# Patient Record
Sex: Female | Born: 1935 | Race: White | Hispanic: No | Smoking: Former smoker
Health system: Southern US, Community
[De-identification: ages and names within clinical notes are randomized; demographics above are authoritative.]

## PROBLEM LIST (undated history)

## (undated) DIAGNOSIS — K449 Diaphragmatic hernia without obstruction or gangrene: Secondary | ICD-10-CM

## (undated) DIAGNOSIS — I10 Essential (primary) hypertension: Secondary | ICD-10-CM

## (undated) DIAGNOSIS — Z87898 Personal history of other specified conditions: Secondary | ICD-10-CM

## (undated) DIAGNOSIS — E785 Hyperlipidemia, unspecified: Secondary | ICD-10-CM

## (undated) DIAGNOSIS — Z950 Presence of cardiac pacemaker: Secondary | ICD-10-CM

## (undated) DIAGNOSIS — H9191 Unspecified hearing loss, right ear: Secondary | ICD-10-CM

## (undated) DIAGNOSIS — K5730 Diverticulosis of large intestine without perforation or abscess without bleeding: Secondary | ICD-10-CM

## (undated) DIAGNOSIS — R0989 Other specified symptoms and signs involving the circulatory and respiratory systems: Secondary | ICD-10-CM

## (undated) DIAGNOSIS — C50919 Malignant neoplasm of unspecified site of unspecified female breast: Secondary | ICD-10-CM

## (undated) DIAGNOSIS — C4359 Malignant melanoma of other part of trunk: Secondary | ICD-10-CM

## (undated) DIAGNOSIS — I441 Atrioventricular block, second degree: Secondary | ICD-10-CM

## (undated) HISTORY — DX: Personal history of other specified conditions: Z87.898

## (undated) HISTORY — DX: Atrioventricular block, second degree: I44.1

## (undated) HISTORY — DX: Diverticulosis of large intestine without perforation or abscess without bleeding: K57.30

## (undated) HISTORY — DX: Other specified symptoms and signs involving the circulatory and respiratory systems: R09.89

## (undated) HISTORY — DX: Essential (primary) hypertension: I10

## (undated) HISTORY — DX: Unspecified hearing loss, right ear: H91.91

## (undated) HISTORY — DX: Hyperlipidemia, unspecified: E78.5

## (undated) HISTORY — DX: Diaphragmatic hernia without obstruction or gangrene: K44.9

## (undated) HISTORY — PX: OTHER SURGICAL HISTORY: SHX169

## (undated) HISTORY — DX: Malignant melanoma of other part of trunk: C43.59

## (undated) HISTORY — DX: Presence of cardiac pacemaker: Z95.0

## (undated) HISTORY — PX: INSERT / REPLACE / REMOVE PACEMAKER: SUR710

---

## 1958-02-27 HISTORY — PX: BREAST BIOPSY: SHX20

## 1958-02-27 HISTORY — PX: BREAST LUMPECTOMY: SHX2

## 1975-02-28 HISTORY — PX: OTHER SURGICAL HISTORY: SHX169

## 1997-07-14 ENCOUNTER — Other Ambulatory Visit: Admission: RE | Admit: 1997-07-14 | Discharge: 1997-07-14 | Payer: Self-pay | Admitting: Internal Medicine

## 1997-07-14 LAB — HM PAP SMEAR

## 1999-02-28 HISTORY — PX: CATARACT EXTRACTION, BILATERAL: SHX1313

## 1999-06-30 ENCOUNTER — Encounter: Admission: RE | Admit: 1999-06-30 | Discharge: 1999-06-30 | Payer: Self-pay | Admitting: Internal Medicine

## 1999-06-30 ENCOUNTER — Encounter: Payer: Self-pay | Admitting: Internal Medicine

## 1999-08-09 ENCOUNTER — Ambulatory Visit (HOSPITAL_COMMUNITY): Admission: RE | Admit: 1999-08-09 | Discharge: 1999-08-09 | Payer: Self-pay | Admitting: Internal Medicine

## 1999-08-09 ENCOUNTER — Encounter: Payer: Self-pay | Admitting: Internal Medicine

## 2000-02-28 HISTORY — PX: CATARACT EXTRACTION: SUR2

## 2000-03-15 ENCOUNTER — Encounter: Admission: RE | Admit: 2000-03-15 | Discharge: 2000-03-15 | Payer: Self-pay | Admitting: Internal Medicine

## 2000-03-15 ENCOUNTER — Encounter: Payer: Self-pay | Admitting: Internal Medicine

## 2000-05-10 ENCOUNTER — Other Ambulatory Visit: Admission: RE | Admit: 2000-05-10 | Discharge: 2000-05-10 | Payer: Self-pay | Admitting: Internal Medicine

## 2000-05-10 ENCOUNTER — Encounter: Payer: Self-pay | Admitting: Internal Medicine

## 2000-05-10 ENCOUNTER — Encounter (INDEPENDENT_AMBULATORY_CARE_PROVIDER_SITE_OTHER): Payer: Self-pay | Admitting: Specialist

## 2000-05-10 DIAGNOSIS — K648 Other hemorrhoids: Secondary | ICD-10-CM | POA: Insufficient documentation

## 2000-05-10 DIAGNOSIS — D126 Benign neoplasm of colon, unspecified: Secondary | ICD-10-CM | POA: Insufficient documentation

## 2000-08-13 ENCOUNTER — Encounter: Admission: RE | Admit: 2000-08-13 | Discharge: 2000-08-13 | Payer: Self-pay | Admitting: Internal Medicine

## 2000-08-13 ENCOUNTER — Encounter: Payer: Self-pay | Admitting: Internal Medicine

## 2001-10-17 ENCOUNTER — Encounter: Admission: RE | Admit: 2001-10-17 | Discharge: 2001-10-17 | Payer: Self-pay | Admitting: Internal Medicine

## 2001-10-17 ENCOUNTER — Encounter: Payer: Self-pay | Admitting: Internal Medicine

## 2002-12-04 ENCOUNTER — Encounter: Admission: RE | Admit: 2002-12-04 | Discharge: 2002-12-04 | Payer: Self-pay | Admitting: Internal Medicine

## 2002-12-04 ENCOUNTER — Encounter: Payer: Self-pay | Admitting: Internal Medicine

## 2003-01-12 ENCOUNTER — Encounter: Admission: RE | Admit: 2003-01-12 | Discharge: 2003-01-12 | Payer: Self-pay | Admitting: Internal Medicine

## 2003-11-19 ENCOUNTER — Ambulatory Visit (HOSPITAL_COMMUNITY): Admission: RE | Admit: 2003-11-19 | Discharge: 2003-11-19 | Payer: Self-pay | Admitting: Internal Medicine

## 2003-11-20 ENCOUNTER — Encounter: Admission: RE | Admit: 2003-11-20 | Discharge: 2003-11-20 | Payer: Self-pay | Admitting: Internal Medicine

## 2004-01-28 ENCOUNTER — Encounter: Admission: RE | Admit: 2004-01-28 | Discharge: 2004-01-28 | Payer: Self-pay | Admitting: Internal Medicine

## 2004-02-28 HISTORY — PX: PACEMAKER INSERTION: SHX728

## 2004-04-01 ENCOUNTER — Ambulatory Visit (HOSPITAL_COMMUNITY): Admission: RE | Admit: 2004-04-01 | Discharge: 2004-04-02 | Payer: Self-pay | Admitting: Cardiology

## 2004-04-01 DIAGNOSIS — Z950 Presence of cardiac pacemaker: Secondary | ICD-10-CM

## 2004-04-01 HISTORY — DX: Presence of cardiac pacemaker: Z95.0

## 2005-02-16 ENCOUNTER — Encounter: Admission: RE | Admit: 2005-02-16 | Discharge: 2005-02-16 | Payer: Self-pay | Admitting: Internal Medicine

## 2005-07-12 ENCOUNTER — Encounter: Admission: RE | Admit: 2005-07-12 | Discharge: 2005-07-12 | Payer: Self-pay | Admitting: Cardiology

## 2006-01-15 ENCOUNTER — Ambulatory Visit: Payer: Self-pay | Admitting: Internal Medicine

## 2006-01-15 LAB — CONVERTED CEMR LAB
ALT: 18 units/L (ref 0–40)
AST: 19 units/L (ref 0–37)
Albumin: 4.1 g/dL (ref 3.5–5.2)
Alkaline Phosphatase: 66 units/L (ref 39–117)
BUN: 24 mg/dL — ABNORMAL HIGH (ref 6–23)
Basophils Absolute: 0.1 10*3/uL (ref 0.0–0.1)
Basophils Relative: 0.7 % (ref 0.0–1.0)
Bilirubin Urine: NEGATIVE
CO2: 31 meq/L (ref 19–32)
Calcium: 9.4 mg/dL (ref 8.4–10.5)
Chloride: 103 meq/L (ref 96–112)
Creatinine, Ser: 1 mg/dL (ref 0.4–1.2)
Crystals: NEGATIVE
Eosinophil percent: 1.1 % (ref 0.0–5.0)
GFR calc non Af Amer: 56 mL/min
Glomerular Filtration Rate, Af Am: 67 mL/min/{1.73_m2}
Glucose, Bld: 94 mg/dL (ref 70–99)
HCT: 44.4 % (ref 36.0–46.0)
Hemoglobin: 14.7 g/dL (ref 12.0–15.0)
Ketones, ur: NEGATIVE mg/dL
Lipase: 29 units/L (ref 11.0–59.0)
Lymphocytes Relative: 20.3 % (ref 12.0–46.0)
MCHC: 33.2 g/dL (ref 30.0–36.0)
MCV: 89.5 fL (ref 78.0–100.0)
Monocytes Absolute: 0.6 10*3/uL (ref 0.2–0.7)
Monocytes Relative: 8.3 % (ref 3.0–11.0)
Mucus, UA: NEGATIVE
Neutro Abs: 5.3 10*3/uL (ref 1.4–7.7)
Neutrophils Relative %: 69.6 % (ref 43.0–77.0)
Nitrite: NEGATIVE
Platelets: 207 10*3/uL (ref 150–400)
Potassium: 3.7 meq/L (ref 3.5–5.1)
RBC: 4.96 M/uL (ref 3.87–5.11)
RDW: 12.5 % (ref 11.5–14.6)
Sodium: 143 meq/L (ref 135–145)
Specific Gravity, Urine: 1.02 (ref 1.000–1.03)
Total Bilirubin: 0.9 mg/dL (ref 0.3–1.2)
Total Protein, Urine: NEGATIVE mg/dL
Total Protein: 6.7 g/dL (ref 6.0–8.3)
Urine Glucose: NEGATIVE mg/dL
Urobilinogen, UA: 0.2 (ref 0.0–1.0)
WBC: 7.6 10*3/uL (ref 4.5–10.5)
pH: 5.5 (ref 5.0–8.0)

## 2006-01-23 ENCOUNTER — Ambulatory Visit: Payer: Self-pay | Admitting: Gastroenterology

## 2006-01-31 ENCOUNTER — Ambulatory Visit: Payer: Self-pay | Admitting: Internal Medicine

## 2006-02-05 ENCOUNTER — Ambulatory Visit: Payer: Self-pay | Admitting: Cardiology

## 2006-02-22 ENCOUNTER — Encounter: Admission: RE | Admit: 2006-02-22 | Discharge: 2006-02-22 | Payer: Self-pay | Admitting: Internal Medicine

## 2006-02-22 ENCOUNTER — Ambulatory Visit: Payer: Self-pay | Admitting: Internal Medicine

## 2006-03-09 ENCOUNTER — Ambulatory Visit (HOSPITAL_COMMUNITY): Admission: RE | Admit: 2006-03-09 | Discharge: 2006-03-09 | Payer: Self-pay | Admitting: Internal Medicine

## 2006-04-30 ENCOUNTER — Ambulatory Visit: Payer: Self-pay | Admitting: Internal Medicine

## 2006-07-20 ENCOUNTER — Encounter: Admission: RE | Admit: 2006-07-20 | Discharge: 2006-07-20 | Payer: Self-pay | Admitting: Cardiology

## 2006-08-07 ENCOUNTER — Ambulatory Visit: Payer: Self-pay | Admitting: Internal Medicine

## 2006-08-07 LAB — CONVERTED CEMR LAB
BUN: 30 mg/dL — ABNORMAL HIGH (ref 6–23)
Creatinine, Ser: 1 mg/dL (ref 0.4–1.2)

## 2006-08-08 ENCOUNTER — Ambulatory Visit: Payer: Self-pay | Admitting: Cardiology

## 2006-08-21 ENCOUNTER — Ambulatory Visit: Payer: Self-pay | Admitting: Internal Medicine

## 2007-02-28 DIAGNOSIS — C4359 Malignant melanoma of other part of trunk: Secondary | ICD-10-CM

## 2007-02-28 HISTORY — DX: Malignant melanoma of other part of trunk: C43.59

## 2007-06-13 ENCOUNTER — Ambulatory Visit: Payer: Self-pay | Admitting: Surgery

## 2007-06-13 ENCOUNTER — Ambulatory Visit (HOSPITAL_COMMUNITY): Admission: RE | Admit: 2007-06-13 | Discharge: 2007-06-13 | Payer: Self-pay | Admitting: Internal Medicine

## 2007-06-13 ENCOUNTER — Encounter: Payer: Self-pay | Admitting: Internal Medicine

## 2007-06-21 DIAGNOSIS — K449 Diaphragmatic hernia without obstruction or gangrene: Secondary | ICD-10-CM | POA: Insufficient documentation

## 2007-06-21 DIAGNOSIS — E7800 Pure hypercholesterolemia, unspecified: Secondary | ICD-10-CM | POA: Insufficient documentation

## 2007-06-21 DIAGNOSIS — K5730 Diverticulosis of large intestine without perforation or abscess without bleeding: Secondary | ICD-10-CM | POA: Insufficient documentation

## 2007-10-29 ENCOUNTER — Encounter: Admission: RE | Admit: 2007-10-29 | Discharge: 2007-10-29 | Payer: Self-pay | Admitting: Internal Medicine

## 2008-02-11 ENCOUNTER — Ambulatory Visit: Payer: Self-pay | Admitting: Internal Medicine

## 2008-06-22 IMAGING — US US ABDOMEN COMPLETE
1 series · 14 of 25 positions shown · non-contrast
Comparison: none

[Series 1: us abdomen complete · 0.18mm/px · 14 of 55 slices shown]
[im 1/55]
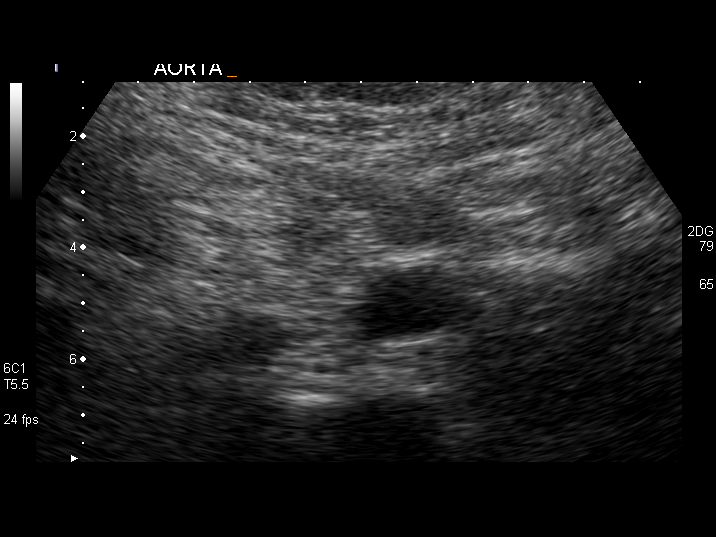
[im 5/55]
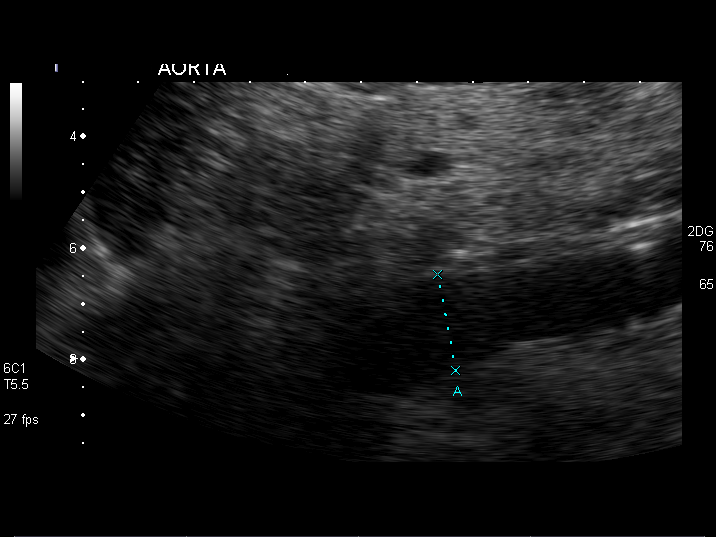
[im 10/55]
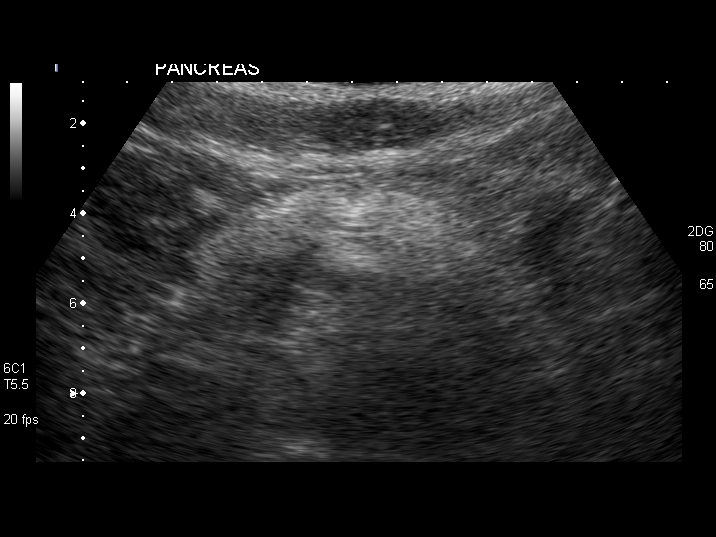
[im 14/55]
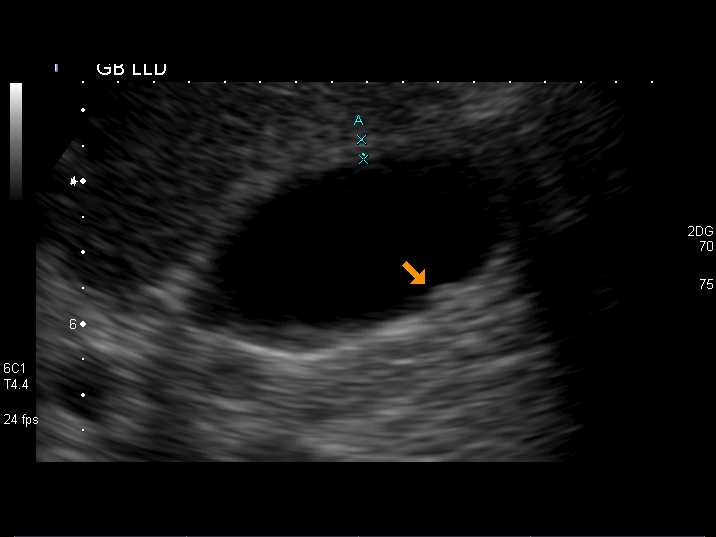
[im 19/55]
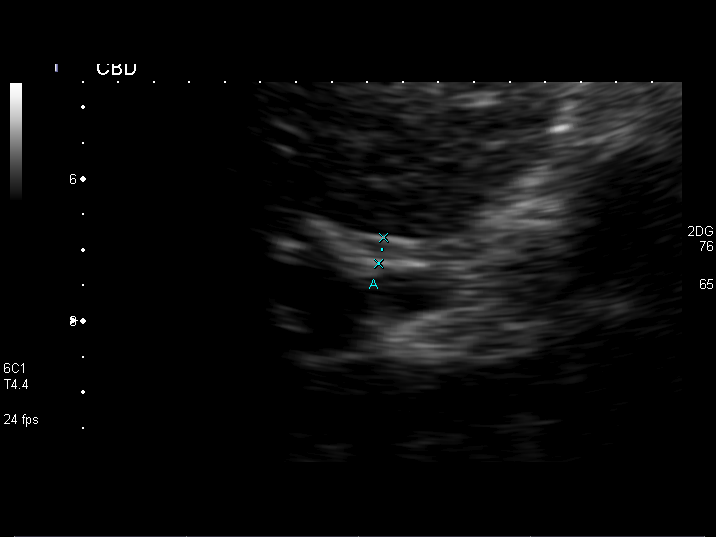
[im 21/55]
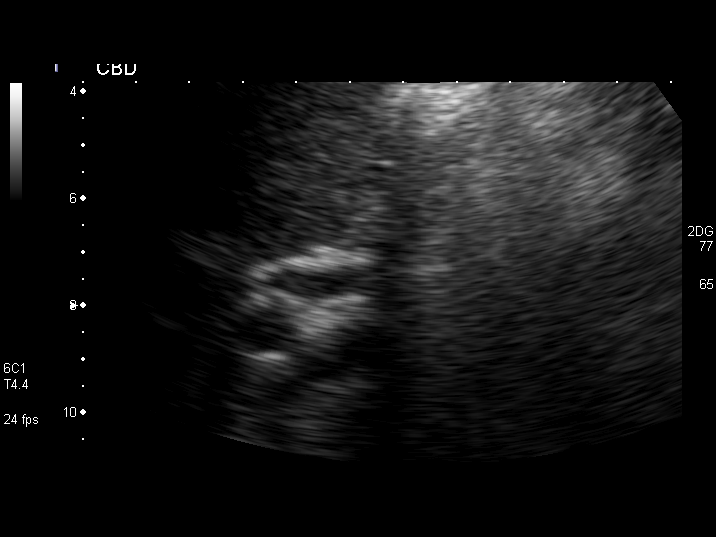
[im 25/55]
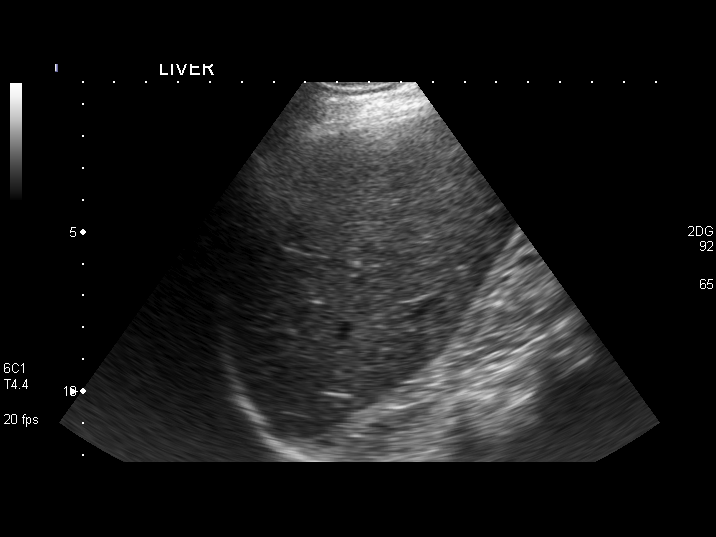
[im 30/55]
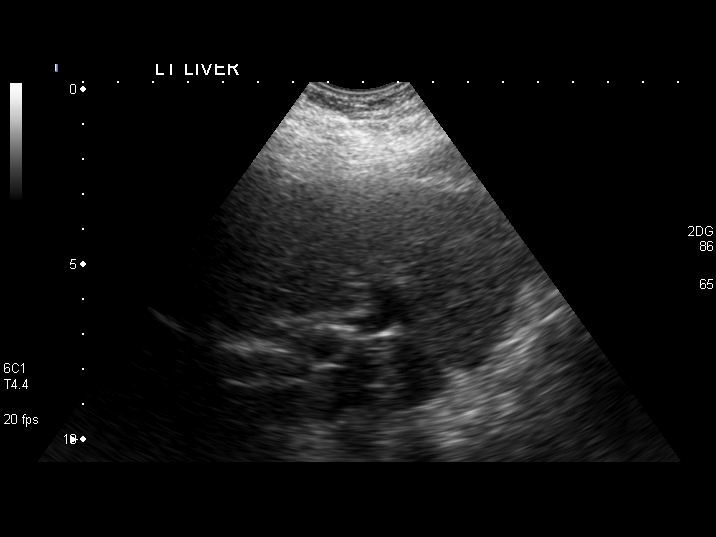
[im 34/55]
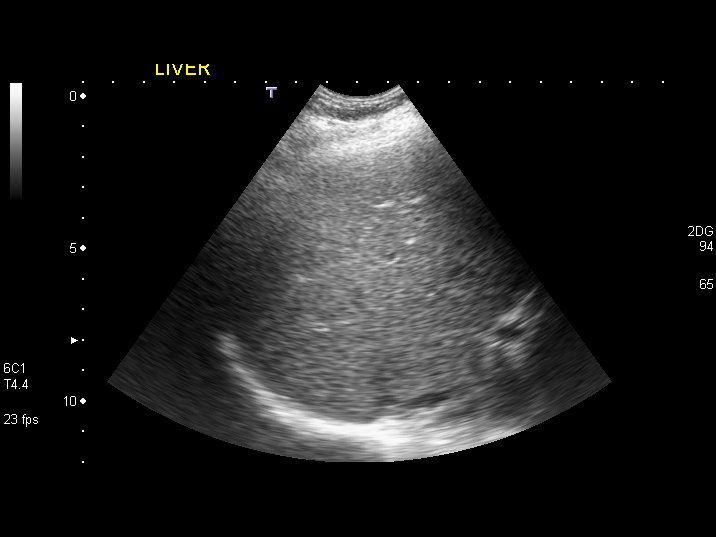
[im 37/55]
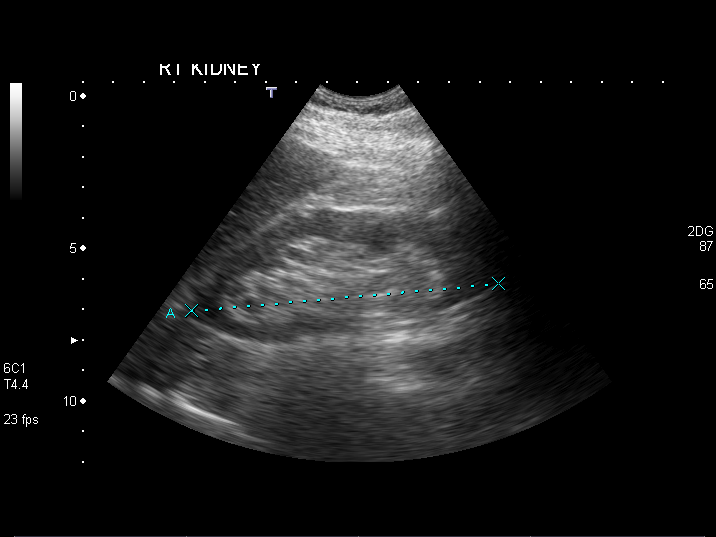
[im 41/55]
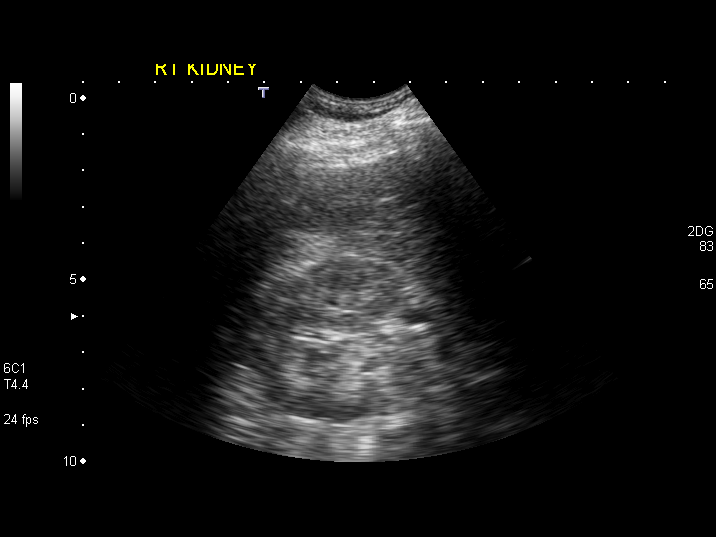
[im 46/55]
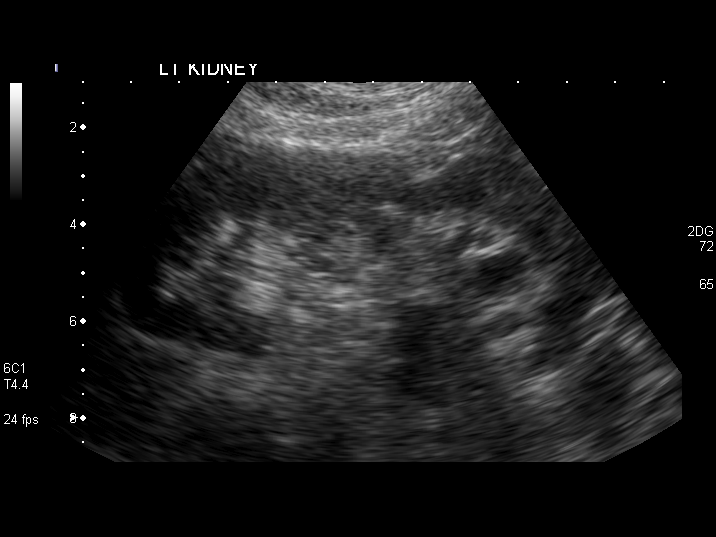
[im 50/55]
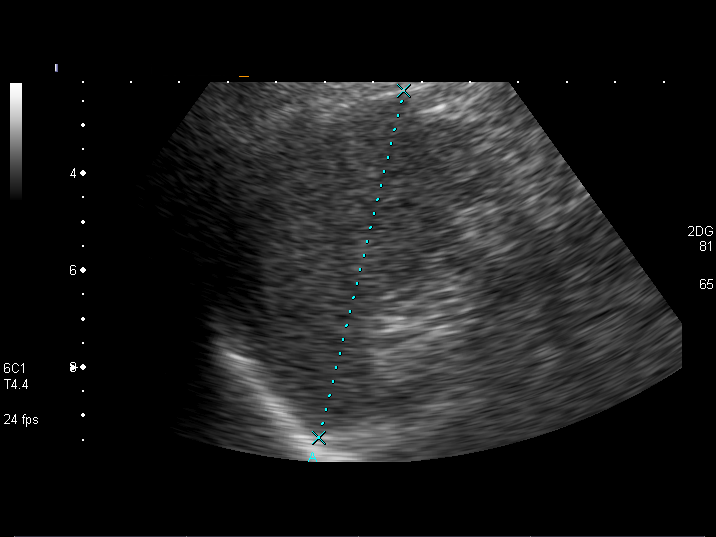
[im 55/55]
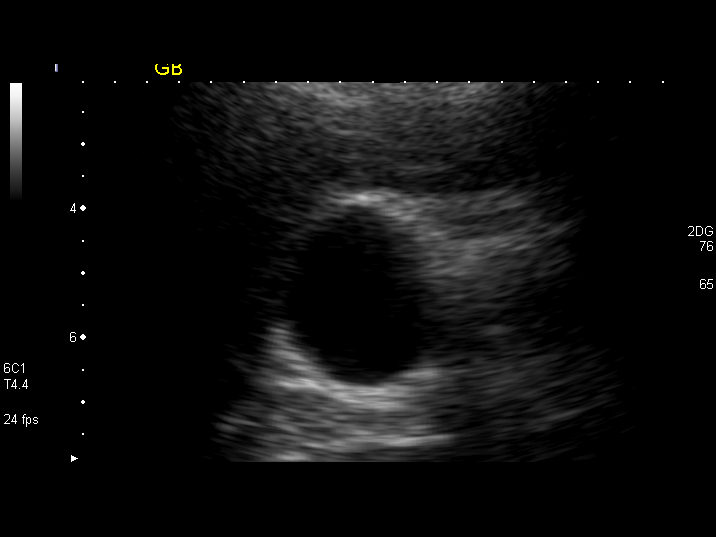

[14 of 25 positions shown; findings below may reference images not displayed]

ACCESSION NUMBER:  [id number]

 READING PHYSICIAN:  Sebas, Latonya

 PROCEDURE:  Multiplanar abdominal ultrasound imaging was performed in the upright, supine, right and left lateral decubitus positions.

 RESULTS
 Abdominal aorta measures 1.9 cm in maximal diameter.  The IVC is patent. 

 The pancreas appears normal throughout the head, body and tail without evidence of ductal dilatation, pancreatic masses, or peripancreatic inflammation.  

 In the gallbladder, a small amount of nonshadowing sludge is noted.  The wall thickness measures at 2.8 mm.  Gallbladder is well distended, thin walled, with no pericholecystic fluid or intraluminal echogenic foci to suggest gallstone disease.  

 The common bile duct measures 7.2 mm in maximal diameter without evidence of intraluminal foci.

 The liver appears normal without evidence of parenchymal lesion, ductal dilatation or vascular abnormality.

 The right kidney measures 10.1 cm, the left kidney 9.2 cm.  Kidneys are normal in appearance.

 Spleen is normal in size, measuring 7.4 cm without parenchymal lesion.

 IMPRESSION 
 Small amount of gallbladder sludge.

## 2008-07-21 ENCOUNTER — Encounter: Admission: RE | Admit: 2008-07-21 | Discharge: 2008-07-21 | Payer: Self-pay | Admitting: Cardiology

## 2008-08-11 ENCOUNTER — Ambulatory Visit: Payer: Self-pay | Admitting: Internal Medicine

## 2008-11-20 ENCOUNTER — Encounter: Admission: RE | Admit: 2008-11-20 | Discharge: 2008-11-20 | Payer: Self-pay | Admitting: Internal Medicine

## 2008-12-24 ENCOUNTER — Ambulatory Visit: Payer: Self-pay | Admitting: Internal Medicine

## 2009-02-08 ENCOUNTER — Ambulatory Visit: Payer: Self-pay | Admitting: Internal Medicine

## 2009-04-20 ENCOUNTER — Ambulatory Visit: Payer: Self-pay | Admitting: Internal Medicine

## 2009-10-19 ENCOUNTER — Ambulatory Visit: Payer: Self-pay | Admitting: Internal Medicine

## 2009-12-21 ENCOUNTER — Encounter: Admission: RE | Admit: 2009-12-21 | Discharge: 2009-12-21 | Payer: Self-pay | Admitting: Internal Medicine

## 2009-12-21 LAB — HM MAMMOGRAPHY

## 2010-03-19 ENCOUNTER — Encounter: Payer: Self-pay | Admitting: Internal Medicine

## 2010-03-20 ENCOUNTER — Encounter: Payer: Self-pay | Admitting: Internal Medicine

## 2010-04-19 ENCOUNTER — Other Ambulatory Visit: Payer: Self-pay | Admitting: Internal Medicine

## 2010-04-21 ENCOUNTER — Encounter: Payer: Self-pay | Admitting: Internal Medicine

## 2010-04-21 DIAGNOSIS — I10 Essential (primary) hypertension: Secondary | ICD-10-CM

## 2010-04-21 DIAGNOSIS — M159 Polyosteoarthritis, unspecified: Secondary | ICD-10-CM

## 2010-04-21 DIAGNOSIS — E785 Hyperlipidemia, unspecified: Secondary | ICD-10-CM

## 2010-05-19 ENCOUNTER — Encounter: Payer: Self-pay | Admitting: Internal Medicine

## 2010-05-23 ENCOUNTER — Encounter: Payer: Self-pay | Admitting: Internal Medicine

## 2010-07-12 NOTE — Assessment & Plan Note (Signed)
[facility] OFFICE NOTE   NAME:GREENIisha, [place]                        MRN:          [id number]  DATE:08/21/2006                            DOB:          1916/10/31    HISTORY:  Ms. Goudeau presents today for followup.  She is an 77 year old  who has been seen for problems with intermittent nocturnal abdominal  discomfort.  She is also known to have a small cystic lesion of the  pancreas which has not been characterized.  For her discomfort, she uses  Darvocet sparingly.  This helps.  Her last office visit was in early  March of 2008.  Since that time, she has been stable.  She continues to  have intermittent nocturnal abdominal discomfort that she describes as a  dull pain.  No other symptoms such as nausea, vomiting, diarrhea, fever,  bleeding.  Her appetite and weight remain stable.  As per our previous  plan, she underwent a followup CT scan August 08, 2006.  This revealed the  cystic lesion of the pancreas to be stable.  It currently measures 2.8  cm x 1.4 cm.  The main pancreatic duct is not dilated.  She is wondering  whether her symptoms may be due to Fosamax.   CURRENT MEDICATIONS:  Fosamax, aspirin, pravastatin,  hydrochlorothiazide, lisinopril, glucosamine, calcium, and stool  softeners.  She also uses MiraLax p.r.n., Tylenol p.r.n., Levsin p.r.n.,  and Zantac p.r.n., as well as Darvocet p.r.n.   PHYSICAL EXAMINATION:  Well-appearing female in no acute distress.  Blood pressure is 156/64.  Heart rate is 80 and regular.  Weight is  132.2 pounds.  HEENT:  Sclerae are anicteric.  Conjunctivae are pink.  LUNGS:  Clear.  HEART:  Regular.  ABDOMEN:  Soft without tenderness, mass, or hernia.   IMPRESSION:  Stable intermittent abdominal discomfort possibly related  to small cystic lesion of the pancreas.  She is clinically and  radiographically stable.  We again discussed possible etiologies as well  as the  prospects of endoscopic ultrasound guided aspiration to provide  additional diagnostic information.  Given her age and the location of  the cyst, we are both keen on continuing with conservative management  and Darvocet as needed for pain.  I would like to see her back in the  office in about 6 months with probable plans for re-imaging the  pancreas.  Certainly, if her pain worsens or increases in frequency, in  the interim, she knows to contact the office.     Docia Chuck. Henrene Pastor, MD  Electronically Signed    JNP/MedQ  DD: 08/21/2006  DT: 08/22/2006  Job #: [id number]   cc:   Cresenciano Lick. Renold Genta, M.D.

## 2010-07-15 NOTE — Cardiovascular Report (Signed)
[name], [name] [name]                 ACCOUNT NO.:  [id number]   MEDICAL RECORD NO.:  [id number]          PATIENT TYPE:  OIB   LOCATION:  3703                         FACILITY:  [facility]   PHYSICIAN:  Ludwig Lean. Doreatha Lew, M.D.DATE OF BIRTH:  02-Apr-1916   DATE OF PROCEDURE:  DATE OF DISCHARGE:                              CARDIAC CATHETERIZATION   INDICATION:  Syncope with Mobitz 2 heart block, documented by monitoring.   PROCEDURE:  Implantation of a dual-chamber pulse generator under  fluoroscopy.   DESCRIPTION OF PROCEDURE:  The left subclavicular area was prepped and  draped.  The area was infiltrated with 1% Xylocaine.  A subcutaneous pocket  was created at the prepectoral fascia.  Two punctures were made into the  subclavian vein medial to the first rib.  Using 7-[facility] introducers,  atrial and ventricular leads were introduced.  The ventricular lead was a  Guidant model 4470 52 cm bipolar lead, serial number [id number].  The  ventricular threshold was 0.8 volts.  The capture 1.0 MA current was 0.5 ms  pulse width.  Impedance was 936 ohms.  R waves were 11.7 millivolts.  The  atrial lead was a Guidant bipolar active fixation lead, serial [id number].  It was a Model 4469 lead.  The atrial thresholds were 0.6 volts to capture,  1.4 MA current with a 0.5 ms pulse width.  Impedance was 482 ohms.  P waves  were 1.9 millivolts.  The leads were sutured in place.  The wound was  flushed with Kanamycin solution.  The leads were connected to a Medtronic  EnPulse Model number [id number], serial number PNB [id number].  The unit was  sutured into place.  The wound was closed with 2-O and subsequently 5-O  Dexon and Steri-Strips were applied.  The patient tolerated the procedure  well.      SNT/MEDQ  D:  04/01/2004  T:  04/02/2004  Job:  [id number]   cc:   Kerry Hough., M.D.  [id number] [address] [place]  [place] [address]  Fax: [phone]

## 2010-07-15 NOTE — H&P (Signed)
NAME:  [name], [name]                     ACCOUNT NO.:  0   MEDICAL RECORD NO.:  [id number]             PATIENT TYPE:   LOCATION:                                 FACILITY:   PHYSICIAN:  Ludwig Lean. Doreatha Lew, M.D.DATE OF BIRTH:  28-Feb-1916   DATE OF ADMISSION:  04/01/2004  DATE OF DISCHARGE:                                HISTORY & PHYSICAL   CHIEF COMPLAINT:  Syncope in the setting of second degree AV block.   HISTORY OF PRESENT ILLNESS:  [name] [name] is a very delightful 77 year old  female who has had recurrent episodes of syncope.  She has had in the past a  Holter monitor which was basically unremarkable.  After having her second  episode of syncope, however, she had an event monitor placed which showed  PACs as well as second degree AV block.  She is now referred for elective  permanent pacemaker implantation.  She does note that over the course of the  past month she has felt somewhat more weak and fatigued.   PAST MEDICAL HISTORY:  1.  Hypertension.  2.  Hyperlipidemia.  3.  History of osteoporosis.  4.  Past history of syncope.  5.  Breast lumpectomy.  6.  Remote history of ear surgery.  7.  Cataract extraction.   ALLERGIES:  CODEINE.   CURRENT MEDICINES INCLUDE:  1.  Pravachol 20 mg daily.  2.  Lisinopril 10 mg daily.  3.  Hydrochlorothiazide 12.5 daily.  4.  Baby aspirin daily.  5.  Calcium daily.  6.  Glucosamine daily.   FAMILY HISTORY:  Both of her parents are deceased.  Father died at age 72  with kidney disease, mother died at age 81 following a stroke.   SOCIAL HISTORY:  She is a widow, she lives at [facility].  She has 3 sons  who are alive and well.   REVIEW OF SYSTEMS:  Is basically as noted above and is otherwise  unremarkable.   On exam, she is a very pleasant elderly female who appears younger than her  stated age.  Blood pressure is 150/80 sitting, 160/80 standing, heart rate  is 88 and regular.  Her weight is 140 pounds.  Skin is warm and dry.   Color  is unremarkable.  LUNGS:  Are basically.  HEART:  Shows a regular rhythm.  ABDOMEN:  Soft, positive bowel sounds, nontender.  EXTREMITIES:  Without edema.  NEUROLOGIC:  Intact.  There are no gross focal deficits.   Pertinent labs are pending.   OVERALL IMPRESSION:  1.  Recurrent episodes of syncope.  2.  Documented second degree atrioventricular block.  3.  Hypertension.  4.  Hyperlipidemia.   PLAN:  Will proceed on with elective pacemaker implantation.  The procedure  has been reviewed in full detail and she is willing to proceed on Friday,  April 01, 2004.      LC/MEDQ  D:  03/29/2004  T:  03/29/2004  Job:  [id number]   cc:   Cresenciano Lick. Renold Genta, M.D.  [address] [place]  [place]  [place] [address]  Fax: [place] Doristine Church., M.D.  [id number] [address] [place]  [place] [address]  Fax: [phone]   Doyle Askew, [place].P.

## 2010-07-15 NOTE — Assessment & Plan Note (Signed)
[facility] OFFICE NOTE   NAME:GREENKendis, [name] [name]                        MRN:          [id number]  DATE:04/30/2006                            DOB:          March 05, 1916    HISTORY:  [name] [name] presents today for followup.  She is an 77 year old  with a history of hypertension, hyperlipidemia, and colon polyps who was  evaluated in November for abdominal pain.  She has undergone extensive  workup as previously outlined.  The two objective abnormalities  identified are gallbladder sludge for which she underwent a HIDA scan.  This was entirely normal.  As well a 2.8-cm cystic lesion of the  pancreatic head and uncinate process.  She presents today for followup.  She was given Darvocet for pain which she has used approximately 10 of  since mid December.  She states that her symptoms are no worse, possibly  they are better.  However, she continues with some intermittent  nocturnal discomfort in the upper abdomen.  No new symptoms or problems.   MEDICATIONS:  Unchanged.   PHYSICAL EXAMINATION:  GENERAL:  Finds a well-appearing female in no  acute distress.  VITAL SIGNS:  Blood pressure 122/74, heart rate is 82, weight is 132.4  pounds (decreased 2.6 pounds).  ABDOMEN:  Soft without tenderness, mass, or hernia.  Good bowel sounds  heard.   IMPRESSION:  At this point, the patient's pain may be related to the  cystic lesion of the pancreas,which is unlikely to represent a benign  pseudocyst.  This may, however, represent a mucinous neoplasm or serous  cystadenoma.  I discussed, again, with her today the implications of  this particular diagnosis as well further workup for characterization.  In particular, endoscopic ultrasound with cyst aspiration and fluid  analysis.  I also discussed with her the potential for developing  malignancy in some of these lesions as well ultimate therapy (i.e.,  surgery-- which in her case  would be Whipple procedure).  We did not  discuss possible alcohol ablation as this is not available locally and  is still investigational.  After a full discussion, the patient states  that she wishes to wait and not persue further evaluation.  As such, I  have recommended a repeat CT scan and office visit in 3 months assuming  no significant interval problems.  Certainly, if she develops  significant interval problems she knows to contact the office.  She  should continue to use Darvocet as needed for comfort.     Docia Chuck. Henrene Pastor, MD  Electronically Signed    JNP/MedQ  DD: 04/30/2006  DT: 04/30/2006  Job #: [id number]   cc:   Cresenciano Lick. Renold Genta, M.D.

## 2010-07-15 NOTE — Assessment & Plan Note (Signed)
[facility] OFFICE NOTE   NAME:GREENMaye, [place]                        MRN:          [id number]  DATE:02/22/2006                            DOB:          March 14, 1916    HISTORY:  [name] [name] presents today for follow-up.  She is an 77 year old  with a history of hypertension, hyperlipidemia and colon polyps, who was  evaluated January 15, 2006, regarding recurrent nocturnal abdominal  discomfort of 6 months' duration.  See that dictation for details.  She  underwent laboratories including CBC, comprehensive metabolic panel,  amylase, lipase, and urinalysis.  These were unremarkable.  Next, an  abdominal ultrasound was performed and revealed gallbladder sludge.  No  other abnormalities.  The pancreas was said to be normal.  Upper  endoscopy performed on January 31, 2006, was also normal.  No evidence  of ulcer disease.  Because of persistent but not worsening symptoms, a  CT scan of the abdomen and pelvis was performed February 05, 2006.  This  revealed a cystic lesion in the region of the pancreatic head and  uncinate process.  This measured 2.8 x 1.3 cm.  The etiology was  uncertain, though it was felt that it may represent an intraductal  papillary mucinous tumor.  The patient presents today for follow-up.  She continues to have intermittent problems with nocturnal abdominal  discomfort, which had not changed either in terms of intensity or  frequency.  She has been using prescribed Darvocet sparingly.  This is  quite helpful.  No new problems.   Her medications are unchanged from her previous visit except for  Darvocet.   We have reviewed her multiple studies and tests in detail.   PHYSICAL EXAMINATION:  GENERAL:  A pleasant, well-appearing female in no  acute distress.  VITAL SIGNS:  Blood pressure is 124/68, heart rate is 90, weight is 135  pounds (increased one pound).  HEENT:  Sclerae are anicteric.  LUNGS:  Clear.  CARDIAC:  Heart is regular.  ABDOMEN:  Soft without tenderness, mass or hernia, good bowel sounds  heard.   IMPRESSION:  1. Stable problems with intermittent nocturnal abdominal discomfort.      I am still suspicious that this pain represents gallbladder      disease.  Though no stone is identified on her ultrasound, she did      have sludge.  2. Small cystic lesion of the pancreas as described above.  I      discussed with the patient the differential diagnosis, including      benign lesions of the pancreas, precancerous lesions of the      pancreas, and cancerous lesions of the pancreas.  We discussed      further ways to evaluate this problem including endoscopic      ultrasound with cyst aspiration.  However, at age 15, we both agree      that if we were not going to make clinical decisions based on      additional information, that an observation, for now, may be best.   RECOMMENDATIONS:  1.  Schedule a HIDA scan to assess gallbladder function.  If abnormal,      then refer the patient for consideration of laparoscopic      cholecystectomy.  2. If HIDA scan unrevealing, then continue to use Darvocet as needed      for pain with planned office follow-up here in 2 months.  She knows      to contact the office in the interim should her problem worsen or      change for the worse.  Otherwise, she will continue her general      medical care with Dr. Renold Genta.     Docia Chuck. Henrene Pastor, MD  Electronically Signed    JNP/MedQ  DD: 02/22/2006  DT: 02/22/2006  Job #: [id number]   cc:   Cresenciano Lick. Renold Genta, M.D.

## 2010-07-15 NOTE — Discharge Summary (Signed)
NAMESAILA, HAVLIK                 ACCOUNT NO.:  [id number]   MEDICAL RECORD NO.:  [id number]          PATIENT TYPE:  OIB   LOCATION:  3703                         FACILITY:  [facility]   PHYSICIAN:  Ludwig Lean. Doreatha Lew, M.D.DATE OF BIRTH:  March 18, 1916   DATE OF ADMISSION:  04/01/2004  DATE OF DISCHARGE:  04/02/2004                                 DISCHARGE SUMMARY   PRIMARY DISCHARGE DIAGNOSIS:  Syncope in the setting of documented secondary  A-V block with subsequent implantation of a Medtronic DDDR [id number] pacemaker,  serial number [id number].   SECONDARY DISCHARGE DIAGNOSES:  1.  Hypertension.  2.  Hyperlipidemia.  3.  Osteoporosis.   HISTORY OF PRESENT ILLNESS:  The patient is a very pleasant, delightful, 77-  year-old female who has had recurrent episodes of syncope.  She has  subsequently had second degree A-V block documented.  She was subsequently  referred for pacemaker implantation.  Please see the dictated history and  physical for further patient presentation and profile.   LABORATORY DATA:  On admission was basically satisfactory.   HOSPITAL COURSE:  The patient was admitted electively in order to undergo  pacemaker implantation.  A Medtronic DDDR [id number] pacemaker, serial number  [id number] was implanted.  The overall procedure was tolerated well without  any known complications.  Post procedure, she was transferred to 3700 and  today, on April 02, 2004, she is doing well without complaints.  The wound  is satisfactory.  She is currently in a sinus rhythm on the monitor but  overall is felt to be a stable candidate for discharge today, once chest x-  ray is complete and antibiotics are finished.   DISCHARGE CONDITION:  Stable.   DISCHARGE MEDICINES:  1.  She will resume all of her previous home medicines with no changes made.  2.  She may use Tylenol as needed for discomfort.   Extensive written instructions are given regarding pacemaker care;  specifically not to  raise the left arm above her head for the next two  weeks.  She is also to avoid getting the wound wet for the first week.  Betadine swabs are made available to her to paint the wound daily for the  next several days as well.  We will have her follow up with Dr. Viona Gilmore. Tollie Eth in one week, certainly sooner if problems arise.      LC/MEDQ  D:  04/02/2004  T:  04/02/2004  Job:  [id number]   cc:   Kerry Hough., M.D.  [id number] [address] [place]  [place] [address]  Fax: [phone]   Our office

## 2010-07-15 NOTE — Assessment & Plan Note (Signed)
[facility] OFFICE NOTE   NAME:[name] [name]                        MRN:          [id number]  DATE:01/15/2006                            DOB:          1916-04-19    REASON FOR CONSULTATION:  Abdominal pain.   HISTORY:  This is a pleasant 77 year old white female with a history of  hypertension, hyperlipidemia, and colon polyps who presents today through  the courtesy of Dr. Renold Genta regarding abdominal pain.  The patient was  evaluated in this office in 2002 for constipation, abdominal pain, and  bloating.  Complete colonoscopy was performed and revealed severe left-sided  diverticulosis, as well as diminutive ascending colon polyp, which was  removed and found to be a tubular adenoma.  She tells me that she was doing  well until May of this year when she began to develop problems with  abdominal pain.  She describes it as a Lal-apple belly ache.  The  patient states it occurs exclusively at night after lying down and will  occasionally wake the patient.  Discomfort tends to last for several hours.  She may have several episodes in a given week, or may go as long as 10 days  with no problems.  There is no associated nausea or vomiting.  It does not  seem to be affected by meals.  She was having some constipation, for which  she was treated with MiraLax.  The MiraLax relieved the constipation, though  had no effect on her mid abdominal pain.  The pain does not radiate.  She  denies any urinary complaints.  She has tried Levsin periodically with  variable results.  She was also having problems with indigestion and  heartburn, for which she is now taking Zantac.  This has helped.  There has  been no change in appetite and no weight loss.   PAST MEDICAL HISTORY:  1. Hypertension.  2. Hyperlipidemia.  3. Cardiac arrhythmia status post pacemaker placement.  4. Remote breast surgery for benign disease.  5.  Status post tonsillectomy.   FAMILY HISTORY:  No family history of gastrointestinal malignancy.   ALLERGIES:  No known drug allergies, though the patient is intolerant to  CODEINE, which causes nausea.   CURRENT MEDICATIONS:  1. Fosamax Plus D 70 mg weekly.  2. Aspirin 81 mg daily.  3. Pravastatin 20 mg daily.  4. Hydrochlorothiazide 12.5 mg daily.  5. Lisinopril 10 mg daily.  6. MSM plus glucosamine b.i.d.  7. Calcium plus vitamin D 600 mg b.i.d.  8. She also uses Zantac 75 p.r.n., Levsin sublingual 0.125 mg p.r.n.,      Tylenol p.r.n., and MiraLax p.r.n.   SOCIAL HISTORY:  The patient is widowed with 3 children.  She lives with  [place], [facility].  She does not smoke.  Occasionally  drinks wine.   REVIEW OF SYSTEMS:  Per diagnostic evaluation form.   PHYSICAL EXAM:  Pleasant, alert, well-appearing female in no acute distress.  Blood pressure 130/72, heart rate is 92 and regular, weight is 134 pounds.  She is  5 feet 3-1/2 inches in height.  HEENT:  Sclerae anicteric.  Conjunctivae are pink.  Oral mucosa intact.  No  adenopathy.  LUNGS:  Clear.  HEART:  Regular.  ABDOMEN:  Soft, nontender, nondistended with good bowel sounds.  No  organomegaly, mass, or hernia.  EXTREMITIES:  Without edema.   IMPRESSION:  1. Chronic abdominal pain.  The patient's symptoms are most compatible      with gallbladder disease.  Cannot rule out acid peptic disorder.  2. Gastroesophageal reflux disease without alarm symptoms.  3. Functional constipation improved with MiraLax.  4. Known diverticulosis.  5. History of adenomatous colon polyp in March of 2002.   RECOMMENDATIONS:  1. Laboratories today including CBC, comprehensive metabolic panel, and      lipase.  2. Urinalysis.  3. Abdominal ultrasound to rule out gall stones and further evaluate      abdominal pain.  4. Diagnostic upper endoscopy.  The nature of the procedure, as well as      the risks, benefits, and  alternatives were reviewed.  She understood      and agreed to proceed.  5. If the above workup unrevealing then schedule CT scan of the abdomen      and pelvis to exclude GI or non-GI causes for her pain.     Docia Chuck. Geri Seminole., MD  Electronically Signed    JNP/MedQ  DD: 01/15/2006  DT: 01/15/2006  Job #: [id number]   cc:   Cresenciano Lick. Renold Genta, M.D.

## 2010-10-21 ENCOUNTER — Encounter: Payer: Self-pay | Admitting: Internal Medicine

## 2010-10-25 ENCOUNTER — Ambulatory Visit (INDEPENDENT_AMBULATORY_CARE_PROVIDER_SITE_OTHER): Payer: Medicare Other | Admitting: Internal Medicine

## 2010-10-25 ENCOUNTER — Encounter: Payer: Self-pay | Admitting: Internal Medicine

## 2010-10-25 DIAGNOSIS — R0989 Other specified symptoms and signs involving the circulatory and respiratory systems: Secondary | ICD-10-CM

## 2010-10-25 DIAGNOSIS — Z950 Presence of cardiac pacemaker: Secondary | ICD-10-CM

## 2010-10-25 DIAGNOSIS — C4359 Malignant melanoma of other part of trunk: Secondary | ICD-10-CM

## 2010-10-25 DIAGNOSIS — M810 Age-related osteoporosis without current pathological fracture: Secondary | ICD-10-CM

## 2010-10-25 DIAGNOSIS — I10 Essential (primary) hypertension: Secondary | ICD-10-CM

## 2010-10-25 DIAGNOSIS — E785 Hyperlipidemia, unspecified: Secondary | ICD-10-CM

## 2010-10-25 HISTORY — DX: Other specified symptoms and signs involving the circulatory and respiratory systems: R09.89
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
## 2010-11-01 ENCOUNTER — Other Ambulatory Visit: Payer: Self-pay | Admitting: Internal Medicine

## 2011-01-04 ENCOUNTER — Other Ambulatory Visit: Payer: Self-pay | Admitting: Internal Medicine

## 2011-01-04 DIAGNOSIS — Z1231 Encounter for screening mammogram for malignant neoplasm of breast: Secondary | ICD-10-CM

## 2011-01-31 ENCOUNTER — Ambulatory Visit
Admission: RE | Admit: 2011-01-31 | Discharge: 2011-01-31 | Disposition: A | Discharge status: Home / Self Care | Payer: Medicare Other | Source: Ambulatory Visit | Attending: Internal Medicine | Admitting: Internal Medicine

## 2011-01-31 DIAGNOSIS — Z1231 Encounter for screening mammogram for malignant neoplasm of breast: Secondary | ICD-10-CM

## 2011-02-28 ENCOUNTER — Other Ambulatory Visit: Payer: Self-pay | Admitting: Internal Medicine

## 2011-04-27 DIAGNOSIS — E785 Hyperlipidemia, unspecified: Secondary | ICD-10-CM | POA: Diagnosis not present

## 2011-04-27 DIAGNOSIS — M818 Other osteoporosis without current pathological fracture: Secondary | ICD-10-CM | POA: Diagnosis not present

## 2011-04-27 DIAGNOSIS — I10 Essential (primary) hypertension: Secondary | ICD-10-CM | POA: Diagnosis not present

## 2011-04-28 ENCOUNTER — Encounter: Payer: Self-pay | Admitting: Internal Medicine

## 2011-05-02 ENCOUNTER — Encounter: Payer: Self-pay | Admitting: Internal Medicine

## 2011-05-02 ENCOUNTER — Ambulatory Visit (INDEPENDENT_AMBULATORY_CARE_PROVIDER_SITE_OTHER): Payer: Medicare Other | Admitting: Internal Medicine

## 2011-05-02 VITALS — BP 150/78 | HR 92 | Temp 96.5°F | Ht 62.0 in | Wt 138.0 lb

## 2011-05-02 DIAGNOSIS — Z0000 Encounter for general adult medical examination without abnormal findings: Secondary | ICD-10-CM

## 2011-05-02 DIAGNOSIS — Z23 Encounter for immunization: Secondary | ICD-10-CM | POA: Diagnosis not present

## 2011-05-02 DIAGNOSIS — Z1211 Encounter for screening for malignant neoplasm of colon: Secondary | ICD-10-CM

## 2011-05-02 DIAGNOSIS — H9190 Unspecified hearing loss, unspecified ear: Secondary | ICD-10-CM | POA: Diagnosis not present

## 2011-05-02 DIAGNOSIS — I10 Essential (primary) hypertension: Secondary | ICD-10-CM

## 2011-05-02 DIAGNOSIS — Z950 Presence of cardiac pacemaker: Secondary | ICD-10-CM | POA: Diagnosis not present

## 2011-05-02 DIAGNOSIS — E785 Hyperlipidemia, unspecified: Secondary | ICD-10-CM

## 2011-05-02 LAB — POCT URINALYSIS DIPSTICK
Glucose, UA: NEGATIVE
Ketones, UA: NEGATIVE
Leukocytes, UA: NEGATIVE
Protein, UA: NEGATIVE
Spec Grav, UA: 1.01

## 2011-05-08 ENCOUNTER — Encounter: Payer: Self-pay | Admitting: Internal Medicine

## 2011-05-15 DIAGNOSIS — E785 Hyperlipidemia, unspecified: Secondary | ICD-10-CM | POA: Diagnosis not present

## 2011-05-15 DIAGNOSIS — I441 Atrioventricular block, second degree: Secondary | ICD-10-CM | POA: Diagnosis not present

## 2011-05-15 DIAGNOSIS — Z950 Presence of cardiac pacemaker: Secondary | ICD-10-CM | POA: Diagnosis not present

## 2011-05-15 DIAGNOSIS — I10 Essential (primary) hypertension: Secondary | ICD-10-CM | POA: Diagnosis not present

## 2011-05-16 DIAGNOSIS — H35369 Drusen (degenerative) of macula, unspecified eye: Secondary | ICD-10-CM | POA: Diagnosis not present

## 2011-05-16 DIAGNOSIS — H353290 Exudative age-related macular degeneration, unspecified eye, stage unspecified: Secondary | ICD-10-CM | POA: Diagnosis not present

## 2011-05-16 DIAGNOSIS — H353190 Nonexudative age-related macular degeneration, unspecified eye, stage unspecified: Secondary | ICD-10-CM | POA: Diagnosis not present

## 2011-05-16 DIAGNOSIS — H31009 Unspecified chorioretinal scars, unspecified eye: Secondary | ICD-10-CM | POA: Diagnosis not present

## 2011-05-16 DIAGNOSIS — H35059 Retinal neovascularization, unspecified, unspecified eye: Secondary | ICD-10-CM | POA: Diagnosis not present

## 2011-05-18 DIAGNOSIS — H353290 Exudative age-related macular degeneration, unspecified eye, stage unspecified: Secondary | ICD-10-CM | POA: Diagnosis not present

## 2011-05-18 DIAGNOSIS — H35059 Retinal neovascularization, unspecified, unspecified eye: Secondary | ICD-10-CM | POA: Diagnosis not present

## 2011-05-26 DIAGNOSIS — H9190 Unspecified hearing loss, unspecified ear: Secondary | ICD-10-CM | POA: Insufficient documentation

## 2011-05-26 DIAGNOSIS — H9191 Unspecified hearing loss, right ear: Secondary | ICD-10-CM

## 2011-05-26 HISTORY — DX: Unspecified hearing loss, right ear: H91.91

## 2011-05-26 NOTE — Patient Instructions (Signed)
Continue same medications and return in 6 months 
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
## 2011-05-27 ENCOUNTER — Other Ambulatory Visit: Payer: Self-pay | Admitting: Internal Medicine

## 2011-06-27 DIAGNOSIS — Z85828 Personal history of other malignant neoplasm of skin: Secondary | ICD-10-CM | POA: Diagnosis not present

## 2011-06-27 DIAGNOSIS — L259 Unspecified contact dermatitis, unspecified cause: Secondary | ICD-10-CM | POA: Diagnosis not present

## 2011-06-27 DIAGNOSIS — D235 Other benign neoplasm of skin of trunk: Secondary | ICD-10-CM | POA: Diagnosis not present

## 2011-06-27 DIAGNOSIS — L821 Other seborrheic keratosis: Secondary | ICD-10-CM | POA: Diagnosis not present

## 2011-06-27 DIAGNOSIS — L570 Actinic keratosis: Secondary | ICD-10-CM | POA: Diagnosis not present

## 2011-06-29 DIAGNOSIS — H353290 Exudative age-related macular degeneration, unspecified eye, stage unspecified: Secondary | ICD-10-CM | POA: Diagnosis not present

## 2011-06-29 DIAGNOSIS — H35059 Retinal neovascularization, unspecified, unspecified eye: Secondary | ICD-10-CM | POA: Diagnosis not present

## 2011-07-25 ENCOUNTER — Encounter: Payer: Self-pay | Admitting: Cardiology

## 2011-07-25 DIAGNOSIS — I441 Atrioventricular block, second degree: Secondary | ICD-10-CM | POA: Diagnosis not present

## 2011-07-25 DIAGNOSIS — I10 Essential (primary) hypertension: Secondary | ICD-10-CM | POA: Diagnosis not present

## 2011-07-25 DIAGNOSIS — E785 Hyperlipidemia, unspecified: Secondary | ICD-10-CM | POA: Diagnosis not present

## 2011-07-25 DIAGNOSIS — Z950 Presence of cardiac pacemaker: Secondary | ICD-10-CM | POA: Diagnosis not present

## 2011-08-02 DIAGNOSIS — H35059 Retinal neovascularization, unspecified, unspecified eye: Secondary | ICD-10-CM | POA: Diagnosis not present

## 2011-08-02 DIAGNOSIS — H353290 Exudative age-related macular degeneration, unspecified eye, stage unspecified: Secondary | ICD-10-CM | POA: Diagnosis not present

## 2011-08-21 DIAGNOSIS — Z950 Presence of cardiac pacemaker: Secondary | ICD-10-CM | POA: Diagnosis not present

## 2011-08-21 DIAGNOSIS — I10 Essential (primary) hypertension: Secondary | ICD-10-CM | POA: Diagnosis not present

## 2011-08-21 DIAGNOSIS — E785 Hyperlipidemia, unspecified: Secondary | ICD-10-CM | POA: Diagnosis not present

## 2011-08-21 DIAGNOSIS — I441 Atrioventricular block, second degree: Secondary | ICD-10-CM | POA: Diagnosis not present

## 2011-09-13 DIAGNOSIS — H353290 Exudative age-related macular degeneration, unspecified eye, stage unspecified: Secondary | ICD-10-CM | POA: Diagnosis not present

## 2011-09-13 DIAGNOSIS — H35059 Retinal neovascularization, unspecified, unspecified eye: Secondary | ICD-10-CM | POA: Diagnosis not present

## 2011-09-13 DIAGNOSIS — H3570 Unspecified separation of retinal layers: Secondary | ICD-10-CM | POA: Diagnosis not present

## 2011-10-18 DIAGNOSIS — H353290 Exudative age-related macular degeneration, unspecified eye, stage unspecified: Secondary | ICD-10-CM | POA: Diagnosis not present

## 2011-10-18 DIAGNOSIS — H35059 Retinal neovascularization, unspecified, unspecified eye: Secondary | ICD-10-CM | POA: Diagnosis not present

## 2011-11-21 DIAGNOSIS — H35059 Retinal neovascularization, unspecified, unspecified eye: Secondary | ICD-10-CM | POA: Diagnosis not present

## 2011-11-21 DIAGNOSIS — H353290 Exudative age-related macular degeneration, unspecified eye, stage unspecified: Secondary | ICD-10-CM | POA: Diagnosis not present

## 2011-11-27 DIAGNOSIS — Z23 Encounter for immunization: Secondary | ICD-10-CM | POA: Diagnosis not present

## 2011-12-04 DIAGNOSIS — I441 Atrioventricular block, second degree: Secondary | ICD-10-CM | POA: Diagnosis not present

## 2011-12-04 DIAGNOSIS — E785 Hyperlipidemia, unspecified: Secondary | ICD-10-CM | POA: Diagnosis not present

## 2011-12-04 DIAGNOSIS — Z950 Presence of cardiac pacemaker: Secondary | ICD-10-CM | POA: Diagnosis not present

## 2011-12-04 DIAGNOSIS — I10 Essential (primary) hypertension: Secondary | ICD-10-CM | POA: Diagnosis not present

## 2011-12-19 DIAGNOSIS — E785 Hyperlipidemia, unspecified: Secondary | ICD-10-CM | POA: Diagnosis not present

## 2011-12-26 DIAGNOSIS — Z961 Presence of intraocular lens: Secondary | ICD-10-CM | POA: Diagnosis not present

## 2011-12-26 DIAGNOSIS — H3530 Unspecified macular degeneration: Secondary | ICD-10-CM | POA: Diagnosis not present

## 2011-12-26 DIAGNOSIS — H0230 Blepharochalasis unspecified eye, unspecified eyelid: Secondary | ICD-10-CM | POA: Diagnosis not present

## 2011-12-26 DIAGNOSIS — H40019 Open angle with borderline findings, low risk, unspecified eye: Secondary | ICD-10-CM | POA: Diagnosis not present

## 2011-12-29 ENCOUNTER — Encounter: Payer: Self-pay | Admitting: Internal Medicine

## 2011-12-29 ENCOUNTER — Ambulatory Visit (INDEPENDENT_AMBULATORY_CARE_PROVIDER_SITE_OTHER): Payer: Medicare Other | Admitting: Internal Medicine

## 2011-12-29 VITALS — BP 150/82 | HR 88 | Temp 97.6°F | Wt 137.5 lb

## 2011-12-29 DIAGNOSIS — E785 Hyperlipidemia, unspecified: Secondary | ICD-10-CM

## 2011-12-29 DIAGNOSIS — Z8679 Personal history of other diseases of the circulatory system: Secondary | ICD-10-CM | POA: Diagnosis not present

## 2011-12-29 DIAGNOSIS — R269 Unspecified abnormalities of gait and mobility: Secondary | ICD-10-CM

## 2011-12-29 DIAGNOSIS — I10 Essential (primary) hypertension: Secondary | ICD-10-CM | POA: Diagnosis not present

## 2011-12-29 DIAGNOSIS — R0989 Other specified symptoms and signs involving the circulatory and respiratory systems: Secondary | ICD-10-CM

## 2011-12-29 DIAGNOSIS — M810 Age-related osteoporosis without current pathological fracture: Secondary | ICD-10-CM

## 2011-12-29 DIAGNOSIS — Z950 Presence of cardiac pacemaker: Secondary | ICD-10-CM

## 2012-01-02 DIAGNOSIS — H353290 Exudative age-related macular degeneration, unspecified eye, stage unspecified: Secondary | ICD-10-CM | POA: Diagnosis not present

## 2012-01-02 DIAGNOSIS — H35059 Retinal neovascularization, unspecified, unspecified eye: Secondary | ICD-10-CM | POA: Diagnosis not present

## 2012-01-20 ENCOUNTER — Other Ambulatory Visit: Payer: Self-pay | Admitting: Internal Medicine

## 2012-01-26 NOTE — Patient Instructions (Addendum)
Continue same medications and return in 6 months. Obtain rolling walker with seat

## 2012-01-26 NOTE — Progress Notes (Signed)
  Subjective:    Patient ID: [name] [name], female    DOB: 04-22-1916, 78 y.o.   MRN: [id number]  HPI pleasant 78 year old white female in today for six-month recheck and patient requesting prescription for rolling walker with a seat. She gets tired easily. She is a fall risk. Has not had a recent fall but feels unsteady sometimes. She has a history of hyperlipidemia, hypertension, osteoporosis. She also has a pacemaker placed in 2006 for Mobitz 2 dysrhythmia. History of right carotid bruit.  She resides at [facility]. Had colonoscopy in 2002 and we have decided not repeat it at her age. Gets annual influenza immunization through [facility]. Had Zostavax vaccine August 2011. Had tetanus immunization 05/02/2011.    Review of Systems     Objective:   Physical Exam has right carotid bruit, neck is supple without thyromegaly. Chest clear to auscultation. Cardiac exam regular rate and rhythm normal S1 and S2. Abdomen no hepatosplenomegaly masses or tenderness. She is alert and oriented x3. Skin is warm and dry. Affect is normal. No lower extremity edema.       Assessment & Plan:  Hypertension  Hyperlipidemia-stable  Hyperlipidemi hypertension   Hypertension  Hyperlipidemia  Osteoporosis  Gait disorder due to age requiring rolling walker with seat  History of pacemaker  Plan: Prescription given for rolling walker with seat. Continue same medications and return for physical exam in 6 months.

## 2012-02-12 DIAGNOSIS — H35059 Retinal neovascularization, unspecified, unspecified eye: Secondary | ICD-10-CM | POA: Diagnosis not present

## 2012-02-12 DIAGNOSIS — H353290 Exudative age-related macular degeneration, unspecified eye, stage unspecified: Secondary | ICD-10-CM | POA: Diagnosis not present

## 2012-03-08 DIAGNOSIS — I10 Essential (primary) hypertension: Secondary | ICD-10-CM | POA: Diagnosis not present

## 2012-03-08 DIAGNOSIS — Z950 Presence of cardiac pacemaker: Secondary | ICD-10-CM | POA: Diagnosis not present

## 2012-03-08 DIAGNOSIS — I441 Atrioventricular block, second degree: Secondary | ICD-10-CM | POA: Diagnosis not present

## 2012-03-08 DIAGNOSIS — E785 Hyperlipidemia, unspecified: Secondary | ICD-10-CM | POA: Diagnosis not present

## 2012-04-22 DIAGNOSIS — H35059 Retinal neovascularization, unspecified, unspecified eye: Secondary | ICD-10-CM | POA: Diagnosis not present

## 2012-04-22 DIAGNOSIS — H353290 Exudative age-related macular degeneration, unspecified eye, stage unspecified: Secondary | ICD-10-CM | POA: Diagnosis not present

## 2012-05-15 ENCOUNTER — Other Ambulatory Visit: Payer: Self-pay | Admitting: Internal Medicine

## 2012-05-15 ENCOUNTER — Other Ambulatory Visit: Payer: Self-pay

## 2012-06-07 DIAGNOSIS — I10 Essential (primary) hypertension: Secondary | ICD-10-CM | POA: Diagnosis not present

## 2012-06-07 DIAGNOSIS — E785 Hyperlipidemia, unspecified: Secondary | ICD-10-CM | POA: Diagnosis not present

## 2012-06-07 DIAGNOSIS — I441 Atrioventricular block, second degree: Secondary | ICD-10-CM | POA: Diagnosis not present

## 2012-06-07 DIAGNOSIS — Z950 Presence of cardiac pacemaker: Secondary | ICD-10-CM | POA: Diagnosis not present

## 2012-06-18 ENCOUNTER — Other Ambulatory Visit: Payer: Self-pay

## 2012-06-18 DIAGNOSIS — D0470 Carcinoma in situ of skin of unspecified lower limb, including hip: Secondary | ICD-10-CM | POA: Diagnosis not present

## 2012-06-18 DIAGNOSIS — D235 Other benign neoplasm of skin of trunk: Secondary | ICD-10-CM | POA: Diagnosis not present

## 2012-06-18 DIAGNOSIS — Z1231 Encounter for screening mammogram for malignant neoplasm of breast: Secondary | ICD-10-CM

## 2012-06-18 DIAGNOSIS — Z85820 Personal history of malignant melanoma of skin: Secondary | ICD-10-CM | POA: Diagnosis not present

## 2012-06-25 DIAGNOSIS — E039 Hypothyroidism, unspecified: Secondary | ICD-10-CM | POA: Diagnosis not present

## 2012-06-25 DIAGNOSIS — Z13220 Encounter for screening for lipoid disorders: Secondary | ICD-10-CM | POA: Diagnosis not present

## 2012-06-25 DIAGNOSIS — Z130 Encounter for screening for diseases of the blood and blood-forming organs and certain disorders involving the immune mechanism: Secondary | ICD-10-CM | POA: Diagnosis not present

## 2012-06-25 DIAGNOSIS — I10 Essential (primary) hypertension: Secondary | ICD-10-CM | POA: Diagnosis not present

## 2012-06-25 DIAGNOSIS — E210 Primary hyperparathyroidism: Secondary | ICD-10-CM | POA: Diagnosis not present

## 2012-07-01 ENCOUNTER — Encounter: Payer: Self-pay | Admitting: Internal Medicine

## 2012-07-01 ENCOUNTER — Ambulatory Visit (INDEPENDENT_AMBULATORY_CARE_PROVIDER_SITE_OTHER): Payer: Medicare Other | Admitting: Internal Medicine

## 2012-07-01 VITALS — BP 150/62 | HR 80 | Temp 98.2°F | Ht 63.0 in | Wt 142.0 lb

## 2012-07-01 DIAGNOSIS — H9191 Unspecified hearing loss, right ear: Secondary | ICD-10-CM

## 2012-07-01 DIAGNOSIS — H3530 Unspecified macular degeneration: Secondary | ICD-10-CM | POA: Diagnosis not present

## 2012-07-01 DIAGNOSIS — Z950 Presence of cardiac pacemaker: Secondary | ICD-10-CM

## 2012-07-01 DIAGNOSIS — I10 Essential (primary) hypertension: Secondary | ICD-10-CM | POA: Diagnosis not present

## 2012-07-01 DIAGNOSIS — E785 Hyperlipidemia, unspecified: Secondary | ICD-10-CM | POA: Diagnosis not present

## 2012-07-01 DIAGNOSIS — H9190 Unspecified hearing loss, unspecified ear: Secondary | ICD-10-CM

## 2012-07-01 DIAGNOSIS — R0989 Other specified symptoms and signs involving the circulatory and respiratory systems: Secondary | ICD-10-CM
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
## 2012-07-09 ENCOUNTER — Ambulatory Visit
Admission: RE | Admit: 2012-07-09 | Discharge: 2012-07-09 | Disposition: A | Discharge status: Home / Self Care | Payer: Medicare Other | Source: Ambulatory Visit

## 2012-07-09 ENCOUNTER — Encounter: Payer: Self-pay | Admitting: Internal Medicine

## 2012-07-09 DIAGNOSIS — E785 Hyperlipidemia, unspecified: Secondary | ICD-10-CM | POA: Diagnosis not present

## 2012-07-09 DIAGNOSIS — Z950 Presence of cardiac pacemaker: Secondary | ICD-10-CM | POA: Diagnosis not present

## 2012-07-09 DIAGNOSIS — Z1231 Encounter for screening mammogram for malignant neoplasm of breast: Secondary | ICD-10-CM

## 2012-07-09 DIAGNOSIS — I441 Atrioventricular block, second degree: Secondary | ICD-10-CM | POA: Diagnosis not present

## 2012-07-09 DIAGNOSIS — I10 Essential (primary) hypertension: Secondary | ICD-10-CM | POA: Diagnosis not present

## 2012-07-10 ENCOUNTER — Other Ambulatory Visit: Payer: Self-pay

## 2012-07-10 MED ORDER — PRAVASTATIN SODIUM 20 MG PO TABS: 20.0000 mg | ORAL_TABLET | Freq: Every day | ORAL | Status: DC

## 2012-07-12 ENCOUNTER — Other Ambulatory Visit: Payer: Self-pay

## 2012-07-12 MED ORDER — PRAVASTATIN SODIUM 20 MG PO TABS: 20.0000 mg | ORAL_TABLET | Freq: Every day | ORAL | Status: DC

## 2012-07-15 NOTE — Progress Notes (Signed)
Patient ID: [name] [name], female   DOB: 11/27/1916, 79 y.o.   MRN: [id number] Kynnedi, Der  Date of visit:  07/25/2011 DOB:  [birth year]    Age:  79 yrs. Medical record number:  [id number]     Account number:  [id number] Primary Care Provider: Em[facility] General ____________________________ CURRENT DIAGNOSES  1. Hyperlipidemia  2. Hypertension-Essential (Benign)  3. Conduction Disorder-Mobitz Type II  4. Pacemaker/cardiac insitu  5. Essential hypertension ____________________________ ALLERGIES  codeine sulfate  Fosamax, Abdominal pain ____________________________ MEDICATIONS  1. aspirin 81 mg tablet, effervescent, 1 p.o. q.d.  2. hydrochlorothiazide 12.5 mg tablet, 1 p.o. q.d.  3. lisinopril 10 mg tablet, 1 p.o. q.d.  4. Pravachol 20 mg tablet, 1 p.o. q.d.  5. Calcium 600 600 mg (1,500 mg) tablet, BID  6. Colace 100 mg Capsule, PRN  7. Tylenol PM Extra Strength 25-500 mg Tablet, QHS  8. Ocuvite 150-30-6-150 mg-unit-mg-mg capsule, BID ____________________________ HISTORY OF PRESENT ILLNESS  Patient seen for cardiac followup. She has had a good year since she was previously here. Her pacemaker showing about 15 months left on the battery. She has noted some more fatigue but for the most part is feeling well. She denies angina and has no PND, orthopnea, syncope, or claudication. She is still able to drive. She does report some macular degeneration.  ____________________________ PAST HISTORY  Past Medical Illnesses:  hypertension, hyperlipidemia, osteoporosis, lumbar disc disease;  Cardiovascular Illnesses:  syncope, Mobitz 2 heart block;  Surgical Procedures:  breast lumpectomy, tubes bil ear, cataract extraction OU;  Cardiology Procedures-Invasive:  [employer] pacemaker implant February 2006;  Cardiology Procedures-Noninvasive:  holter monitor September 2005, echocardiogram October 2005, event monitor January 2006;  LVEF of 60% documented via echocardiogram on 12/14/2003    ____________________________ CARDIO-PULMONARY TEST DATES EKG Date:  07/26/2010;  Holter/Event Monitor Date: 03/02/2004;  Echocardiography Date: 12/14/2003;  Chest Xray Date: 07/21/2008;   ____________________________ SOCIAL HISTORY Alcohol Use:  wine 1 per day;  Smoking:  used to smoke but quit Prior to 1980;  Diet:  regular diet;  Lifestyle:  widowed and 3 sons;  Exercise:  walking;  Occupation:  retired Camera operator;  Residence:  lives at [facility];   ____________________________ REVIEW OF SYSTEMS General:  malaise and fatigue  Eyes:  cataract extraction bilaterally, diagnosis of macular degeneration Ears, Nose, Throat, Mouth:  partial hearing loss  Respiratory:  mild dyspnea with exertion  Cardiovascular:  please review HPI  Abdominal:  denies dyspepsia, GI bleeding, constipation, or diarrhea  Genitourinary-Female:  nocturia, urgency  Musculoskeletal:  mild chronic low back pain, osteoporosis ____________________________ PHYSICAL EXAMINATION VITAL SIGNS  Blood Pressure:  136/80 Sitting, Left arm, regular cuff  , 138/74 Standing, Left arm and regular cuff   Pulse:  94/min. Weight:  139.00 lbs. Height:  63"BMI: 24  Constitutional:  pleasant white female, in no acute distress Skin:  warm and dry to touch, no apparent skin lesions, or masses noted. Head:  normocephalic, normal hair pattern, no masses or tenderness ENT:  ears, nose and throat reveal no gross abnormalities.  Dentition good. Neck:  supple, no masses, thyromegaly, JVD. Carotid pulses are full and equal bilaterally without bruits. Chest:  normal symmetry, clear to auscultation and percussion., healed pacemaker incision in the left pectoral area Cardiac:  regular rhythm, normal S1 and S2, No S3 or S4, no murmurs, gallops or rubs detected. Peripheral Pulses:  the femoral,dorsalis pedis, and posterior tibial pulses are full and equal bilaterally with no bruits auscultated. Extremities & Back:  no  deformities, clubbing, cyanosis,  erythema or edema observed. Normal muscle strength and tone. Neurological:  no gross motor or sensory deficits noted, affect appropriate, oriented x3. ____________________________ IMPRESSIONS/PLAN  1. Functioning permanent pacemaker for previous Mobitz heart block 2. Hypertension controlled 3. Hyperlipidemia under treatment  Recommendations:  Discussed her pacemaker and battery life and told her to be expecting a battery change sometime in the next 18 months. Followup otherwise in one year and continue remote pacemaker monitoring. ___________________________ TODAYS ORDERS  1. Return Visit: 1 year  2. 12 Lead EKG: 1 year                       ____________________________ Cardiology Physician:  Kerry Hough MD [facility]

## 2012-07-19 ENCOUNTER — Encounter: Payer: Self-pay | Admitting: *Deleted

## 2012-07-23 ENCOUNTER — Encounter: Payer: Self-pay | Admitting: *Deleted

## 2012-07-23 ENCOUNTER — Encounter: Payer: Self-pay | Admitting: Internal Medicine

## 2012-07-23 ENCOUNTER — Ambulatory Visit (INDEPENDENT_AMBULATORY_CARE_PROVIDER_SITE_OTHER): Payer: Medicare Other | Admitting: Internal Medicine

## 2012-07-23 VITALS — BP 118/62 | HR 65 | Ht 63.0 in | Wt 142.0 lb

## 2012-07-23 DIAGNOSIS — Z01810 Encounter for preprocedural cardiovascular examination: Secondary | ICD-10-CM | POA: Diagnosis not present

## 2012-07-23 DIAGNOSIS — I442 Atrioventricular block, complete: Secondary | ICD-10-CM

## 2012-07-23 DIAGNOSIS — Z950 Presence of cardiac pacemaker: Secondary | ICD-10-CM | POA: Diagnosis not present

## 2012-07-23 DIAGNOSIS — I499 Cardiac arrhythmia, unspecified: Secondary | ICD-10-CM | POA: Diagnosis not present

## 2012-07-23 NOTE — Assessment & Plan Note (Signed)
Her device has reached ERI. Surprisingly with reversion to VVI she has not had significant symptoms.  We have reviewed the benefits and risks of generator replacement.  These include but are not limited to lead fracture and infection.  The patient understands, agrees and is willing to proceed.

## 2012-07-23 NOTE — Progress Notes (Signed)
ELECTROPHYSIOLOGY CONSULT NOTE  Patient ID: [name] [name], MRN: [id number], DOB/AGE: September 12, 1916 79 y.o. Admit date: (Not on file) Date of Consult: 07/23/2012  Primary Physician: Elby Showers, MD Primary Cardiologist: wst  Chief Complaint:  Need pacemaker replaced   HPI [name] [name] is a 79 y.o. female   Status post pacemaker implantation 2006 with her device now at [facility].  She has noted no significant change in her exercise tolerance. SHe was originally implanted for Mobitz 2 heart block. She now has complete heart block.  Cardiac evaluation has included an echocardiogram 2005 demonstrating normal left ventricular function. She denies chest pain edema or syncope.   Past Medical History  Diagnosis Date  . HTN (hypertension), benign   . Osteoporosis   . Hyperlipidemia   . Mobitz II   . H/O syncope   . Melanoma of back 2009  . Cardiac pacemaker in situ 04/01/2004    Original implant 04/01/04 Dr. Doreatha Lew Indications syncope with Mobitz 2 heart block  RV lead Guidant 4470 52 cm bipolar lead, SN [id number] atrial lead Guidant bipolar serial [id number].  Medtronic EnPulse Model number [id number], SN PNB [id number]   . Carotid bruit 10/25/2010    right  . Diverticulosis   . 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 Hiatal hernia       Surgical History:  Past Surgical History  Procedure Laterality Date  . Cataract extraction, bilateral  2001  . Breast biopsy  1960    Left  . Lom hemotympanum throat & sinus bx  1977  . Pacemaker insertion  2006    Medtronic DDDR E2DRO1, SERIAL #  [id number]  . Breast lumpectomy  1960  . Surgery of ear       Home Meds: Prior to Admission medications   Medication Sig Start Date End Date Taking? Authorizing Provider  Acetaminophen (TYLENOL PO) Take by mouth as needed.     Yes Historical Provider, MD  aspirin 81 MG tablet Take 81 mg by mouth daily.     Yes Historical Provider, MD  Calcium Carbonate-Vitamin D (CALCIUM + D PO) Take by mouth daily.     Yes  Historical Provider, MD  hydrochlorothiazide (MICROZIDE) 12.5 MG capsule TAKE 1 CAPSULE BY MOUTH EVERY DAY 01/20/12  Yes Elby Showers, MD  lisinopril (PRINIVIL,ZESTRIL) 10 MG tablet TAKE 1 TABLET BY MOUTH EVERY DAY 05/15/12  Yes Elby Showers, MD  Multiple Vitamins-Minerals (EYE VITAMINS) CAPS Take by mouth.   Yes Historical Provider, MD  pravastatin (PRAVACHOL) 20 MG tablet Take 1 tablet (20 mg total) by mouth daily. 07/12/12  Yes Elby Showers, MD     Allergies:  Allergies  Allergen Reactions  . Codeine Nausea Only  . Triamterene-Hctz Other (See Comments)    fatigue  . Vasotec Diarrhea    History   Social History  . Marital Status: Widowed    Spouse Name: N/A    Number of Children: 3  . Years of Education: N/A   Occupational History  . Not on file.   Social History Main Topics  . Smoking status: Former Smoker -- 0.33 packs/day for 10 years    Types: Cigarettes    Quit date: 02/27/1946  . Smokeless tobacco: Never Used  . Alcohol Use: No  . Drug Use: No  . Sexually Active: Not on file   Other Topics Concern  . Not on file   Social History Narrative  . No narrative on file     Family History  Problem Relation Age of Onset  . Kidney disease Father 15  . Stroke Mother 41     ROS:  Please see the history of present illness.     All other systems reviewed and negative.    Physical Exam:   Blood pressure 118/62, pulse 65, height 5\' 3"  (1.6 m), weight 142 lb (64.411 kg). General: Well developed, well nourished appearing much younger than her stated age female in no acute distress. Head: Normocephalic, atraumatic, sclera non-icteric, no xanthomas, nares are without discharge. EENT: normal Lymph Nodes:  none Back: without scoliosis/kyphosis, no CVA tendersness Neck: Negative for carotid bruits. JVD not elevated. Lungs: Clear bilaterally to auscultation without wheezes, rales, or rhonchi. Breathing is unlabored. Heart: RRR with S1 S2. No  murmur , rubs,  +s4 Abdomen: Soft, non-tender, non-distended with normoactive bowel sounds. No hepatomegaly. No rebound/guarding. No obvious abdominal masses. Msk:  Strength and tone appear normal for age. Extremities: No clubbing or cyanosis. No edema.  Distal pedal pulses are 2+ and equal bilaterally. Skin: Warm and Dry Neuro: Alert and oriented X 3. CN III-XII intact Grossly normal sensory and motor function . Psych:  Responds to questions appropriately with a normal affect.      Labs: Cardiac Enzymes No results found for this basename: CKTOTAL, CKMB, TROPONINI,  in the last 72 hours CBC Lab Results  Component Value Date   WBC 7.6 01/15/2006   HGB 14.7 01/15/2006   HCT 44.4 01/15/2006   MCV 89.5 01/15/2006   PLT 207 01/15/2006   PROTIME: No results found for this basename: LABPROT, INR,  in the last 72 hours Chemistry No results found for this basename: NA, K, CL, CO2, BUN, CREATININE, CALCIUM, LABALBU, PROT, BILITOT, ALKPHOS, ALT, AST, GLUCOSE,  in the last 168 hours Lipids No results found for this basename: CHOL, HDL, LDLCALC, TRIG   BNP No results found for this basename: probnp   Miscellaneous No results found for this basename: DDIMER    Radiology/Studies:  Mm Digital Screening  07/10/2012   *RADIOLOGY REPORT*  Clinical Data: Screening.  DIGITAL BILATERAL SCREENING MAMMOGRAM WITH CAD DIGITAL BREAST TOMOSYNTHESIS  Digital breast tomosynthesis images are acquired in two projections.  These images are reviewed in combination with the digital mammogram, confirming the findings below.  Comparison:  02/16/2005  FINDINGS:  ACR Breast Density Category 2: There are scattered fibroglandular densities.  There is no suspicious dominant mass, architectural distortion, or calcification to suggest malignancy.  Images were processed with CAD.  IMPRESSION: No mammographic evidence of malignancy.  A result letter of this screening mammogram will be mailed directly to the patient.  RECOMMENDATION:  Screening mammogram in one year if this is felt to be clinically helpful. (Code:SM-B-01Y)  BI-RADS CATEGORY 1:  Negative.   Original Report Authenticated By: Ulyess Blossom, M.D.    EKG:  Ventricular pacing with complete heart block  Assessment and Plan:     Virl Axe

## 2012-07-23 NOTE — Patient Instructions (Addendum)
Your physician has recommended that you have a pacemaker generator change. Please see the instruction sheet given to you today for more information.

## 2012-07-23 NOTE — Assessment & Plan Note (Signed)
Stable post pacing 

## 2012-07-25 DIAGNOSIS — H353290 Exudative age-related macular degeneration, unspecified eye, stage unspecified: Secondary | ICD-10-CM | POA: Diagnosis not present

## 2012-07-25 DIAGNOSIS — H35359 Cystoid macular degeneration, unspecified eye: Secondary | ICD-10-CM | POA: Diagnosis not present

## 2012-07-25 DIAGNOSIS — H353190 Nonexudative age-related macular degeneration, unspecified eye, stage unspecified: Secondary | ICD-10-CM | POA: Diagnosis not present

## 2012-07-28 NOTE — Patient Instructions (Addendum)
Continue same medications and return in 6 months 

## 2012-08-02 ENCOUNTER — Encounter (HOSPITAL_COMMUNITY): Payer: Self-pay | Admitting: Pharmacy Technician

## 2012-08-08 ENCOUNTER — Encounter: Payer: Self-pay | Admitting: Internal Medicine

## 2012-08-08 DIAGNOSIS — I499 Cardiac arrhythmia, unspecified: Secondary | ICD-10-CM | POA: Diagnosis not present

## 2012-08-08 DIAGNOSIS — I442 Atrioventricular block, complete: Secondary | ICD-10-CM | POA: Diagnosis not present

## 2012-08-11 ENCOUNTER — Other Ambulatory Visit: Payer: Self-pay | Admitting: Internal Medicine

## 2012-08-15 ENCOUNTER — Telehealth: Payer: Self-pay | Admitting: *Deleted

## 2012-08-15 DIAGNOSIS — H9190 Unspecified hearing loss, unspecified ear: Secondary | ICD-10-CM | POA: Diagnosis not present

## 2012-08-15 DIAGNOSIS — I442 Atrioventricular block, complete: Secondary | ICD-10-CM | POA: Diagnosis not present

## 2012-08-15 DIAGNOSIS — Z45018 Encounter for adjustment and management of other part of cardiac pacemaker: Secondary | ICD-10-CM | POA: Diagnosis not present

## 2012-08-15 DIAGNOSIS — K5730 Diverticulosis of large intestine without perforation or abscess without bleeding: Secondary | ICD-10-CM | POA: Diagnosis not present

## 2012-08-15 DIAGNOSIS — E785 Hyperlipidemia, unspecified: Secondary | ICD-10-CM | POA: Diagnosis not present

## 2012-08-15 DIAGNOSIS — Z79899 Other long term (current) drug therapy: Secondary | ICD-10-CM | POA: Diagnosis not present

## 2012-08-15 DIAGNOSIS — Z85820 Personal history of malignant melanoma of skin: Secondary | ICD-10-CM | POA: Diagnosis not present

## 2012-08-15 DIAGNOSIS — Z7982 Long term (current) use of aspirin: Secondary | ICD-10-CM | POA: Diagnosis not present

## 2012-08-15 DIAGNOSIS — Z823 Family history of stroke: Secondary | ICD-10-CM | POA: Diagnosis not present

## 2012-08-15 DIAGNOSIS — M810 Age-related osteoporosis without current pathological fracture: Secondary | ICD-10-CM | POA: Diagnosis not present

## 2012-08-15 DIAGNOSIS — Z888 Allergy status to other drugs, medicaments and biological substances status: Secondary | ICD-10-CM | POA: Diagnosis not present

## 2012-08-15 DIAGNOSIS — Z87891 Personal history of nicotine dependence: Secondary | ICD-10-CM | POA: Diagnosis not present

## 2012-08-15 DIAGNOSIS — I10 Essential (primary) hypertension: Secondary | ICD-10-CM | POA: Diagnosis not present

## 2012-08-15 DIAGNOSIS — K449 Diaphragmatic hernia without obstruction or gangrene: Secondary | ICD-10-CM | POA: Diagnosis not present

## 2012-08-15 DIAGNOSIS — R0989 Other specified symptoms and signs involving the circulatory and respiratory systems: Secondary | ICD-10-CM | POA: Diagnosis not present

## 2012-08-15 DIAGNOSIS — Z885 Allergy status to narcotic agent status: Secondary | ICD-10-CM | POA: Diagnosis not present

## 2012-08-15 MED ORDER — SODIUM CHLORIDE 0.9 % IR SOLN
80.0000 mg | Status: DC
  Filled 2012-08-15: qty 2

## 2012-08-15 MED ORDER — CEFAZOLIN SODIUM-DEXTROSE 2-3 GM-% IV SOLR
2.0000 g | INTRAVENOUS | Status: DC
  Filled 2012-08-15 (×2): qty 50

## 2012-08-15 NOTE — Telephone Encounter (Signed)
I left a message for the patient to call. She is scheduled for a generator change tomorrow at 10:30 am. We need to r/s her to arrive at 12 pm for a 2:00 pm procedure. I have spoken with switchboard at [facility] [place]. They have stated if I left a message the patient should get this and call back.

## 2012-08-15 NOTE — Telephone Encounter (Signed)
F/u   Pt returned your call

## 2012-08-16 ENCOUNTER — Ambulatory Visit (HOSPITAL_COMMUNITY)
Admission: RE | Admit: 2012-08-16 | Discharge: 2012-08-16 | Disposition: A | Discharge status: Home / Self Care | Payer: Medicare Other | Source: Ambulatory Visit | Attending: Internal Medicine | Admitting: Internal Medicine

## 2012-08-16 ENCOUNTER — Encounter (HOSPITAL_COMMUNITY)
Admission: RE | Disposition: A | Discharge status: Home / Self Care | Payer: Self-pay | Source: Ambulatory Visit | Attending: Internal Medicine

## 2012-08-16 DIAGNOSIS — K5730 Diverticulosis of large intestine without perforation or abscess without bleeding: Secondary | ICD-10-CM | POA: Insufficient documentation

## 2012-08-16 DIAGNOSIS — I442 Atrioventricular block, complete: Secondary | ICD-10-CM | POA: Insufficient documentation

## 2012-08-16 DIAGNOSIS — Z87891 Personal history of nicotine dependence: Secondary | ICD-10-CM | POA: Insufficient documentation

## 2012-08-16 DIAGNOSIS — I499 Cardiac arrhythmia, unspecified: Secondary | ICD-10-CM

## 2012-08-16 DIAGNOSIS — H9190 Unspecified hearing loss, unspecified ear: Secondary | ICD-10-CM | POA: Insufficient documentation

## 2012-08-16 DIAGNOSIS — Z01810 Encounter for preprocedural cardiovascular examination: Secondary | ICD-10-CM

## 2012-08-16 DIAGNOSIS — Z45018 Encounter for adjustment and management of other part of cardiac pacemaker: Secondary | ICD-10-CM | POA: Insufficient documentation

## 2012-08-16 DIAGNOSIS — Z823 Family history of stroke: Secondary | ICD-10-CM | POA: Insufficient documentation

## 2012-08-16 DIAGNOSIS — Z888 Allergy status to other drugs, medicaments and biological substances status: Secondary | ICD-10-CM | POA: Insufficient documentation

## 2012-08-16 DIAGNOSIS — Z7982 Long term (current) use of aspirin: Secondary | ICD-10-CM | POA: Insufficient documentation

## 2012-08-16 DIAGNOSIS — E785 Hyperlipidemia, unspecified: Secondary | ICD-10-CM | POA: Insufficient documentation

## 2012-08-16 DIAGNOSIS — M810 Age-related osteoporosis without current pathological fracture: Secondary | ICD-10-CM | POA: Insufficient documentation

## 2012-08-16 DIAGNOSIS — Z950 Presence of cardiac pacemaker: Secondary | ICD-10-CM

## 2012-08-16 DIAGNOSIS — Z79899 Other long term (current) drug therapy: Secondary | ICD-10-CM | POA: Insufficient documentation

## 2012-08-16 DIAGNOSIS — Z85820 Personal history of malignant melanoma of skin: Secondary | ICD-10-CM | POA: Insufficient documentation

## 2012-08-16 DIAGNOSIS — R0989 Other specified symptoms and signs involving the circulatory and respiratory systems: Secondary | ICD-10-CM | POA: Insufficient documentation

## 2012-08-16 DIAGNOSIS — Z885 Allergy status to narcotic agent status: Secondary | ICD-10-CM | POA: Insufficient documentation

## 2012-08-16 DIAGNOSIS — K449 Diaphragmatic hernia without obstruction or gangrene: Secondary | ICD-10-CM | POA: Insufficient documentation

## 2012-08-16 DIAGNOSIS — I10 Essential (primary) hypertension: Secondary | ICD-10-CM | POA: Insufficient documentation

## 2012-08-16 HISTORY — PX: PACEMAKER GENERATOR CHANGE: SHX5481

## 2012-08-16 LAB — SURGICAL PCR SCREEN
MRSA, PCR: NEGATIVE
Staphylococcus aureus: NEGATIVE

## 2012-08-16 LAB — BASIC METABOLIC PANEL
BUN: 41 mg/dL — ABNORMAL HIGH (ref 6–23)
Calcium: 9.6 mg/dL (ref 8.4–10.5)
GFR calc Af Amer: 38 mL/min — ABNORMAL LOW (ref >90)
GFR calc non Af Amer: 32 mL/min — ABNORMAL LOW (ref >90)
Glucose, Bld: 94 mg/dL (ref 70–99)
Potassium: 3.8 mEq/L (ref 3.5–5.1)

## 2012-08-16 LAB — CBC
HCT: 42.3 % (ref 36.0–46.0)
Hemoglobin: 14.5 g/dL (ref 12.0–15.0)
MCH: 29.5 pg (ref 26.0–34.0)
MCHC: 34.3 g/dL (ref 30.0–36.0)
RDW: 13.7 % (ref 11.5–15.5)

## 2012-08-16 SURGERY — PACEMAKER GENERATOR CHANGE
Anesthesia: LOCAL

## 2012-08-16 MED ORDER — MUPIROCIN 2 % EX OINT
TOPICAL_OINTMENT | Freq: Two times a day (BID) | CUTANEOUS | Status: DC
  Filled 2012-08-16: qty 22

## 2012-08-16 MED ORDER — ONDANSETRON HCL 4 MG/2ML IJ SOLN: 4.0000 mg | Freq: Four times a day (QID) | INTRAMUSCULAR | Status: DC | PRN

## 2012-08-16 MED ORDER — ACETAMINOPHEN 325 MG PO TABS: 325.0000 mg | ORAL_TABLET | ORAL | Status: DC | PRN

## 2012-08-16 MED ORDER — MIDAZOLAM HCL 5 MG/5ML IJ SOLN
INTRAMUSCULAR | Status: AC
  Filled 2012-08-16: qty 5

## 2012-08-16 MED ORDER — FENTANYL CITRATE 0.05 MG/ML IJ SOLN
INTRAMUSCULAR | Status: AC
  Filled 2012-08-16: qty 2

## 2012-08-16 MED ORDER — LIDOCAINE HCL (PF) 1 % IJ SOLN
INTRAMUSCULAR | Status: AC
  Filled 2012-08-16: qty 60

## 2012-08-16 MED ORDER — SODIUM CHLORIDE 0.9 % IV SOLN
INTRAVENOUS | Status: DC
  Administered 2012-08-16: 09:00:00 via INTRAVENOUS

## 2012-08-16 MED ORDER — SODIUM CHLORIDE 0.9 % IV SOLN: INTRAVENOUS | Status: DC

## 2012-08-16 MED ORDER — MUPIROCIN 2 % EX OINT
TOPICAL_OINTMENT | CUTANEOUS | Status: AC
  Administered 2012-08-16: 1 via NASAL
  Filled 2012-08-16: qty 22

## 2012-08-16 MED ORDER — CHLORHEXIDINE GLUCONATE 4 % EX LIQD
60.0000 mL | Freq: Once | CUTANEOUS | Status: DC
  Filled 2012-08-16: qty 60

## 2012-08-16 NOTE — H&P (View-Only) (Signed)
ELECTROPHYSIOLOGY CONSULT NOTE  Patient ID: EARNEST ARMFIELD, MRN: [id number], DOB/AGE: 1916/11/07 79 y.o. Admit date: (Not on file) Date of Consult: 07/23/2012  Primary Physician: Elby Showers, MD Primary Cardiologist: wst  Chief Complaint:  Need pacemaker replaced   HPI [name] [name] is a 79 y.o. female   Status post pacemaker implantation 2006 with her device now at [facility]  She has noted no significant change in her exercise tolerance. SHe was originally implanted for Mobitz 2 heart block. She now has complete heart block.  Cardiac evaluation has included an echocardiogram 2005 demonstrating normal left ventricular function. She denies chest pain edema or syncope.   Past Medical History  Diagnosis Date  . HTN (hypertension), benign   . Osteoporosis   . Hyperlipidemia   . Mobitz II   . H/O syncope   . Melanoma of back 2009  . Cardiac pacemaker in situ 04/01/2004    Original implant 04/01/04 Dr. Doreatha Lew Indications syncope with Mobitz 2 heart block  RV lead Guidant 4470 52 cm bipolar lead, SN [id number] atrial lead Guidant bipolar serial [id number].  Medtronic EnPulse Model number [id number], SN PNB [id number]   . Carotid bruit 10/25/2010    right  . Diverticulosis   . 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 Hiatal hernia       Surgical History:  Past Surgical History  Procedure Laterality Date  . Cataract extraction, bilateral  2001  . Breast biopsy  1960    Left  . Lom hemotympanum throat & sinus bx  1977  . Pacemaker insertion  2006    Medtronic DDDR E2DRO1, SERIAL #  [id number]  . Breast lumpectomy  1960  . Surgery of ear       Home Meds: Prior to Admission medications   Medication Sig Start Date End Date Taking? Authorizing Provider  Acetaminophen (TYLENOL PO) Take by mouth as needed.     Yes Historical Provider, MD  aspirin 81 MG tablet Take 81 mg by mouth daily.     Yes Historical Provider, MD  Calcium Carbonate-Vitamin D (CALCIUM + D PO) Take by mouth daily.     Yes  Historical Provider, MD  hydrochlorothiazide (MICROZIDE) 12.5 MG capsule TAKE 1 CAPSULE BY MOUTH EVERY DAY 01/20/12  Yes Elby Showers, MD  lisinopril (PRINIVIL,ZESTRIL) 10 MG tablet TAKE 1 TABLET BY MOUTH EVERY DAY 05/15/12  Yes Elby Showers, MD  Multiple Vitamins-Minerals (EYE VITAMINS) CAPS Take by mouth.   Yes Historical Provider, MD  pravastatin (PRAVACHOL) 20 MG tablet Take 1 tablet (20 mg total) by mouth daily. 07/12/12  Yes Elby Showers, MD     Allergies:  Allergies  Allergen Reactions  . Codeine Nausea Only  . Triamterene-Hctz Other (See Comments)    fatigue  . Vasotec Diarrhea    History   Social History  . Marital Status: Widowed    Spouse Name: N/A    Number of Children: 3  . Years of Education: N/A   Occupational History  . Not on file.   Social History Main Topics  . Smoking status: Former Smoker -- 0.33 packs/day for 10 years    Types: Cigarettes    Quit date: 02/27/1946  . Smokeless tobacco: Never Used  . Alcohol Use: No  . Drug Use: No  . Sexually Active: Not on file   Other Topics Concern  . Not on file   Social History Narrative  . No narrative on file     Family History  Problem Relation Age of Onset  . Kidney disease Father 19  . Stroke Mother 50     ROS:  Please see the history of present illness.     All other systems reviewed and negative.    Physical Exam:   Blood pressure 118/62, pulse 65, height 5\' 3"  (1.6 m), weight 142 lb (64.411 kg). General: Well developed, well nourished appearing much younger than her stated age female in no acute distress. Head: Normocephalic, atraumatic, sclera non-icteric, no xanthomas, nares are without discharge. EENT: normal Lymph Nodes:  none Back: without scoliosis/kyphosis, no CVA tendersness Neck: Negative for carotid bruits. JVD not elevated. Lungs: Clear bilaterally to auscultation without wheezes, rales, or rhonchi. Breathing is unlabored. Heart: RRR with S1 S2. No  murmur , rubs,  +s4 Abdomen: Soft, non-tender, non-distended with normoactive bowel sounds. No hepatomegaly. No rebound/guarding. No obvious abdominal masses. Msk:  Strength and tone appear normal for age. Extremities: No clubbing or cyanosis. No edema.  Distal pedal pulses are 2+ and equal bilaterally. Skin: Warm and Dry Neuro: Alert and oriented X 3. CN III-XII intact Grossly normal sensory and motor function . Psych:  Responds to questions appropriately with a normal affect.      Labs: Cardiac Enzymes No results found for this basename: CKTOTAL, CKMB, TROPONINI,  in the last 72 hours CBC Lab Results  Component Value Date   WBC 7.6 01/15/2006   HGB 14.7 01/15/2006   HCT 44.4 01/15/2006   MCV 89.5 01/15/2006   PLT 207 01/15/2006   PROTIME: No results found for this basename: LABPROT, INR,  in the last 72 hours Chemistry No results found for this basename: NA, K, CL, CO2, BUN, CREATININE, CALCIUM, LABALBU, PROT, BILITOT, ALKPHOS, ALT, AST, GLUCOSE,  in the last 168 hours Lipids No results found for this basename: CHOL, HDL, LDLCALC, TRIG   BNP No results found for this basename: probnp   Miscellaneous No results found for this basename: DDIMER    Radiology/Studies:  Mm Digital Screening  07/10/2012   *RADIOLOGY REPORT*  Clinical Data: Screening.  DIGITAL BILATERAL SCREENING MAMMOGRAM WITH CAD DIGITAL BREAST TOMOSYNTHESIS  Digital breast tomosynthesis images are acquired in two projections.  These images are reviewed in combination with the digital mammogram, confirming the findings below.  Comparison:  02/16/2005  FINDINGS:  ACR Breast Density Category 2: There are scattered fibroglandular densities.  There is no suspicious dominant mass, architectural distortion, or calcification to suggest malignancy.  Images were processed with CAD.  IMPRESSION: No mammographic evidence of malignancy.  A result letter of this screening mammogram will be mailed directly to the patient.  RECOMMENDATION:  Screening mammogram in one year if this is felt to be clinically helpful. (Code:SM-B-01Y)  BI-RADS CATEGORY 1:  Negative.   Original Report Authenticated By: Ulyess Blossom, M.D.    EKG:  Ventricular pacing with complete heart block  Assessment and Plan:     [name] [name]

## 2012-08-16 NOTE — Interval H&P Note (Signed)
History and Physical Interval Note:  08/16/2012 10:00 AM  [name] [name]  has presented today for surgery, with the diagnosis of End of life  The various methods of treatment have been discussed with the patient and family. After consideration of risks, benefits and other options for treatment, the patient has consented to  Procedure(s): PACEMAKER GENERATOR CHANGE (N/A) as a surgical intervention .  The patient's history has been reviewed, patient examined, no change in status, stable for surgery.  I have reviewed the patient's chart and labs.  Questions were answered to the patient's satisfaction.     Virl Axe

## 2012-08-16 NOTE — CV Procedure (Signed)
Preoperative diagnosis CHB prev  Postoperative diagnosis same/   Procedure: Generator replacement     Following informed consent the patient was brought to the electrophysiology laboratory in place of the fluoroscopic table in the supine position after routine prep and drape lidocaine was infiltrated in the region of the previous incision and carried down to later the device pocket using sharp dissection and electrocautery. The pocket was opened the device was freed up and was explanted.  Interrogation of the previously implanted ventricular lead [employer]    demonstrated an R wave of N/A  millivolts., and impedance of 480 ohms, and a pacing threshold of <1.0 V volts at 0.5 msec.    The previously implanted atrial lead [employer]  demonstrated a P-wave amplitude of 2 milllivolts  and impedance of  433 ohms, and a pacing threshold of 0.8 volts at @ 0.18milliseconds.  The leads were inspected. The leads were then attached to a Medtronic sensia pulse generator, serial number [id number] .    The pocket was irrigated with antibiotic containing saline solution hemostasis was assured and the leads and the device were placed in the pocket. The wound was then closed in 2 layers in normal fashion.  The patient tolerated the procedure without apparent complication.  Virl Axe

## 2012-09-05 ENCOUNTER — Encounter: Payer: Self-pay | Admitting: Internal Medicine

## 2012-09-05 ENCOUNTER — Ambulatory Visit (INDEPENDENT_AMBULATORY_CARE_PROVIDER_SITE_OTHER): Payer: Medicare Other | Admitting: *Deleted

## 2012-09-05 DIAGNOSIS — Z950 Presence of cardiac pacemaker: Secondary | ICD-10-CM | POA: Diagnosis not present

## 2012-09-05 DIAGNOSIS — I442 Atrioventricular block, complete: Secondary | ICD-10-CM

## 2012-09-05 LAB — PACEMAKER DEVICE OBSERVATION
AL IMPEDENCE PM: 463 Ohm
ATRIAL PACING PM: 9
BATTERY VOLTAGE: 2.79 V

## 2012-09-05 NOTE — Progress Notes (Signed)
Pt seen in device clinic for follow up of recently implanted pacemaker.  Wound well healed.  No redness, swelling, or edema.  Steri-strips removed today.   Device interrogated and found to be functioning normally.  See PaceArt for full details.  Pt denies chest pain, shortness of breath, palpitations, or dizziness.  [facility] 12/09/12 & Pt to follow up with Dr. Caryl Comes in 12 months.   Kestrel Mis 09/05/2012 10:56 AM

## 2012-09-06 ENCOUNTER — Encounter: Payer: Self-pay | Admitting: Cardiology

## 2012-09-06 DIAGNOSIS — I10 Essential (primary) hypertension: Secondary | ICD-10-CM | POA: Diagnosis not present

## 2012-09-06 DIAGNOSIS — E785 Hyperlipidemia, unspecified: Secondary | ICD-10-CM | POA: Diagnosis not present

## 2012-09-06 DIAGNOSIS — Z950 Presence of cardiac pacemaker: Secondary | ICD-10-CM

## 2012-09-06 DIAGNOSIS — I441 Atrioventricular block, second degree: Secondary | ICD-10-CM | POA: Diagnosis not present

## 2012-09-06 NOTE — Progress Notes (Signed)
Patient ID: [name] [name], female   DOB: Feb 22, 1917, 79 y.o.   MRN: [id number]  [name], [name]  Date of visit:  09/06/2012 DOB:  Dec 11, 1916    Age:  79 yrs. Medical record number:  [id number]     Account number:  [id number] Primary Care Provider: Emeline General ____________________________ CURRENT DIAGNOSES  1. Hyperlipidemia  2. Hypertension-Essential (Benign)  3. Conduction Disorder-Mobitz Type II  4. Pacemaker/cardiac insitu ____________________________ ALLERGIES  Codeine, Intolerance-unknown  FOSAMAX, Nausea ____________________________ MEDICATIONS  1. hydrochlorothiazide 12.5 mg tablet, 1 p.o. q.d.  2. lisinopril 10 mg tablet, 1 p.o. q.d.  3. Pravachol 20 mg tablet, 1 p.o. q.d.  4. Calcium 600 600 mg (1,500 mg) tablet, BID  5. Colace 100 mg Capsule, PRN  6. Tylenol PM Extra Strength 25-500 mg Tablet, QHS  7. Ocuvite 150-30-6-150 mg-unit-mg-mg capsule, BID  8. aspirin 81 mg tablet,chewable, 1 p.o. daily ____________________________ CHIEF COMPLAINTS  Followup of Hypertension-Essential (Benign)  Followup of Pacemaker/cardiac insitu ____________________________ HISTORY OF PRESENT ILLNESS   Patient seen for cardiac followup. Her pacemaker reached elective replacement and this was replaced about 3 weeks ago. She walks with a walker when walking long distances and occasionally uses a cane. She does not have angina and does try to get his regular exercise. She has no PND, orthopnea, edema, syncope, or claudication. 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 She is tolerating her medicine well without side effects. ____________________________ PAST HISTORY  Past Medical Illnesses:  hypertension, hyperlipidemia, osteoporosis, lumbar disc disease;  Cardiovascular Illnesses:  syncope, Mobitz 2 heart block;  Surgical Procedures:  breast lumpectomy, tubes bil ear, cataract extraction OU;  Cardiology Procedures-Invasive:  Medtronic pacemaker implant February 2006;  Cardiology  Procedures-Noninvasive:  holter monitor September 2005, echocardiogram October 2005, event monitor January 2006;  LVEF of 60% documented via echocardiogram on 12/14/2003,   ____________________________ CARDIO-PULMONARY TEST DATES EKG Date:  09/06/2012;  Holter/Event Monitor Date: 03/02/2004;  Echocardiography Date: 12/14/2003;  Chest Xray Date: 07/21/2008;   ____________________________ FAMILY HISTORY Brother -- Cancer Father -- kidney disease, Deceased Mother -- CVA, Deceased ____________________________ SOCIAL HISTORY Alcohol Use:  wine 1 per day;  Smoking:  used to smoke but quit Prior to 1980;  Diet:  regular diet;  Lifestyle:  widowed and 3 sons;  Exercise:  walking;  Occupation:  retired Camera operator;  Residence:  lives at [facility];   ____________________________ REVIEW OF SYSTEMS General:  malaise and fatigue Eyes: cataract extraction bilaterally, denies diplopia, history of glaucoma or visual field defects. Ears, Nose, Throat, Mouth:  partial hearing loss Respiratory: mild dyspnea with exertion Cardiovascular:  please review HPI Abdominal: denies dyspepsia, GI bleeding, constipation, or diarrheaGenitourinary-Female: nocturia, urgency Musculoskeletal:  mild chronic low back pain, osteoporosis  ____________________________ PHYSICAL EXAMINATION VITAL SIGNS  Blood Pressure:  132/70 Sitting, Left arm, regular cuff   Pulse:  80/min. Weight:  135.00 lbs. Height:  63"BMI: 24  Constitutional:  pleasant white female, in no acute distress Skin:  warm and dry to touch, no apparent skin lesions, or masses noted. Head:  normocephalic, normal hair pattern, no masses or tenderness ENT:  ears, nose and throat reveal no gross abnormalities.  Dentition good. Neck:  supple, no masses, thyromegaly, JVD. Carotid pulses are full and equal bilaterally without bruits. Chest:  normal symmetry, clear to auscultation and percussion., healed pacemaker incision in the left pectoral area Cardiac:  regular  rhythm, normal S1 and S2, No S3 or S4, no murmurs, gallops or rubs detected. Peripheral Pulses:  the  femoral,dorsalis pedis, and posterior tibial pulses are full and equal bilaterally with no bruits auscultated. Extremities & Back:  mild bilateral venous insufficiency changes present Neurological:  no gross motor or sensory deficits noted, affect appropriate, oriented x3. ____________________________ MOST RECENT LIPID PANEL 04/19/10  CHOL TOTL 195 mg/dl, LDL 111 NM, HDL 55 mg/dl, TRIGLYCER 145 mg/dl, ALT 16 u/l, ALK PHOS 62 u/l, CHOL/HDL 3.5 (Calc) and AST 17 u/l ____________________________ IMPRESSIONS/PLAN   1. Functioning permanent pacemaker for second-degree heart block 2. Hypertension controlled 3. Hyperlipidemia under treatment  Recommendations:  Her pacemaker continues to function well. We will continue remote pacemaker monitoring. Thresholds evaluated today and were under good control. Call if recurrent problems and otherwise see again in one year. EKG shows an atrial sensed and ventricular paced rhythm. ____________________________ TODAYS ORDERS  1. 12 Lead EKG: Today  2. Return Visit: 1 year                       ____________________________ Cardiology Physician:  Kerry Hough MD [facility]

## 2012-11-07 ENCOUNTER — Telehealth: Payer: Self-pay | Admitting: Internal Medicine

## 2012-11-07 NOTE — Telephone Encounter (Signed)
New Problem   Pt mailed the Old monitor back in the middle of july and has not recieved the new one//  Request a call back to discuss.

## 2012-11-11 ENCOUNTER — Telehealth: Payer: Self-pay | Admitting: *Deleted

## 2012-11-11 NOTE — Telephone Encounter (Signed)
Spoke w/pt and pt's device is followed by Dr Wynonia Lawman.

## 2012-11-11 NOTE — Telephone Encounter (Signed)
Pt knows she is followed by Dr. Wynonia Lawman for remote checks---she understands their clinic has to order her a new home transmitter (not [place]).

## 2012-12-11 DIAGNOSIS — Z23 Encounter for immunization: Secondary | ICD-10-CM | POA: Diagnosis not present

## 2012-12-25 DIAGNOSIS — H0230 Blepharochalasis unspecified eye, unspecified eyelid: Secondary | ICD-10-CM | POA: Diagnosis not present

## 2012-12-25 DIAGNOSIS — Z961 Presence of intraocular lens: Secondary | ICD-10-CM | POA: Diagnosis not present

## 2012-12-25 DIAGNOSIS — H40019 Open angle with borderline findings, low risk, unspecified eye: Secondary | ICD-10-CM | POA: Diagnosis not present

## 2012-12-25 DIAGNOSIS — H353190 Nonexudative age-related macular degeneration, unspecified eye, stage unspecified: Secondary | ICD-10-CM | POA: Diagnosis not present

## 2012-12-26 DIAGNOSIS — E785 Hyperlipidemia, unspecified: Secondary | ICD-10-CM | POA: Diagnosis not present

## 2012-12-26 DIAGNOSIS — I10 Essential (primary) hypertension: Secondary | ICD-10-CM | POA: Diagnosis not present

## 2013-01-01 DIAGNOSIS — Z950 Presence of cardiac pacemaker: Secondary | ICD-10-CM | POA: Diagnosis not present

## 2013-01-01 DIAGNOSIS — E785 Hyperlipidemia, unspecified: Secondary | ICD-10-CM | POA: Diagnosis not present

## 2013-01-01 DIAGNOSIS — I441 Atrioventricular block, second degree: Secondary | ICD-10-CM | POA: Diagnosis not present

## 2013-01-01 DIAGNOSIS — I10 Essential (primary) hypertension: Secondary | ICD-10-CM | POA: Diagnosis not present

## 2013-01-02 ENCOUNTER — Encounter: Payer: Self-pay | Admitting: Internal Medicine

## 2013-01-02 ENCOUNTER — Ambulatory Visit (INDEPENDENT_AMBULATORY_CARE_PROVIDER_SITE_OTHER): Payer: Medicare Other | Admitting: Internal Medicine

## 2013-01-02 VITALS — BP 136/78 | HR 68 | Temp 95.6°F | Ht 63.0 in | Wt 135.0 lb

## 2013-01-02 DIAGNOSIS — I10 Essential (primary) hypertension: Secondary | ICD-10-CM

## 2013-01-02 DIAGNOSIS — G5601 Carpal tunnel syndrome, right upper limb: Secondary | ICD-10-CM

## 2013-01-02 DIAGNOSIS — Z8679 Personal history of other diseases of the circulatory system: Secondary | ICD-10-CM

## 2013-01-02 DIAGNOSIS — E785 Hyperlipidemia, unspecified: Secondary | ICD-10-CM

## 2013-01-02 DIAGNOSIS — G5600 Carpal tunnel syndrome, unspecified upper limb: Secondary | ICD-10-CM

## 2013-01-02 DIAGNOSIS — Z950 Presence of cardiac pacemaker: Secondary | ICD-10-CM | POA: Diagnosis not present

## 2013-01-02 NOTE — Patient Instructions (Addendum)
Wear wrist splint at night for several weeks to see if symptoms improve. You have been diagnosed with carpal tunnel syndrome. Continue same medications. Return in 6 months for physical exam.

## 2013-01-02 NOTE — Progress Notes (Signed)
  Subjective:    Patient ID: [name] [name], female    DOB: Oct 24, 1916, 79 y.o.   MRN: [id number]  HPI  79 year old White female for 6 month recheck on HTN and hyperlipidemia. Hx of pacemaker. Had change out of pacer in June. Followed by Cardiology yearly. Had recent lipid panel liver functions done at [facility] which is entirely normal.Hx of macular degeneration left eye. No longer drives. Lives independently at [facility]. Has been having some problem with numbness in her right hand at night. Seems to get better when she moves it around a bit. No weakness. No injury.    Review of Systems     Objective:   Physical Exam Tinel sign is positive for carpal tunnel syndrome right hand. Chest clear. Cardiac exam regular rate and rhythm. Extremities without edema. Neck is supple without JVD thyromegaly.       Assessment & Plan:  Right carpal tunnel syndrome  History of pacemaker  Hypertension-stable  Hyperlipidemia-stable on statin medication  Plan: I have given her a wrist splint to use at night for several weeks to see if symptoms improve. She is pleased with this. We will not pursue nerve conduction studies etc. at this point in time. I am pleased with her lab results. She will return in 6 months for physical exam. She had influenza vaccine at [facility].  25 minutes spent with patient

## 2013-01-16 DIAGNOSIS — H353290 Exudative age-related macular degeneration, unspecified eye, stage unspecified: Secondary | ICD-10-CM | POA: Diagnosis not present

## 2013-01-16 DIAGNOSIS — H35359 Cystoid macular degeneration, unspecified eye: Secondary | ICD-10-CM | POA: Diagnosis not present

## 2013-01-16 DIAGNOSIS — H31009 Unspecified chorioretinal scars, unspecified eye: Secondary | ICD-10-CM | POA: Diagnosis not present

## 2013-01-16 DIAGNOSIS — H35369 Drusen (degenerative) of macula, unspecified eye: Secondary | ICD-10-CM | POA: Diagnosis not present

## 2013-01-16 DIAGNOSIS — H353190 Nonexudative age-related macular degeneration, unspecified eye, stage unspecified: Secondary | ICD-10-CM | POA: Diagnosis not present

## 2013-03-28 ENCOUNTER — Encounter: Payer: Self-pay | Admitting: Internal Medicine

## 2013-03-28 ENCOUNTER — Ambulatory Visit (INDEPENDENT_AMBULATORY_CARE_PROVIDER_SITE_OTHER): Payer: Medicare Other | Admitting: Internal Medicine

## 2013-03-28 VITALS — BP 148/78 | HR 76 | Temp 97.7°F | Wt 135.5 lb

## 2013-03-28 DIAGNOSIS — H6120 Impacted cerumen, unspecified ear: Secondary | ICD-10-CM

## 2013-03-28 DIAGNOSIS — H8110 Benign paroxysmal vertigo, unspecified ear: Secondary | ICD-10-CM

## 2013-03-28 DIAGNOSIS — Z8679 Personal history of other diseases of the circulatory system: Secondary | ICD-10-CM

## 2013-03-28 DIAGNOSIS — Z950 Presence of cardiac pacemaker: Secondary | ICD-10-CM

## 2013-03-28 DIAGNOSIS — H9191 Unspecified hearing loss, right ear: Secondary | ICD-10-CM

## 2013-03-28 DIAGNOSIS — H9190 Unspecified hearing loss, unspecified ear: Secondary | ICD-10-CM | POA: Diagnosis not present

## 2013-03-28 DIAGNOSIS — H6121 Impacted cerumen, right ear: Secondary | ICD-10-CM

## 2013-03-28 MED ORDER — MECLIZINE HCL 25 MG PO TABS: ORAL_TABLET | ORAL | Status: DC

## 2013-03-28 NOTE — Progress Notes (Signed)
   Subjective:    Patient ID: [name] [name], female    DOB: Jun 04, 1916, 80 y.o.   MRN: [id number]  HPI 80 year old Female lives at [facility] with history of pacemaker. Present for 2 week history of vertigo. This occurs at night when she's lying down and turning from side to side. No recent history of respiratory infection. No cough or congestion. Many years ago had issues with vertigo. No syncope. No chest pain. No headache. No nausea and vomiting.    Review of Systems     Objective:   Physical Exam PERRLA. Wears hearing aid right ear. Right TM is obscured by cerumen. She will have this cleaned out. Left TM clear. Neck is supple without JVD thyromegaly or carotid bruits. Chest clear. Cardiac exam regular rate and rhythm normal S1 and S2. She is alert and oriented x3. She ambulates well for her age. No nystagmus identified. Extraocular movements are full. No facial asymmetry. She is alert and oriented x3. Muscle strength is normal. Moves all 4 extremities. Cranial nerves II through XII grossly intact. No ataxia. Reflexes present and within normal limits.        Assessment & Plan:  Probable benign positional vertigo  History of pacemaker  Hearing loss right ear  Impacted cerumen right external ear  Plan: Try Antivert 25 mg at bedtime. Call if not better in 2 weeks. 25 minutes spent evaluating patient.

## 2013-03-28 NOTE — Patient Instructions (Signed)
Take Antiivert as directed. Call if not better in 2 weeks

## 2013-04-09 DIAGNOSIS — Z950 Presence of cardiac pacemaker: Secondary | ICD-10-CM | POA: Diagnosis not present

## 2013-04-09 DIAGNOSIS — I441 Atrioventricular block, second degree: Secondary | ICD-10-CM | POA: Diagnosis not present

## 2013-04-09 DIAGNOSIS — I10 Essential (primary) hypertension: Secondary | ICD-10-CM | POA: Diagnosis not present

## 2013-04-09 DIAGNOSIS — E785 Hyperlipidemia, unspecified: Secondary | ICD-10-CM | POA: Diagnosis not present

## 2013-04-29 DIAGNOSIS — L821 Other seborrheic keratosis: Secondary | ICD-10-CM | POA: Diagnosis not present

## 2013-04-29 DIAGNOSIS — D235 Other benign neoplasm of skin of trunk: Secondary | ICD-10-CM | POA: Diagnosis not present

## 2013-04-29 DIAGNOSIS — Z85820 Personal history of malignant melanoma of skin: Secondary | ICD-10-CM | POA: Diagnosis not present

## 2013-04-29 DIAGNOSIS — H61009 Unspecified perichondritis of external ear, unspecified ear: Secondary | ICD-10-CM | POA: Diagnosis not present

## 2013-05-05 ENCOUNTER — Encounter: Payer: Self-pay | Admitting: Cardiology

## 2013-05-05 DIAGNOSIS — Z950 Presence of cardiac pacemaker: Secondary | ICD-10-CM | POA: Diagnosis not present

## 2013-05-05 DIAGNOSIS — E785 Hyperlipidemia, unspecified: Secondary | ICD-10-CM | POA: Diagnosis not present

## 2013-05-05 DIAGNOSIS — I441 Atrioventricular block, second degree: Secondary | ICD-10-CM | POA: Diagnosis not present

## 2013-05-05 DIAGNOSIS — I10 Essential (primary) hypertension: Secondary | ICD-10-CM | POA: Diagnosis not present

## 2013-05-05 NOTE — Progress Notes (Signed)
Patient ID: [name] [name], female   DOB: [birth year], 80 y.o.   MRN: [id number]   [name], [name]  Date of visit:  05/05/2013 DOB:  1916/12/06    Age:  80 yrs. Medical record number:  [id number]     Account number:  [id number] Primary Care Provider: Emeline General ____________________________ CURRENT DIAGNOSES  1. Atrial fibrillation  2. Hypertension-Essential (Benign)  3. Conduction Disorder-Mobitz Type II  4. Pacemaker/cardiac insitu ____________________________ ALLERGIES  Codeine, Intolerance-unknown  FOSAMAX, Nausea ____________________________ MEDICATIONS  1. hydrochlorothiazide 12.5 mg tablet, 1 p.o. q.d.  2. lisinopril 10 mg tablet, 1 p.o. q.d.  3. Pravachol 20 mg tablet, 1 p.o. q.d.  4. Calcium 600 600 mg (1,500 mg) tablet, BID  5. Colace 100 mg Capsule, PRN  6. Ocuvite 150-30-6-150 mg-unit-mg-mg capsule, BID  7. aspirin 81 mg tablet,chewable, 1 p.o. daily ____________________________ CHIEF COMPLAINTS  Followup of Hypertension-Essential (Benign)  Followup of Pacemaker/cardiac insitu ____________________________ HISTORY OF PRESENT ILLNESS Patient seen for cardiac followup. She has had a good year since she was previously here. I had her come in early because noted some atrial fibrillation on her pacemaker although she is having it less than 1% of the time. She currently is feeling better and denies angina. She has no PND, orthopnea, syncope, or claudication. Pacemaker function has been fine previously. She did have some vertigo recently. ____________________________ PAST HISTORY  Past Medical Illnesses:  hypertension, hyperlipidemia, osteoporosis, lumbar disc disease;  Cardiovascular Illnesses:  syncope, Mobitz 2 heart block;  Surgical Procedures:  breast lumpectomy, tubes bil ear, cataract extraction OU;  Cardiology Procedures-Invasive:  Medtronic pacemaker implant February 2006;  Cardiology Procedures-Noninvasive:  holter monitor September 2005, echocardiogram October 2005, event  monitor January 2006;  LVEF of 60% documented via echocardiogram on 12/14/2003,   ____________________________ CARDIO-PULMONARY TEST DATES EKG Date:  09/06/2012;  Holter/Event Monitor Date: 03/02/2004;  Echocardiography Date: 12/14/2003;  Chest Xray Date: 07/21/2008;   ____________________________ FAMILY HISTORY Brother -- Brother alive with problem, Cancer Father -- Father dead, kidney disease Mother -- Mother dead, CVA ____________________________ SOCIAL HISTORY Alcohol Use:  wine 1 per day;  Smoking:  used to smoke but quit Prior to 1980;  Diet:  regular diet;  Lifestyle:  widowed and 3 sons;  Exercise:  walking;  Occupation:  retired Camera operator;  Residence:  lives at [facility];   ____________________________ REVIEW OF SYSTEMS General:  malaise and fatigue Eyes: cataract extraction bilaterally, denies diplopia, history of glaucoma or visual field defects. Ears, Nose, Throat, Mouth:  partial hearing loss Respiratory: mild dyspnea with exertion Cardiovascular:  please review HPI Abdominal: denies dyspepsia, GI bleeding, constipation, or diarrheaGenitourinary-Female: nocturia, urgency Musculoskeletal:  mild chronic low back pain, osteoporosis Neurological:  vertigo  ____________________________ PHYSICAL EXAMINATION VITAL SIGNS  Blood Pressure:  132/70 Sitting, Right arm, regular cuff  , 128/70 Standing, Right arm and regular cuff   Pulse:  76/min. Weight:  137.00 lbs. Height:  63"BMI: 24  Constitutional:  pleasant white female, in no acute distress Skin:  warm and dry to touch, no apparent skin lesions, or masses noted. Head:  normocephalic, normal hair pattern, no masses or tenderness ENT:  ears, nose and throat reveal no gross abnormalities.  Dentition good. Neck:  supple, no masses, thyromegaly, JVD. Carotid pulses are full and equal bilaterally without bruits. Chest:  healed pacemaker incision in the left pectoral area, clear to auscultation Cardiac:  regular rhythm, normal S1  and S2, No S3 or S4, no murmurs, gallops or rubs detected. Peripheral Pulses:  pulses  full and equal in all extremities Extremities & Back:  mild bilateral venous insufficiency changes present Neurological:  no gross motor or sensory deficits noted, affect appropriate, oriented x3. ____________________________ MOST RECENT LIPID PANEL 04/19/10  CHOL TOTL 195 mg/dl, LDL 111 NM, HDL 55 mg/dl, TRIGLYCER 145 mg/dl, CHOL/HDL 3.5 (Calc) ____________________________ IMPRESSIONS/PLAN  1. Functioning pacemaker for second-degree heart block 2. Controlled hypertension 3. Hyperlipidemia under treatment 4. Rare episodes of atrial fibrillation noted on pacemaker  Recommendations:  Return visit in 6 months. We will continue to monitor the frequency of atrial fibrillation but at this point in time and her age I don't think that she needs to be on anticoagulation with the infrequency of that. ____________________________ TODAYS ORDERS  1. Return Visit: 6 months                       ____________________________ Cardiology Physician:  Kerry Hough MD [facility]

## 2013-06-09 ENCOUNTER — Other Ambulatory Visit: Payer: Self-pay | Admitting: Internal Medicine

## 2013-07-01 DIAGNOSIS — M810 Age-related osteoporosis without current pathological fracture: Secondary | ICD-10-CM | POA: Diagnosis not present

## 2013-07-01 DIAGNOSIS — E785 Hyperlipidemia, unspecified: Secondary | ICD-10-CM | POA: Diagnosis not present

## 2013-07-01 DIAGNOSIS — Z1329 Encounter for screening for other suspected endocrine disorder: Secondary | ICD-10-CM | POA: Diagnosis not present

## 2013-07-01 DIAGNOSIS — I10 Essential (primary) hypertension: Secondary | ICD-10-CM | POA: Diagnosis not present

## 2013-07-07 ENCOUNTER — Ambulatory Visit (INDEPENDENT_AMBULATORY_CARE_PROVIDER_SITE_OTHER): Payer: Medicare Other | Admitting: Internal Medicine

## 2013-07-07 ENCOUNTER — Encounter: Payer: Self-pay | Admitting: Internal Medicine

## 2013-07-07 VITALS — BP 138/68 | HR 76 | Temp 97.6°F | Ht 63.25 in | Wt 135.5 lb

## 2013-07-07 DIAGNOSIS — I10 Essential (primary) hypertension: Secondary | ICD-10-CM

## 2013-07-07 DIAGNOSIS — E785 Hyperlipidemia, unspecified: Secondary | ICD-10-CM

## 2013-07-07 DIAGNOSIS — Z8679 Personal history of other diseases of the circulatory system: Secondary | ICD-10-CM | POA: Diagnosis not present

## 2013-07-07 DIAGNOSIS — Z0000 Encounter for general adult medical examination without abnormal findings: Secondary | ICD-10-CM

## 2013-07-07 DIAGNOSIS — E039 Hypothyroidism, unspecified: Secondary | ICD-10-CM | POA: Diagnosis not present

## 2013-07-07 DIAGNOSIS — R0989 Other specified symptoms and signs involving the circulatory and respiratory systems: Secondary | ICD-10-CM

## 2013-07-07 DIAGNOSIS — H9190 Unspecified hearing loss, unspecified ear: Secondary | ICD-10-CM

## 2013-07-07 DIAGNOSIS — Z950 Presence of cardiac pacemaker: Secondary | ICD-10-CM

## 2013-07-07 DIAGNOSIS — M810 Age-related osteoporosis without current pathological fracture: Secondary | ICD-10-CM

## 2013-07-07 DIAGNOSIS — H9191 Unspecified hearing loss, right ear: Secondary | ICD-10-CM

## 2013-07-07 LAB — POCT URINALYSIS DIPSTICK
Bilirubin, UA: NEGATIVE
GLUCOSE UA: NEGATIVE
Ketones, UA: NEGATIVE
Leukocytes, UA: NEGATIVE
NITRITE UA: NEGATIVE
PH UA: 7
PROTEIN UA: NEGATIVE
RBC UA: NEGATIVE
SPEC GRAV UA: 1.01
UROBILINOGEN UA: NEGATIVE

## 2013-07-07 MED ORDER — LEVOTHYROXINE SODIUM 50 MCG PO TABS: 50.0000 ug | ORAL_TABLET | Freq: Every day | ORAL | Status: DC

## 2013-07-07 NOTE — Patient Instructions (Signed)
Start levothyroxin 0.05 mg daily and have TSH drawn at [facility] in 6 weeks. Otherwise return in 6 months
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
## 2013-08-13 DIAGNOSIS — Z950 Presence of cardiac pacemaker: Secondary | ICD-10-CM | POA: Diagnosis not present

## 2013-08-13 DIAGNOSIS — I441 Atrioventricular block, second degree: Secondary | ICD-10-CM | POA: Diagnosis not present

## 2013-08-13 DIAGNOSIS — E785 Hyperlipidemia, unspecified: Secondary | ICD-10-CM | POA: Diagnosis not present

## 2013-08-13 DIAGNOSIS — I10 Essential (primary) hypertension: Secondary | ICD-10-CM | POA: Diagnosis not present

## 2013-08-19 DIAGNOSIS — E039 Hypothyroidism, unspecified: Secondary | ICD-10-CM | POA: Diagnosis not present

## 2013-08-22 ENCOUNTER — Telehealth: Payer: Self-pay

## 2013-08-22 NOTE — Telephone Encounter (Signed)
Patient informed, TSH is normal at 1.636.

## 2013-08-25 ENCOUNTER — Encounter: Payer: Self-pay | Admitting: Cardiology

## 2013-08-25 DIAGNOSIS — I441 Atrioventricular block, second degree: Secondary | ICD-10-CM | POA: Diagnosis not present

## 2013-08-25 DIAGNOSIS — E039 Hypothyroidism, unspecified: Secondary | ICD-10-CM | POA: Diagnosis not present

## 2013-08-25 DIAGNOSIS — I10 Essential (primary) hypertension: Secondary | ICD-10-CM | POA: Diagnosis not present

## 2013-08-25 DIAGNOSIS — I4891 Unspecified atrial fibrillation: Secondary | ICD-10-CM | POA: Diagnosis not present

## 2013-08-25 DIAGNOSIS — Z950 Presence of cardiac pacemaker: Secondary | ICD-10-CM | POA: Diagnosis not present

## 2013-08-25 DIAGNOSIS — E785 Hyperlipidemia, unspecified: Secondary | ICD-10-CM | POA: Diagnosis not present

## 2013-08-25 NOTE — Progress Notes (Signed)
Patient ID: [name] [name], female   DOB: 1916-12-24, 80 y.o.   MRN: [id number]   Cairra, Badolato  Date of visit:  08/25/2013 DOB:  1916-04-25    Age:  80 yrs. Medical record number:  [id number]     Account number:  [id number] Primary Care Provider: Emeline General ____________________________ CURRENT DIAGNOSES  1. Arrhythmia-Atrial Fibrillation  2. Hypertension-Essential (Benign)  3. Conduction Disorder-Mobitz Type II  4. Pacemaker/cardiac insitu  5. Hypothyroidism  6. Hyperlipidemia ____________________________ ALLERGIES  Codeine, Intolerance-unknown  FOSAMAX, Nausea ____________________________ MEDICATIONS  1. hydrochlorothiazide 12.5 mg tablet, 1 p.o. q.d.  2. lisinopril 10 mg tablet, 1 p.o. q.d.  3. Pravachol 20 mg tablet, 1 p.o. q.d.  4. Calcium 600 600 mg (1,500 mg) tablet, BID  5. Colace 100 mg Capsule, PRN  6. Ocuvite 150-30-6-150 mg-unit-mg-mg capsule, BID  7. aspirin 81 mg tablet,chewable, 1 p.o. daily  8. levothyroxine 50 mcg tablet, 1 p.o. daily  9. metoprolol succinate ER 25 mg tablet,extended release 24 hr, 1 p.o. daily ____________________________ CHIEF COMPLAINTS  Atrial fibrillation  Followup of Pacemaker/cardiac insitu ____________________________ HISTORY OF PRESENT ILLNESS  Patient seen for cardiac followup. She is unsteady on her feet and has to walk with a cane. Interrogation of her pacemaker shown atrial fibrillation occurring at less than 0.3% of the time. The episodes are brief and it has been sometime since she has had a prolonged episode. She denies angina and has no PND, orthopnea or edema. She has no significant claudication. ____________________________ PAST HISTORY  Past Medical Illnesses:  hypertension, hyperlipidemia, osteoporosis, lumbar disc disease, hypothyroidism;  Cardiovascular Illnesses:  syncope, Mobitz 2 heart block, atrial fibrillation-paroxysmal;  Surgical Procedures:  breast lumpectomy, tubes bil ear, cataract extraction OU;  Cardiology  Procedures-Invasive:  Medtronic pacemaker implant February 2006;  Cardiology Procedures-Noninvasive:  holter monitor September 2005, echocardiogram October 2005, event monitor January 2006;  LVEF of 60% documented via echocardiogram on 12/14/2003,   ____________________________ CARDIO-PULMONARY TEST DATES EKG Date:  08/25/2013;  Holter/Event Monitor Date: 03/02/2004;  Echocardiography Date: 12/14/2003;  Chest Xray Date: 07/21/2008;   ____________________________ FAMILY HISTORY Brother -- Brother alive with problem, Cancer Father -- Father dead, kidney disease Mother -- Mother dead, CVA ____________________________ SOCIAL HISTORY Alcohol Use:  wine 1 per day;  Smoking:  used to smoke but quit Prior to 1980;  Diet:  regular diet;  Lifestyle:  widowed and 3 sons;  Exercise:  walking;  Occupation:  retired Camera operator;  Residence:  lives at [facility];   ____________________________ REVIEW OF SYSTEMS General:  malaise and fatigue Eyes: decreased acuity, cataract extraction Ears, Nose, Throat, Mouth:  partial hearing loss Respiratory: mild dyspnea with exertion Cardiovascular:  please review HPI Abdominal: denies dyspepsia, GI bleeding, constipation, or diarrheaGenitourinary-Female: nocturia, urgency Musculoskeletal:  mild chronic low back pain, osteoporosis Neurological:  vertigo  ____________________________ PHYSICAL EXAMINATION VITAL SIGNS  Blood Pressure:  152/70 Sitting, Left arm, regular cuff  , 154/76 Standing, Left arm and regular cuff   Pulse:  88/min. Weight:  136.00 lbs. Height:  63"BMI: 24  Constitutional:  pleasant white female, in no acute distress Skin:  warm and dry to touch, no apparent skin lesions, or masses noted. Head:  normocephalic, normal hair pattern, no masses or tenderness ENT:  ears, nose and throat reveal no gross abnormalities.  Dentition good. Neck:  supple, no masses, thyromegaly, JVD. Carotid pulses are full and equal bilaterally without bruits. Chest:   healed pacemaker incision in the left pectoral area, clear to auscultation Cardiac:  regular rhythm,  normal S1 and S2, No S3 or S4, no murmurs, gallops or rubs detected. Peripheral Pulses:  pulses full and equal in all extremities Extremities & Back:  mild bilateral venous insufficiency changes present Neurological:  unsteady gait ____________________________ MOST RECENT LIPID PANEL 04/19/10  CHOL TOTL 195 mg/dl, LDL 111 NM, HDL 55 mg/dl, TRIGLYCER 145 mg/dl, CHOL/HDL 3.5 (Calc)  ____________________________ IMPRESSIONS/PLAN  1. Functioning permanent pacemaker 2. Hypertension controlled 3. Mild infrequent atrial fibrillation  Recommendations:  Again looked at the frequency of atrial fibrillation that she is having. She is somewhat unsteady on her feet and at this point I am not convinced that the risks of anticoagulation are worth the potential benefits her with you the frequency of atrial fibrillation that she is having. Followup again in 6 months. EKG shows sinus with Paced ventricular beats and PAC's.  ____________________________ TODAYS ORDERS  1. 12 Lead EKG: Today  2. Return Visit: 6 months                       ____________________________ Cardiology Physician:  Kerry Hough MD [facility]

## 2013-09-08 ENCOUNTER — Other Ambulatory Visit: Payer: Self-pay | Admitting: Internal Medicine

## 2013-10-07 ENCOUNTER — Encounter: Payer: Self-pay | Admitting: *Deleted

## 2013-11-04 DIAGNOSIS — L570 Actinic keratosis: Secondary | ICD-10-CM | POA: Diagnosis not present

## 2013-11-04 DIAGNOSIS — D235 Other benign neoplasm of skin of trunk: Secondary | ICD-10-CM | POA: Diagnosis not present

## 2013-11-04 DIAGNOSIS — Z85820 Personal history of malignant melanoma of skin: Secondary | ICD-10-CM | POA: Diagnosis not present

## 2013-12-15 DIAGNOSIS — Z23 Encounter for immunization: Secondary | ICD-10-CM | POA: Diagnosis not present

## 2013-12-23 ENCOUNTER — Encounter: Payer: Self-pay | Admitting: Internal Medicine

## 2013-12-30 DIAGNOSIS — E785 Hyperlipidemia, unspecified: Secondary | ICD-10-CM | POA: Diagnosis not present

## 2013-12-30 DIAGNOSIS — Z1329 Encounter for screening for other suspected endocrine disorder: Secondary | ICD-10-CM | POA: Diagnosis not present

## 2014-01-06 ENCOUNTER — Ambulatory Visit (INDEPENDENT_AMBULATORY_CARE_PROVIDER_SITE_OTHER): Payer: Medicare Other | Admitting: Internal Medicine

## 2014-01-06 ENCOUNTER — Encounter: Payer: Self-pay | Admitting: Internal Medicine

## 2014-01-06 VITALS — BP 138/80 | HR 66 | Temp 96.4°F | Ht 63.0 in | Wt 139.0 lb

## 2014-01-06 DIAGNOSIS — Z23 Encounter for immunization: Secondary | ICD-10-CM

## 2014-01-06 DIAGNOSIS — E039 Hypothyroidism, unspecified: Secondary | ICD-10-CM

## 2014-01-06 MED ORDER — DICYCLOMINE HCL 20 MG PO TABS: ORAL_TABLET | ORAL | Status: DC

## 2014-01-06 MED ORDER — PNEUMOCOCCAL 13-VAL CONJ VACC IM SUSP
0.5000 mL | Freq: Once | INTRAMUSCULAR | Status: AC
  Administered 2014-01-08: 0.5 mL via INTRAMUSCULAR

## 2014-01-06 MED ORDER — LEVOTHYROXINE SODIUM 50 MCG PO TABS: 50.0000 ug | ORAL_TABLET | Freq: Every day | ORAL | Status: DC

## 2014-01-06 MED ORDER — PNEUMOCOCCAL 13-VAL CONJ VACC IM SUSP
0.5000 mL | INTRAMUSCULAR | Status: DC
  Administered 2014-01-06: 0.5 mL via INTRAMUSCULAR

## 2014-01-07 DIAGNOSIS — E039 Hypothyroidism, unspecified: Secondary | ICD-10-CM | POA: Insufficient documentation

## 2014-01-07 NOTE — Patient Instructions (Signed)
Continue same medications and return in 6 months 

## 2014-01-07 NOTE — Progress Notes (Signed)
   Subjective:    Patient ID: [name] [name], female    DOB: [birth year], 80 y.o.   MRN: [id number]  HPI  80 year old White Female who resides at [facility] in today for six-month recheck. Was diagnosed with new onset hypothyroidism spring 2015. Is on levothyroxin 0.05 mg daily. TSH is stable. Labs [facility] done at retirement facility. Lipid panel liver functions are within normal limits. She feels well. Memory is good. No new complaints.    Review of Systems     Objective:   Physical Exam  Skin warm and dry. Nodes none. No thyromegaly. Chest clear. Cardiac exam regular rate and rhythm normal S1 and S2. Extremities without edema  .Alert and oriented 3.     Assessment & Plan:  Hypothyroidism-stable on thyroid replacement  Hypertension-stable on current regimen  Hyperlipidemia -stable on statin medication  History of pacemaker-Dr. Wynonia Lawman is cardiologist.  Plan: Continue same medications and return in May 2016 for physical examination.  25 minutes spent with patient.

## 2014-01-08 DIAGNOSIS — I480 Paroxysmal atrial fibrillation: Secondary | ICD-10-CM | POA: Diagnosis not present

## 2014-01-08 DIAGNOSIS — E039 Hypothyroidism, unspecified: Secondary | ICD-10-CM | POA: Diagnosis not present

## 2014-01-08 DIAGNOSIS — I441 Atrioventricular block, second degree: Secondary | ICD-10-CM | POA: Diagnosis not present

## 2014-01-08 DIAGNOSIS — I10 Essential (primary) hypertension: Secondary | ICD-10-CM | POA: Diagnosis not present

## 2014-01-08 DIAGNOSIS — E784 Other hyperlipidemia: Secondary | ICD-10-CM | POA: Diagnosis not present

## 2014-01-08 DIAGNOSIS — Z950 Presence of cardiac pacemaker: Secondary | ICD-10-CM | POA: Diagnosis not present

## 2014-01-15 DIAGNOSIS — H3532 Exudative age-related macular degeneration: Secondary | ICD-10-CM | POA: Diagnosis not present

## 2014-01-15 DIAGNOSIS — H35363 Drusen (degenerative) of macula, bilateral: Secondary | ICD-10-CM | POA: Diagnosis not present

## 2014-01-15 DIAGNOSIS — H35053 Retinal neovascularization, unspecified, bilateral: Secondary | ICD-10-CM | POA: Diagnosis not present

## 2014-02-05 ENCOUNTER — Encounter (HOSPITAL_COMMUNITY): Payer: Self-pay | Admitting: Internal Medicine

## 2014-02-16 ENCOUNTER — Encounter: Payer: Self-pay | Admitting: Cardiology

## 2014-02-16 DIAGNOSIS — I480 Paroxysmal atrial fibrillation: Secondary | ICD-10-CM | POA: Diagnosis not present

## 2014-02-16 DIAGNOSIS — I441 Atrioventricular block, second degree: Secondary | ICD-10-CM | POA: Diagnosis not present

## 2014-02-16 DIAGNOSIS — I10 Essential (primary) hypertension: Secondary | ICD-10-CM | POA: Diagnosis not present

## 2014-02-16 DIAGNOSIS — E784 Other hyperlipidemia: Secondary | ICD-10-CM | POA: Diagnosis not present

## 2014-02-16 DIAGNOSIS — E039 Hypothyroidism, unspecified: Secondary | ICD-10-CM | POA: Diagnosis not present

## 2014-02-16 DIAGNOSIS — Z950 Presence of cardiac pacemaker: Secondary | ICD-10-CM | POA: Diagnosis not present

## 2014-02-16 NOTE — Progress Notes (Signed)
Patient ID: [name] [name], female   DOB: 08-05-1916, 80 y.o.   MRN: [id number]   Tanyiah, Krontz  Date of visit:  02/16/2014 DOB:  [birth year]    Age:  80 yrs. Medical record number:  [id number]     Account number:  [id number] Primary Care Provider: Emeline General ____________________________ CURRENT DIAGNOSES  1. Paroxysmal atrial fibrillation  2. Essential (primary) hypertension  3. Atrioventricular block, second degree  4. Presence of cardiac pacemaker  5. Hypothyroidism, unspecified  6. Other hyperlipidemia ____________________________ ALLERGIES  Codeine, Intolerance-unknown  FOSAMAX, Nausea ____________________________ MEDICATIONS  1. hydrochlorothiazide 12.5 mg tablet, 1 p.o. q.d.  2. lisinopril 10 mg tablet, 1 p.o. q.d.  3. Pravachol 20 mg tablet, 1 p.o. q.d.  4. Calcium 600 600 mg (1,500 mg) tablet, BID  5. Colace 100 mg Capsule, PRN  6. Ocuvite 150-30-6-150 mg-unit-mg-mg capsule, BID  7. aspirin 81 mg tablet,chewable, 1 p.o. daily  8. levothyroxine 50 mcg tablet, 1 p.o. daily  9. metoprolol succinate ER 25 mg tablet,extended release 24 hr, 1 p.o. daily ____________________________ CHIEF COMPLAINTS  Followup of Essential (primary) hypertension  Followup of Paroxysmal atrial fibrillation  Followup of Presence of cardiac pacemaker ____________________________ HISTORY OF PRESENT ILLNESS Patient seen for cardiac followup. She has been doing well since she was previously here. She denies angina and has no PND, orthopnea, syncope, palpitations, or claudication. She is somewhat limited and has to walk with a walker. She denies other significant symptoms. ____________________________ PAST HISTORY  Past Medical Illnesses:  hypertension, hyperlipidemia, osteoporosis, lumbar disc disease, hypothyroidism;  Cardiovascular Illnesses:  syncope, Mobitz 2 heart block, atrial fibrillation-paroxysmal;  Surgical Procedures:  breast lumpectomy, tubes bil ear, cataract extraction OU;  NYHA  Classification:  I;  Canadian Angina Classification:  Class 0: Asymptomatic;  Cardiology Procedures-Invasive:  Medtronic pacemaker implant February 2006;  Cardiology Procedures-Noninvasive:  holter monitor September 2005, echocardiogram October 2005, event monitor January 2006;  LVEF of 60% documented via echocardiogram on 12/14/2003,   ____________________________ CARDIO-PULMONARY TEST DATES EKG Date:  08/25/2013;  Holter/Event Monitor Date: 03/02/2004;  Echocardiography Date: 12/14/2003;  Chest Xray Date: 07/21/2008;   ____________________________ FAMILY HISTORY Brother -- Brother alive with problem, Cancer Father -- Father dead, kidney disease Mother -- Mother dead, CVA ____________________________ SOCIAL HISTORY Alcohol Use:  wine 1 per day;  Smoking:  used to smoke but quit Prior to 1980;  Diet:  regular diet;  Lifestyle:  widowed and 3 sons;  Exercise:  walking;  Occupation:  retired Camera operator;  Residence:  lives at [facility];   ____________________________ REVIEW OF SYSTEMS General:  malaise and fatigue Eyes: decreased acuity, cataract extraction Ears, Nose, Throat, Mouth:  partial hearing loss Respiratory: mild dyspnea with exertion Cardiovascular:  please review HPI Abdominal: denies dyspepsia, GI bleeding, constipation, or diarrheaGenitourinary-Female: nocturia, urgency Musculoskeletal:  mild chronic low back pain, osteoporosis  ____________________________ PHYSICAL EXAMINATION VITAL SIGNS  Blood Pressure:  136/64 Sitting, Right arm, regular cuff  , 132/60 Standing, Right arm and regular cuff   Pulse:  70/min. Weight:  138.00 lbs. Height:  63"BMI: 24  Constitutional:  pleasant white female, in no acute distress Skin:  warm and dry to touch, no apparent skin lesions, or masses noted. Head:  normocephalic, normal hair pattern, no masses or tenderness ENT:  ears, nose and throat reveal no gross abnormalities.  Dentition good. Neck:  supple, no masses, thyromegaly, JVD. Carotid  pulses are full and equal bilaterally without bruits. Chest:  healed pacemaker incision in the left pectoral area, clear to  auscultation Cardiac:  regular rhythm, normal S1 and S2, No S3 or S4, no murmurs, gallops or rubs detected. Peripheral Pulses:  pulses full and equal in all extremities Extremities & Back:  mild bilateral venous insufficiency changes present Neurological:  unsteady gait ____________________________ MOST RECENT LIPID PANEL 04/19/10  CHOL TOTL 195 mg/dl, LDL 111 NM, HDL 55 mg/dl, TRIGLYCER 145 mg/dl, CHOL/HDL 3.5 (Calc)  ____________________________ IMPRESSIONS/PLAN  1. Hypertension currently controlled 2. Functioning permanent pacemaker 3. Paroxysmal atrial fibrillation with minimal recurrence following addition of beta blocker  Recommendations:  Continue beta blocker. Opted not to systemically anticoagulate due to the infrequency of the atrial fibrillation. Followup in one year. Continue remote pacemaker followup. ____________________________ TODAYS ORDERS  1. Return Visit: 1 year                       ____________________________ Cardiology Physician:  Kerry Hough MD [facility]

## 2014-04-09 DIAGNOSIS — Z950 Presence of cardiac pacemaker: Secondary | ICD-10-CM | POA: Diagnosis not present

## 2014-04-09 DIAGNOSIS — E039 Hypothyroidism, unspecified: Secondary | ICD-10-CM | POA: Diagnosis not present

## 2014-04-09 DIAGNOSIS — I10 Essential (primary) hypertension: Secondary | ICD-10-CM | POA: Diagnosis not present

## 2014-04-09 DIAGNOSIS — E784 Other hyperlipidemia: Secondary | ICD-10-CM | POA: Diagnosis not present

## 2014-04-09 DIAGNOSIS — I441 Atrioventricular block, second degree: Secondary | ICD-10-CM | POA: Diagnosis not present

## 2014-04-09 DIAGNOSIS — I480 Paroxysmal atrial fibrillation: Secondary | ICD-10-CM | POA: Diagnosis not present

## 2014-04-22 DIAGNOSIS — C44320 Squamous cell carcinoma of skin of unspecified parts of face: Secondary | ICD-10-CM | POA: Diagnosis not present

## 2014-04-22 DIAGNOSIS — C44329 Squamous cell carcinoma of skin of other parts of face: Secondary | ICD-10-CM | POA: Diagnosis not present

## 2014-04-22 DIAGNOSIS — Z85828 Personal history of other malignant neoplasm of skin: Secondary | ICD-10-CM | POA: Diagnosis not present

## 2014-04-22 DIAGNOSIS — Z08 Encounter for follow-up examination after completed treatment for malignant neoplasm: Secondary | ICD-10-CM | POA: Diagnosis not present

## 2014-05-01 DIAGNOSIS — H02831 Dermatochalasis of right upper eyelid: Secondary | ICD-10-CM | POA: Diagnosis not present

## 2014-05-01 DIAGNOSIS — H02834 Dermatochalasis of left upper eyelid: Secondary | ICD-10-CM | POA: Diagnosis not present

## 2014-05-01 DIAGNOSIS — H40013 Open angle with borderline findings, low risk, bilateral: Secondary | ICD-10-CM | POA: Diagnosis not present

## 2014-05-01 DIAGNOSIS — H3531 Nonexudative age-related macular degeneration: Secondary | ICD-10-CM | POA: Diagnosis not present

## 2014-05-01 DIAGNOSIS — Z961 Presence of intraocular lens: Secondary | ICD-10-CM | POA: Diagnosis not present

## 2014-06-02 DIAGNOSIS — Z08 Encounter for follow-up examination after completed treatment for malignant neoplasm: Secondary | ICD-10-CM | POA: Diagnosis not present

## 2014-06-02 DIAGNOSIS — Z85828 Personal history of other malignant neoplasm of skin: Secondary | ICD-10-CM | POA: Diagnosis not present

## 2014-06-25 DIAGNOSIS — E039 Hypothyroidism, unspecified: Secondary | ICD-10-CM | POA: Diagnosis not present

## 2014-06-25 DIAGNOSIS — E785 Hyperlipidemia, unspecified: Secondary | ICD-10-CM | POA: Diagnosis not present

## 2014-06-25 DIAGNOSIS — E46 Unspecified protein-calorie malnutrition: Secondary | ICD-10-CM | POA: Diagnosis not present

## 2014-06-25 DIAGNOSIS — Z79899 Other long term (current) drug therapy: Secondary | ICD-10-CM | POA: Diagnosis not present

## 2014-06-25 DIAGNOSIS — E780 Pure hypercholesterolemia: Secondary | ICD-10-CM | POA: Diagnosis not present

## 2014-06-25 DIAGNOSIS — R5383 Other fatigue: Secondary | ICD-10-CM | POA: Diagnosis not present

## 2014-06-25 DIAGNOSIS — I10 Essential (primary) hypertension: Secondary | ICD-10-CM | POA: Diagnosis not present

## 2014-06-25 DIAGNOSIS — E559 Vitamin D deficiency, unspecified: Secondary | ICD-10-CM | POA: Diagnosis not present

## 2014-06-29 ENCOUNTER — Other Ambulatory Visit: Payer: PRIVATE HEALTH INSURANCE | Admitting: Internal Medicine

## 2014-07-02 ENCOUNTER — Encounter: Payer: Self-pay | Admitting: Internal Medicine

## 2014-07-02 ENCOUNTER — Ambulatory Visit (INDEPENDENT_AMBULATORY_CARE_PROVIDER_SITE_OTHER): Payer: Medicare Other | Admitting: Internal Medicine

## 2014-07-02 VITALS — BP 138/80 | HR 85 | Temp 97.9°F | Ht 63.0 in | Wt 137.0 lb

## 2014-07-02 DIAGNOSIS — I10 Essential (primary) hypertension: Secondary | ICD-10-CM | POA: Diagnosis not present

## 2014-07-02 DIAGNOSIS — E785 Hyperlipidemia, unspecified: Secondary | ICD-10-CM

## 2014-07-02 DIAGNOSIS — Z950 Presence of cardiac pacemaker: Secondary | ICD-10-CM | POA: Diagnosis not present

## 2014-07-02 DIAGNOSIS — E039 Hypothyroidism, unspecified: Secondary | ICD-10-CM | POA: Diagnosis not present

## 2014-07-02 DIAGNOSIS — H9191 Unspecified hearing loss, right ear: Secondary | ICD-10-CM | POA: Diagnosis not present

## 2014-07-02 DIAGNOSIS — H3530 Unspecified macular degeneration: Secondary | ICD-10-CM | POA: Insufficient documentation

## 2014-07-02 DIAGNOSIS — R0989 Other specified symptoms and signs involving the circulatory and respiratory systems: Secondary | ICD-10-CM

## 2014-07-02 DIAGNOSIS — Z0000 Encounter for general adult medical examination without abnormal findings: Secondary | ICD-10-CM

## 2014-07-02 LAB — POCT URINALYSIS DIPSTICK
BILIRUBIN UA: NEGATIVE
Blood, UA: NEGATIVE
Glucose, UA: NEGATIVE
KETONES UA: NEGATIVE
LEUKOCYTES UA: NEGATIVE
Nitrite, UA: NEGATIVE
PH UA: 6.5
PROTEIN UA: NEGATIVE
SPEC GRAV UA: 1.01
Urobilinogen, UA: NEGATIVE
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
## 2014-07-03 ENCOUNTER — Other Ambulatory Visit: Payer: Self-pay | Admitting: Internal Medicine

## 2014-07-09 DIAGNOSIS — E039 Hypothyroidism, unspecified: Secondary | ICD-10-CM | POA: Diagnosis not present

## 2014-07-09 DIAGNOSIS — I480 Paroxysmal atrial fibrillation: Secondary | ICD-10-CM | POA: Diagnosis not present

## 2014-07-09 DIAGNOSIS — I10 Essential (primary) hypertension: Secondary | ICD-10-CM | POA: Diagnosis not present

## 2014-07-09 DIAGNOSIS — I441 Atrioventricular block, second degree: Secondary | ICD-10-CM | POA: Diagnosis not present

## 2014-07-09 DIAGNOSIS — Z950 Presence of cardiac pacemaker: Secondary | ICD-10-CM | POA: Diagnosis not present

## 2014-07-09 DIAGNOSIS — E784 Other hyperlipidemia: Secondary | ICD-10-CM | POA: Diagnosis not present

## 2014-07-14 DIAGNOSIS — H3532 Exudative age-related macular degeneration: Secondary | ICD-10-CM | POA: Diagnosis not present

## 2014-07-14 DIAGNOSIS — H3531 Nonexudative age-related macular degeneration: Secondary | ICD-10-CM | POA: Diagnosis not present

## 2014-07-14 DIAGNOSIS — H35363 Drusen (degenerative) of macula, bilateral: Secondary | ICD-10-CM | POA: Diagnosis not present

## 2014-08-26 NOTE — Patient Instructions (Signed)
Continue same medications and return in 6 months. It was a pleasure to see you today.

## 2014-09-08 ENCOUNTER — Other Ambulatory Visit: Payer: Self-pay | Admitting: Internal Medicine

## 2014-10-07 DIAGNOSIS — I10 Essential (primary) hypertension: Secondary | ICD-10-CM | POA: Diagnosis not present

## 2014-10-07 DIAGNOSIS — E039 Hypothyroidism, unspecified: Secondary | ICD-10-CM | POA: Diagnosis not present

## 2014-10-07 DIAGNOSIS — Z950 Presence of cardiac pacemaker: Secondary | ICD-10-CM | POA: Diagnosis not present

## 2014-10-07 DIAGNOSIS — E784 Other hyperlipidemia: Secondary | ICD-10-CM | POA: Diagnosis not present

## 2014-10-07 DIAGNOSIS — I441 Atrioventricular block, second degree: Secondary | ICD-10-CM | POA: Diagnosis not present

## 2014-10-07 DIAGNOSIS — I480 Paroxysmal atrial fibrillation: Secondary | ICD-10-CM | POA: Diagnosis not present

## 2014-11-26 DIAGNOSIS — Z23 Encounter for immunization: Secondary | ICD-10-CM | POA: Diagnosis not present

## 2014-12-07 ENCOUNTER — Other Ambulatory Visit: Payer: Self-pay | Admitting: Internal Medicine

## 2014-12-29 ENCOUNTER — Other Ambulatory Visit: Payer: Self-pay | Admitting: Internal Medicine

## 2014-12-31 DIAGNOSIS — E785 Hyperlipidemia, unspecified: Secondary | ICD-10-CM | POA: Diagnosis not present

## 2014-12-31 DIAGNOSIS — Z79899 Other long term (current) drug therapy: Secondary | ICD-10-CM | POA: Diagnosis not present

## 2014-12-31 DIAGNOSIS — I10 Essential (primary) hypertension: Secondary | ICD-10-CM | POA: Diagnosis not present

## 2014-12-31 DIAGNOSIS — E039 Hypothyroidism, unspecified: Secondary | ICD-10-CM | POA: Diagnosis not present

## 2015-01-07 ENCOUNTER — Ambulatory Visit (INDEPENDENT_AMBULATORY_CARE_PROVIDER_SITE_OTHER): Payer: Medicare Other | Admitting: Internal Medicine

## 2015-01-07 ENCOUNTER — Encounter: Payer: Self-pay | Admitting: Internal Medicine

## 2015-01-07 VITALS — BP 132/80 | HR 78 | Temp 97.3°F | Resp 18 | Ht 63.0 in | Wt 138.0 lb

## 2015-01-07 DIAGNOSIS — E039 Hypothyroidism, unspecified: Secondary | ICD-10-CM | POA: Diagnosis not present

## 2015-01-07 DIAGNOSIS — E784 Other hyperlipidemia: Secondary | ICD-10-CM | POA: Diagnosis not present

## 2015-01-07 DIAGNOSIS — H3530 Unspecified macular degeneration: Secondary | ICD-10-CM | POA: Diagnosis not present

## 2015-01-07 DIAGNOSIS — E785 Hyperlipidemia, unspecified: Secondary | ICD-10-CM

## 2015-01-07 DIAGNOSIS — R0989 Other specified symptoms and signs involving the circulatory and respiratory systems: Secondary | ICD-10-CM | POA: Diagnosis not present

## 2015-01-07 DIAGNOSIS — I441 Atrioventricular block, second degree: Secondary | ICD-10-CM | POA: Diagnosis not present

## 2015-01-07 DIAGNOSIS — I10 Essential (primary) hypertension: Secondary | ICD-10-CM | POA: Diagnosis not present

## 2015-01-07 DIAGNOSIS — I480 Paroxysmal atrial fibrillation: Secondary | ICD-10-CM | POA: Diagnosis not present

## 2015-01-07 DIAGNOSIS — H9191 Unspecified hearing loss, right ear: Secondary | ICD-10-CM | POA: Diagnosis not present

## 2015-01-07 DIAGNOSIS — Z950 Presence of cardiac pacemaker: Secondary | ICD-10-CM | POA: Diagnosis not present

## 2015-01-14 DIAGNOSIS — H353222 Exudative age-related macular degeneration, left eye, with inactive choroidal neovascularization: Secondary | ICD-10-CM | POA: Diagnosis not present

## 2015-01-14 DIAGNOSIS — H353134 Nonexudative age-related macular degeneration, bilateral, advanced atrophic with subfoveal involvement: Secondary | ICD-10-CM | POA: Diagnosis not present

## 2015-01-26 DIAGNOSIS — Z1283 Encounter for screening for malignant neoplasm of skin: Secondary | ICD-10-CM | POA: Diagnosis not present

## 2015-01-26 DIAGNOSIS — Z85828 Personal history of other malignant neoplasm of skin: Secondary | ICD-10-CM | POA: Diagnosis not present

## 2015-01-26 DIAGNOSIS — Z08 Encounter for follow-up examination after completed treatment for malignant neoplasm: Secondary | ICD-10-CM | POA: Diagnosis not present

## 2015-01-26 DIAGNOSIS — Z85820 Personal history of malignant melanoma of skin: Secondary | ICD-10-CM | POA: Diagnosis not present

## 2015-01-27 NOTE — Progress Notes (Signed)
   Subjective:    Patient ID: [name], female    DOB: [birth year], 81 y.o.   MRN: [id number]  HPI Six-month follow-up for this 81 year old White Female who resides at [facility]. She is doing quite well. History of hypertension and hyperlipidemia for many years. She has a pacemaker and is followed by Dr. Wynonia Lawman. No new complaints or problems. She has hypothyroidism. She has a right carotid bruit and macular degeneration. History of loss in right ear. Weight is stable.    Review of Systems     Objective:   Physical Exam  Skin warm and dry. Nodes none. She is alert and oriented. Neck is supple. No thyromegaly. Chest clear to auscultation without rales or wheezing. Cardiac exam unchanged from previous exam. Extremities without edema.      Assessment & Plan:  Essential hypertension-stable  Hypothyroidism-stable  Hyperlipidemia-normal lipid panel  Hearing loss  Macular degeneration  Right carotid bruit  Pacemaker  Plan: She is doing great for her age. Return in 6 months and no changes will be made today in her care plan

## 2015-02-16 DIAGNOSIS — E784 Other hyperlipidemia: Secondary | ICD-10-CM | POA: Diagnosis not present

## 2015-02-16 DIAGNOSIS — E039 Hypothyroidism, unspecified: Secondary | ICD-10-CM | POA: Diagnosis not present

## 2015-02-16 DIAGNOSIS — I10 Essential (primary) hypertension: Secondary | ICD-10-CM | POA: Diagnosis not present

## 2015-02-16 DIAGNOSIS — E785 Hyperlipidemia, unspecified: Secondary | ICD-10-CM | POA: Diagnosis not present

## 2015-02-16 DIAGNOSIS — Z950 Presence of cardiac pacemaker: Secondary | ICD-10-CM | POA: Diagnosis not present

## 2015-02-16 DIAGNOSIS — I480 Paroxysmal atrial fibrillation: Secondary | ICD-10-CM | POA: Diagnosis not present

## 2015-02-16 DIAGNOSIS — I441 Atrioventricular block, second degree: Secondary | ICD-10-CM | POA: Diagnosis not present

## 2015-03-10 ENCOUNTER — Other Ambulatory Visit: Payer: Self-pay | Admitting: Internal Medicine

## 2015-03-28 NOTE — Patient Instructions (Signed)
Continue same medications and return in 6 months. No changes made. It is pleasure to see you today

## 2015-04-08 DIAGNOSIS — I441 Atrioventricular block, second degree: Secondary | ICD-10-CM | POA: Diagnosis not present

## 2015-04-08 DIAGNOSIS — I480 Paroxysmal atrial fibrillation: Secondary | ICD-10-CM | POA: Diagnosis not present

## 2015-04-08 DIAGNOSIS — Z950 Presence of cardiac pacemaker: Secondary | ICD-10-CM | POA: Diagnosis not present

## 2015-04-08 DIAGNOSIS — E785 Hyperlipidemia, unspecified: Secondary | ICD-10-CM | POA: Diagnosis not present

## 2015-04-08 DIAGNOSIS — I10 Essential (primary) hypertension: Secondary | ICD-10-CM | POA: Diagnosis not present

## 2015-04-08 DIAGNOSIS — E039 Hypothyroidism, unspecified: Secondary | ICD-10-CM | POA: Diagnosis not present

## 2015-04-20 ENCOUNTER — Ambulatory Visit (INDEPENDENT_AMBULATORY_CARE_PROVIDER_SITE_OTHER): Payer: Medicare Other | Admitting: Internal Medicine

## 2015-04-20 ENCOUNTER — Encounter: Payer: Self-pay | Admitting: Internal Medicine

## 2015-04-20 VITALS — BP 104/50 | HR 87 | Temp 99.0°F | Resp 20 | Wt 137.0 lb

## 2015-04-20 DIAGNOSIS — J069 Acute upper respiratory infection, unspecified: Secondary | ICD-10-CM

## 2015-04-20 DIAGNOSIS — R197 Diarrhea, unspecified: Secondary | ICD-10-CM

## 2015-04-20 MED ORDER — AMOXICILLIN 250 MG PO CAPS: 250.0000 mg | ORAL_CAPSULE | Freq: Three times a day (TID) | ORAL | Status: DC

## 2015-04-20 NOTE — Patient Instructions (Signed)
Amoxicillin 250 mg 3 times daily for 10 days. Clear liquids or soft diet if diarrhea persist. Robitussin for cough. Rest and drink plenty of fluids. Call if not better in 7-10 days or sooner if worse

## 2015-04-20 NOTE — Progress Notes (Signed)
   Subjective:    Patient ID: [name] [name], female    DOB: 1916/04/02, 82 y.o.   MRN: [id number]  HPI Onset Thursday, February 16 of URI symptoms. A lot of people at [facility] have been sick with colds. She also had one bout of diarrhea where she sioled the bed. No fever or shaking chills. Denies sore throat. Cough with clear sputum production. Has malaise and fatigue.    Review of Systems     Objective:   Physical Exam  Skin warm and dry. Nodes none. Pharynx is clear. TMs not checked. Neck is supple. Chest clear to auscultation without rales or wheezing. Extremities without edema.      Assessment & Plan:  Acute URI  Diarrhea  Plan: May want to stay with clear liquids or soft diet for diarrhea if it persist. She took Imodium today. Prescribed Amoxicillin 250 mg 3 times daily for 10 days. She has Robitussin for cough.

## 2015-05-21 DIAGNOSIS — Z961 Presence of intraocular lens: Secondary | ICD-10-CM | POA: Diagnosis not present

## 2015-05-21 DIAGNOSIS — H40013 Open angle with borderline findings, low risk, bilateral: Secondary | ICD-10-CM | POA: Diagnosis not present

## 2015-05-21 DIAGNOSIS — H353123 Nonexudative age-related macular degeneration, left eye, advanced atrophic without subfoveal involvement: Secondary | ICD-10-CM | POA: Diagnosis not present

## 2015-05-21 DIAGNOSIS — H353112 Nonexudative age-related macular degeneration, right eye, intermediate dry stage: Secondary | ICD-10-CM | POA: Diagnosis not present

## 2015-06-10 ENCOUNTER — Other Ambulatory Visit: Payer: Self-pay | Admitting: Internal Medicine

## 2015-06-10 NOTE — Telephone Encounter (Signed)
Refill x 6 months 

## 2015-07-08 DIAGNOSIS — Z950 Presence of cardiac pacemaker: Secondary | ICD-10-CM | POA: Diagnosis not present

## 2015-07-08 DIAGNOSIS — I441 Atrioventricular block, second degree: Secondary | ICD-10-CM | POA: Diagnosis not present

## 2015-07-08 DIAGNOSIS — E559 Vitamin D deficiency, unspecified: Secondary | ICD-10-CM | POA: Diagnosis not present

## 2015-07-08 DIAGNOSIS — E039 Hypothyroidism, unspecified: Secondary | ICD-10-CM | POA: Diagnosis not present

## 2015-07-08 DIAGNOSIS — I480 Paroxysmal atrial fibrillation: Secondary | ICD-10-CM | POA: Diagnosis not present

## 2015-07-08 DIAGNOSIS — E785 Hyperlipidemia, unspecified: Secondary | ICD-10-CM | POA: Diagnosis not present

## 2015-07-08 DIAGNOSIS — I10 Essential (primary) hypertension: Secondary | ICD-10-CM | POA: Diagnosis not present

## 2015-07-13 ENCOUNTER — Ambulatory Visit (INDEPENDENT_AMBULATORY_CARE_PROVIDER_SITE_OTHER): Payer: Medicare Other | Admitting: Internal Medicine

## 2015-07-13 ENCOUNTER — Encounter: Payer: Self-pay | Admitting: Internal Medicine

## 2015-07-13 VITALS — BP 148/80 | HR 70 | Temp 97.0°F | Resp 20 | Ht 63.0 in | Wt 138.0 lb

## 2015-07-13 DIAGNOSIS — Z0000 Encounter for general adult medical examination without abnormal findings: Secondary | ICD-10-CM | POA: Diagnosis not present

## 2015-07-13 DIAGNOSIS — Z8679 Personal history of other diseases of the circulatory system: Secondary | ICD-10-CM

## 2015-07-13 DIAGNOSIS — N6459 Other signs and symptoms in breast: Secondary | ICD-10-CM

## 2015-07-13 DIAGNOSIS — I10 Essential (primary) hypertension: Secondary | ICD-10-CM

## 2015-07-13 DIAGNOSIS — E039 Hypothyroidism, unspecified: Secondary | ICD-10-CM | POA: Diagnosis not present

## 2015-07-13 DIAGNOSIS — R319 Hematuria, unspecified: Secondary | ICD-10-CM | POA: Diagnosis not present

## 2015-07-13 DIAGNOSIS — Z950 Presence of cardiac pacemaker: Secondary | ICD-10-CM

## 2015-07-13 DIAGNOSIS — R0989 Other specified symptoms and signs involving the circulatory and respiratory systems: Secondary | ICD-10-CM

## 2015-07-13 DIAGNOSIS — H3530 Unspecified macular degeneration: Secondary | ICD-10-CM

## 2015-07-13 DIAGNOSIS — Z1231 Encounter for screening mammogram for malignant neoplasm of breast: Secondary | ICD-10-CM

## 2015-07-13 DIAGNOSIS — H9193 Unspecified hearing loss, bilateral: Secondary | ICD-10-CM

## 2015-07-13 DIAGNOSIS — R6889 Other general symptoms and signs: Secondary | ICD-10-CM

## 2015-07-13 DIAGNOSIS — E785 Hyperlipidemia, unspecified: Secondary | ICD-10-CM

## 2015-07-13 LAB — POCT URINALYSIS DIPSTICK
BILIRUBIN UA: NEGATIVE
Glucose, UA: NEGATIVE
KETONES UA: NEGATIVE
LEUKOCYTES UA: NEGATIVE
Nitrite, UA: NEGATIVE
PH UA: 6
PROTEIN UA: NEGATIVE
Spec Grav, UA: 1.015
Urobilinogen, UA: 0.2

## 2015-07-15 LAB — CULTURE, URINE COMPREHENSIVE

## 2015-07-19 ENCOUNTER — Encounter: Payer: Self-pay | Admitting: Internal Medicine

## 2015-07-19 NOTE — Patient Instructions (Addendum)
It was a pleasure to see today. Continue same medications and return in 6 months. Have mammogram in the near future.
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
## 2015-07-22 ENCOUNTER — Other Ambulatory Visit: Payer: Self-pay | Admitting: Internal Medicine

## 2015-07-22 ENCOUNTER — Ambulatory Visit
Admission: RE | Admit: 2015-07-22 | Discharge: 2015-07-22 | Disposition: A | Discharge status: Home / Self Care | Payer: Medicare Other | Source: Ambulatory Visit | Attending: Internal Medicine | Admitting: Internal Medicine

## 2015-07-22 ENCOUNTER — Ambulatory Visit
Admission: RE | Admit: 2015-07-22 | Discharge: 2015-07-22 | Disposition: A | Discharge status: Home / Self Care | Payer: PRIVATE HEALTH INSURANCE | Source: Ambulatory Visit | Attending: Internal Medicine | Admitting: Internal Medicine

## 2015-07-22 DIAGNOSIS — N6459 Other signs and symptoms in breast: Secondary | ICD-10-CM

## 2015-07-22 DIAGNOSIS — N6320 Unspecified lump in the left breast, unspecified quadrant: Secondary | ICD-10-CM

## 2015-07-22 DIAGNOSIS — N63 Unspecified lump in breast: Secondary | ICD-10-CM | POA: Diagnosis not present

## 2015-07-22 DIAGNOSIS — N6489 Other specified disorders of breast: Secondary | ICD-10-CM | POA: Diagnosis not present

## 2015-07-23 ENCOUNTER — Other Ambulatory Visit: Payer: Self-pay | Admitting: Internal Medicine

## 2015-07-23 DIAGNOSIS — N6320 Unspecified lump in the left breast, unspecified quadrant: Secondary | ICD-10-CM

## 2015-07-27 ENCOUNTER — Ambulatory Visit
Admission: RE | Admit: 2015-07-27 | Discharge: 2015-07-27 | Disposition: A | Discharge status: Home / Self Care | Payer: Medicare Other | Source: Ambulatory Visit | Attending: Internal Medicine | Admitting: Internal Medicine

## 2015-07-27 DIAGNOSIS — N6320 Unspecified lump in the left breast, unspecified quadrant: Secondary | ICD-10-CM

## 2015-07-27 DIAGNOSIS — N63 Unspecified lump in breast: Secondary | ICD-10-CM | POA: Diagnosis not present

## 2015-07-27 DIAGNOSIS — C50812 Malignant neoplasm of overlapping sites of left female breast: Secondary | ICD-10-CM | POA: Diagnosis not present

## 2015-08-02 DIAGNOSIS — C50912 Malignant neoplasm of unspecified site of left female breast: Secondary | ICD-10-CM | POA: Diagnosis not present

## 2015-08-12 DIAGNOSIS — H353222 Exudative age-related macular degeneration, left eye, with inactive choroidal neovascularization: Secondary | ICD-10-CM | POA: Diagnosis not present

## 2015-08-12 DIAGNOSIS — H353124 Nonexudative age-related macular degeneration, left eye, advanced atrophic with subfoveal involvement: Secondary | ICD-10-CM | POA: Diagnosis not present

## 2015-08-12 DIAGNOSIS — H35363 Drusen (degenerative) of macula, bilateral: Secondary | ICD-10-CM | POA: Diagnosis not present

## 2015-08-12 DIAGNOSIS — H353111 Nonexudative age-related macular degeneration, right eye, early dry stage: Secondary | ICD-10-CM | POA: Diagnosis not present

## 2015-08-18 ENCOUNTER — Other Ambulatory Visit: Payer: Self-pay | Admitting: General Surgery

## 2015-08-18 DIAGNOSIS — C50912 Malignant neoplasm of unspecified site of left female breast: Secondary | ICD-10-CM

## 2015-08-19 ENCOUNTER — Other Ambulatory Visit: Payer: Self-pay | Admitting: General Surgery

## 2015-08-19 DIAGNOSIS — C50912 Malignant neoplasm of unspecified site of left female breast: Secondary | ICD-10-CM

## 2015-09-03 ENCOUNTER — Encounter (HOSPITAL_COMMUNITY): Payer: Self-pay

## 2015-09-03 ENCOUNTER — Encounter (HOSPITAL_COMMUNITY)
Admission: RE | Admit: 2015-09-03 | Discharge: 2015-09-03 | Disposition: A | Discharge status: Home / Self Care | Payer: Medicare Other | Source: Ambulatory Visit | Attending: General Surgery | Admitting: General Surgery

## 2015-09-03 DIAGNOSIS — C50912 Malignant neoplasm of unspecified site of left female breast: Secondary | ICD-10-CM | POA: Diagnosis not present

## 2015-09-03 DIAGNOSIS — Z01812 Encounter for preprocedural laboratory examination: Secondary | ICD-10-CM | POA: Diagnosis not present

## 2015-09-03 DIAGNOSIS — I10 Essential (primary) hypertension: Secondary | ICD-10-CM | POA: Insufficient documentation

## 2015-09-03 DIAGNOSIS — Z01818 Encounter for other preprocedural examination: Secondary | ICD-10-CM | POA: Insufficient documentation

## 2015-09-03 LAB — BASIC METABOLIC PANEL
ANION GAP: 9 (ref 5–15)
BUN: 31 mg/dL — ABNORMAL HIGH (ref 6–20)
CHLORIDE: 104 mmol/L (ref 101–111)
CO2: 26 mmol/L (ref 22–32)
Calcium: 9.4 mg/dL (ref 8.9–10.3)
Creatinine, Ser: 1.11 mg/dL — ABNORMAL HIGH (ref 0.44–1.00)
GFR, EST AFRICAN AMERICAN: 47 mL/min — AB (ref >60)
GFR, EST NON AFRICAN AMERICAN: 40 mL/min — AB (ref >60)
Glucose, Bld: 98 mg/dL (ref 65–99)
POTASSIUM: 4.2 mmol/L (ref 3.5–5.1)
SODIUM: 139 mmol/L (ref 135–145)

## 2015-09-03 LAB — CBC
HCT: 44.1 % (ref 36.0–46.0)
HEMOGLOBIN: 14.7 g/dL (ref 12.0–15.0)
MCH: 30.1 pg (ref 26.0–34.0)
MCHC: 33.3 g/dL (ref 30.0–36.0)
MCV: 90.4 fL (ref 78.0–100.0)
PLATELETS: 197 10*3/uL (ref 150–400)
RBC: 4.88 MIL/uL (ref 3.87–5.11)
RDW: 14 % (ref 11.5–15.5)
WBC: 9 10*3/uL (ref 4.0–10.5)

## 2015-09-03 MED ORDER — CHLORHEXIDINE GLUCONATE CLOTH 2 % EX PADS: 6.0000 | MEDICATED_PAD | Freq: Once | CUTANEOUS | Status: DC

## 2015-09-03 NOTE — Progress Notes (Signed)
Pacemaker form faxed to Dr Caryl Comes

## 2015-09-03 NOTE — Pre-Procedure Instructions (Signed)
    [name] [name]  09/03/2015      [id number] [place], [place] - [address] [place] [address] Phone: [phone] Fax: [phone]  [place], [place] [address] Phone: [phone] Fax: [phone]    Your procedure is scheduled on 09/10/15.  Report to [facility] Admitting at 1145 A.M.  Call this number if you have problems the morning of surgery:  [phone]   Remember:  Do not eat food or drink liquids after midnight.  Take these medicines the morning of surgery with A SIP OF WATER --synthroid,metoprolo  Do not wear jewelry, make-up or nail polish.  Do not wear lotions, powders, or perfumes.  You may wear deoderant.  Do not shave 48 hours prior to surgery.  Men may shave face and neck.  Do not bring valuables to the hospital.  [facility] is not responsible for any belongings or valuables.  Contacts, dentures or bridgework may not be worn into surgery.  Leave your suitcase in the car.  After surgery it may be brought to your room.  For patients admitted to the hospital, discharge time will be determined by your treatment team.  Patients discharged the day of surgery will not be allowed to drive home.   Name and phone number of your driver:   Special instructions:  Do not take any aspirin,anti-inflammatories,vitamins,or herbal supplements 5-7 days prior to surgery.  Please read over the following fact sheets that you were given.

## 2015-09-07 ENCOUNTER — Other Ambulatory Visit (HOSPITAL_COMMUNITY): Payer: Medicare Other

## 2015-09-08 ENCOUNTER — Ambulatory Visit
Admission: RE | Admit: 2015-09-08 | Discharge: 2015-09-08 | Disposition: A | Discharge status: Home / Self Care | Payer: Medicare Other | Source: Ambulatory Visit | Attending: General Surgery | Admitting: General Surgery

## 2015-09-08 DIAGNOSIS — C50912 Malignant neoplasm of unspecified site of left female breast: Secondary | ICD-10-CM | POA: Diagnosis not present

## 2015-09-09 MED ORDER — ACETAMINOPHEN 500 MG PO TABS
1000.0000 mg | ORAL_TABLET | ORAL | Status: AC
  Administered 2015-09-10: 1000 mg via ORAL
  Filled 2015-09-09: qty 2

## 2015-09-09 MED ORDER — CEFAZOLIN SODIUM-DEXTROSE 2-4 GM/100ML-% IV SOLN
2.0000 g | INTRAVENOUS | Status: AC
  Administered 2015-09-10: 2 g via INTRAVENOUS
  Filled 2015-09-09: qty 100

## 2015-09-09 NOTE — H&P (Deleted)
History of Present Illness  The patient is a 82 year old female who presents with breast cancer. She is a post menopausal female referred by Dr. Marcelo Baldy for evaluation of recently diagnosed carcinoma of the left breast. She recently presented for a screening mamogram revealing a new area of heterogeneous calcifications in the left breast lower outer quadrant anterior depth near the nipple. Subsequent imaging included diagnostic mamogram showing a group of heterogeneous calcifications as above measuring 12 mm. A stereotactic guided breast biopsy was performed on August 04, 2015 with pathology revealing DCIS, high-grade. She is seen now in the office for initial treatment planning. She has experienced no breast symptoms, specifically nipple discharge, lump or pain or skin changes. She does not have a personal history of any previous breast problems. Denies any family history of breast cancer  Findings at that time were the following: Tumor size: 1.2 cm Tumor grade: High-grade Estrogen Receptor: Pending Progesterone Receptor: Pending     Past Surgical History  Oral Surgery  Diagnostic Studies History  Mammogram within last year Pap Smear 1-5 years ago  Allergies Pioglitazone HCl *ANTIDIABETICS*  Medication History Glimepiride (4MG  Tablet, Oral) Active. Levemir FlexTouch (100UNIT/ML Soln Pen-inj, Subcutaneous) Active. MetFORMIN HCl (500MG  Tablet, Oral) Active. Triamterene-HCTZ (37.5-25MG  Capsule, Oral) Active. OneTouch UltraSoft Lancets Active. OneTouch Verio (In Vitro) Active. Medications Reconciled Triamterene/HCTZ (37.5-25MG  Capsule, Oral) Active. Ferrous Sulfate (325 (65 Fe)MG Tablet DR, Oral) Active.  Social History  Caffeine use Coffee. No drug use  Family History Alcohol Abuse Brother. Arthritis Mother. Hypertension Daughter, Father.  Pregnancy / Birth History  Age at menarche 71 years. Contraceptive History Oral contraceptives. Maternal age  30-30 Para 2    Review of Systems Skin Not Present- Change in Wart/Mole, Dryness, Hives, Jaundice, New Lesions, Non-Healing Wounds, Rash and Ulcer. HEENT Present- Wears glasses/contact lenses. Not Present- Earache, Hearing Loss, Hoarseness, Nose Bleed, Oral Ulcers, Ringing in the Ears, Seasonal Allergies, Sinus Pain, Sore Throat, Visual Disturbances and Yellow Eyes. Respiratory Not Present- Bloody sputum, Chronic Cough, Difficulty Breathing, Snoring and Wheezing. Cardiovascular Not Present- Chest Pain, Difficulty Breathing Lying Down, Leg Cramps, Palpitations, Rapid Heart Rate, Shortness of Breath and Swelling of Extremities. Female Genitourinary Not Present- Frequency, Nocturia, Painful Urination, Pelvic Pain and Urgency. Musculoskeletal Not Present- Back Pain, Joint Pain, Joint Stiffness, Muscle Pain, Muscle Weakness and Swelling of Extremities. Neurological Not Present- Decreased Memory, Fainting, Headaches, Numbness, Seizures, Tingling, Tremor, Trouble walking and Weakness. Endocrine Not Present- Cold Intolerance, Excessive Hunger, Hair Changes, Heat Intolerance, Hot flashes and New Diabetes. Hematology Not Present- Easy Bruising, Excessive bleeding, Gland problems, HIV and Persistent Infections.  Vitals   Weight: 251 lb Height: 65in Body Surface Area: 2.18 m Body Mass Index: 41.77 kg/m  Temp.: 97.35F(Temporal)  Pulse: 80 (Regular)  BP: 128/78 (Sitting, Left Arm, Standard)       Physical Exam  The physical exam findings are as follows: Note:General: Alert, obese African-American female, in no distress Skin: Warm and dry without rash or infection. HEENT: No palpable masses or thyromegaly. Sclera nonicteric. Pupils equal round and reactive. Lymph nodes: No cervical, supraclavicular, nodes palpable. Breasts: No palpable masses in either breast with particular attention to the subareolar area on the left. No nipple inversion or crusting or discharge. No palpable  axillary adenopathy. Lungs: Breath sounds clear and equal. No wheezing or increased work of breathing. Cardiovascular: Regular rate and rhythm without murmer. No JVD or edema. Extremities: 1+ lower extremity edema with mild chronic venous stasis changes Neurologic: Alert and fully oriented. Gait normal. No  focal weakness. Psychiatric: Normal mood and affect. Thought content appropriate with normal judgement and insight    Assessment & Plan  DUCTAL CARCINOMA IN SITU (DCIS) OF LEFT BREAST (D05.12) Impression: 82 year old female with a new diagnosis of cancer of the left breast, lower outer quadrant. Clinical stage 0 ER, PR pending, I discussed with the patient and initial surgical treatment options. We discussed options of breast conservation with lumpectomy or total mastectomy. She would appear to be an excellent candidate for lumpectomy After discussion she has elected to proceed with radioactive seed localized left breast lumpectomy. We discussed the indications and nature of the procedure, and expected recovery, in detail. Surgical risks including anesthetic complications, cardiorespiratory complications, bleeding, infection, wound healing complications, and possible need for further surgery based on the final pathology was discussed and understood. Hormonal therapy and radiation therapy have been discussed. All questions were answered. She understands and agrees to proceed and we will go ahead with scheduling. Current Plans  Radioactive seed localized left breast lumpectomy Referred to Oncology, for evaluation and follow up (Oncology). Routine. Referred to Radiation Oncology, for evaluation and follow up (Radiation Oncology). Routine. Pt Education - [facility] Breast Biopsy HCI: discussed with patient and provided information.

## 2015-09-10 ENCOUNTER — Ambulatory Visit
Admission: RE | Admit: 2015-09-10 | Discharge: 2015-09-10 | Disposition: A | Discharge status: Home / Self Care | Payer: Medicare Other | Source: Ambulatory Visit | Attending: General Surgery | Admitting: General Surgery

## 2015-09-10 ENCOUNTER — Encounter (HOSPITAL_COMMUNITY)
Admission: RE | Disposition: A | Discharge status: Home / Self Care | Payer: Self-pay | Source: Ambulatory Visit | Attending: General Surgery

## 2015-09-10 ENCOUNTER — Ambulatory Visit (HOSPITAL_COMMUNITY): Payer: Medicare Other | Admitting: Certified Registered Nurse Anesthetist

## 2015-09-10 ENCOUNTER — Encounter (HOSPITAL_COMMUNITY): Payer: Self-pay | Admitting: *Deleted

## 2015-09-10 ENCOUNTER — Ambulatory Visit (HOSPITAL_COMMUNITY)
Admission: RE | Admit: 2015-09-10 | Discharge: 2015-09-10 | Disposition: A | Discharge status: Home / Self Care | Payer: Medicare Other | Source: Ambulatory Visit | Attending: General Surgery | Admitting: General Surgery

## 2015-09-10 DIAGNOSIS — Z950 Presence of cardiac pacemaker: Secondary | ICD-10-CM | POA: Insufficient documentation

## 2015-09-10 DIAGNOSIS — Z8249 Family history of ischemic heart disease and other diseases of the circulatory system: Secondary | ICD-10-CM | POA: Insufficient documentation

## 2015-09-10 DIAGNOSIS — Z87891 Personal history of nicotine dependence: Secondary | ICD-10-CM | POA: Diagnosis not present

## 2015-09-10 DIAGNOSIS — N6012 Diffuse cystic mastopathy of left breast: Secondary | ICD-10-CM | POA: Diagnosis not present

## 2015-09-10 DIAGNOSIS — Z170 Estrogen receptor positive status [ER+]: Secondary | ICD-10-CM | POA: Diagnosis not present

## 2015-09-10 DIAGNOSIS — C50912 Malignant neoplasm of unspecified site of left female breast: Secondary | ICD-10-CM | POA: Diagnosis not present

## 2015-09-10 DIAGNOSIS — E7800 Pure hypercholesterolemia, unspecified: Secondary | ICD-10-CM | POA: Diagnosis not present

## 2015-09-10 HISTORY — PX: BREAST LUMPECTOMY WITH RADIOACTIVE SEED LOCALIZATION: SHX6424

## 2015-09-10 SURGERY — BREAST LUMPECTOMY WITH RADIOACTIVE SEED LOCALIZATION
Anesthesia: General | Site: Breast | Laterality: Left

## 2015-09-10 MED ORDER — MIDAZOLAM HCL 2 MG/2ML IJ SOLN
INTRAMUSCULAR | Status: AC
  Filled 2015-09-10: qty 2

## 2015-09-10 MED ORDER — FENTANYL CITRATE (PF) 250 MCG/5ML IJ SOLN
INTRAMUSCULAR | Status: DC | PRN
  Administered 2015-09-10 (×2): 25 ug via INTRAVENOUS

## 2015-09-10 MED ORDER — BUPIVACAINE-EPINEPHRINE (PF) 0.25% -1:200000 IJ SOLN
INTRAMUSCULAR | Status: AC
  Filled 2015-09-10: qty 30

## 2015-09-10 MED ORDER — ARTIFICIAL TEARS OP OINT
TOPICAL_OINTMENT | OPHTHALMIC | Status: AC
  Filled 2015-09-10: qty 3.5

## 2015-09-10 MED ORDER — BUPIVACAINE-EPINEPHRINE 0.25% -1:200000 IJ SOLN
INTRAMUSCULAR | Status: DC | PRN
  Administered 2015-09-10: 30 mL

## 2015-09-10 MED ORDER — PHENYLEPHRINE 40 MCG/ML (10ML) SYRINGE FOR IV PUSH (FOR BLOOD PRESSURE SUPPORT)
PREFILLED_SYRINGE | INTRAVENOUS | Status: AC
  Filled 2015-09-10: qty 10

## 2015-09-10 MED ORDER — PROPOFOL 10 MG/ML IV BOLUS
INTRAVENOUS | Status: DC | PRN
  Administered 2015-09-10: 50 mg via INTRAVENOUS
  Administered 2015-09-10: 150 mg via INTRAVENOUS

## 2015-09-10 MED ORDER — FENTANYL CITRATE (PF) 100 MCG/2ML IJ SOLN: 25.0000 ug | INTRAMUSCULAR | Status: DC | PRN

## 2015-09-10 MED ORDER — HYDROCODONE-ACETAMINOPHEN 5-325 MG PO TABS: 1.0000 | ORAL_TABLET | ORAL | Status: DC | PRN

## 2015-09-10 MED ORDER — ONDANSETRON HCL 4 MG/2ML IJ SOLN
INTRAMUSCULAR | Status: DC | PRN
Start: 2015-09-10 — End: 2015-09-10
  Administered 2015-09-10: 4 mg via INTRAVENOUS

## 2015-09-10 MED ORDER — 0.9 % SODIUM CHLORIDE (POUR BTL) OPTIME
TOPICAL | Status: DC | PRN
  Administered 2015-09-10: 1000 mL

## 2015-09-10 MED ORDER — FENTANYL CITRATE (PF) 100 MCG/2ML IJ SOLN
INTRAMUSCULAR | Status: AC
  Filled 2015-09-10: qty 2

## 2015-09-10 MED ORDER — PHENYLEPHRINE HCL 10 MG/ML IJ SOLN
10.0000 mg | INTRAVENOUS | Status: DC | PRN
  Administered 2015-09-10: 40 ug/min via INTRAVENOUS

## 2015-09-10 MED ORDER — ONDANSETRON HCL 4 MG/2ML IJ SOLN
INTRAMUSCULAR | Status: AC
  Filled 2015-09-10: qty 2

## 2015-09-10 MED ORDER — LACTATED RINGERS IV SOLN
INTRAVENOUS | Status: DC
  Administered 2015-09-10: 12:00:00 via INTRAVENOUS

## 2015-09-10 MED ORDER — ONDANSETRON HCL 4 MG/2ML IJ SOLN: 4.0000 mg | Freq: Once | INTRAMUSCULAR | Status: DC | PRN

## 2015-09-10 MED ORDER — FENTANYL CITRATE (PF) 250 MCG/5ML IJ SOLN
INTRAMUSCULAR | Status: AC
  Filled 2015-09-10: qty 5

## 2015-09-10 MED ORDER — PHENYLEPHRINE HCL 10 MG/ML IJ SOLN
INTRAMUSCULAR | Status: DC | PRN
  Administered 2015-09-10 (×5): 80 ug via INTRAVENOUS

## 2015-09-10 SURGICAL SUPPLY — 42 items
APPLIER CLIP 9.375 MED OPEN (MISCELLANEOUS)
APR CLP MED 9.3 20 MLT OPN (MISCELLANEOUS)
BINDER BREAST LRG (GAUZE/BANDAGES/DRESSINGS) IMPLANT
BLADE SURG 15 STRL LF DISP TIS (BLADE) ×1 IMPLANT
BLADE SURG 15 STRL SS (BLADE) ×2
CHLORAPREP W/TINT 26ML (MISCELLANEOUS) ×2 IMPLANT
CLIP APPLIE 9.375 MED OPEN (MISCELLANEOUS) IMPLANT
CLIP TI WIDE RED SMALL 6 (CLIP) ×1 IMPLANT
CONT SPEC 4OZ CLIKSEAL STRL BL (MISCELLANEOUS) ×1 IMPLANT
COVER PROBE W GEL 5X96 (DRAPES) ×2 IMPLANT
COVER SURGICAL LIGHT HANDLE (MISCELLANEOUS) ×2 IMPLANT
DEVICE DUBIN SPECIMEN MAMMOGRA (MISCELLANEOUS) ×2 IMPLANT
DRAPE CHEST BREAST 15X10 FENES (DRAPES) ×2 IMPLANT
DRAPE SURG 17X23 STRL (DRAPES) ×2 IMPLANT
DRAPE UTILITY XL STRL (DRAPES) ×2 IMPLANT
ELECT CAUTERY BLADE 6.4 (BLADE) ×2 IMPLANT
ELECT REM PT RETURN 9FT ADLT (ELECTROSURGICAL) ×2
ELECTRODE REM PT RTRN 9FT ADLT (ELECTROSURGICAL) ×1 IMPLANT
GLOVE BIOGEL PI IND STRL 8 (GLOVE) ×1 IMPLANT
GLOVE BIOGEL PI INDICATOR 8 (GLOVE) ×1
GLOVE ECLIPSE 7.5 STRL STRAW (GLOVE) ×2 IMPLANT
GOWN STRL REUS W/ TWL LRG LVL3 (GOWN DISPOSABLE) ×1 IMPLANT
GOWN STRL REUS W/ TWL XL LVL3 (GOWN DISPOSABLE) ×1 IMPLANT
GOWN STRL REUS W/TWL LRG LVL3 (GOWN DISPOSABLE) ×2
GOWN STRL REUS W/TWL XL LVL3 (GOWN DISPOSABLE) ×2
KIT BASIN OR (CUSTOM PROCEDURE TRAY) ×2 IMPLANT
KIT MARKER MARGIN INK (KITS) ×2 IMPLANT
LIQUID BAND (GAUZE/BANDAGES/DRESSINGS) ×1 IMPLANT
NDL HYPO 25X1 1.5 SAFETY (NEEDLE) ×1 IMPLANT
NEEDLE HYPO 25X1 1.5 SAFETY (NEEDLE) ×2 IMPLANT
NS IRRIG 1000ML POUR BTL (IV SOLUTION) ×1 IMPLANT
PACK SURGICAL SETUP 50X90 (CUSTOM PROCEDURE TRAY) ×2 IMPLANT
PENCIL BUTTON HOLSTER BLD 10FT (ELECTRODE) ×2 IMPLANT
SPONGE LAP 18X18 X RAY DECT (DISPOSABLE) ×2 IMPLANT
SUT MON AB 5-0 PS2 18 (SUTURE) ×2 IMPLANT
SUT VIC AB 3-0 SH 18 (SUTURE) ×2 IMPLANT
SYR BULB 3OZ (MISCELLANEOUS) ×2 IMPLANT
SYR CONTROL 10ML LL (SYRINGE) ×2 IMPLANT
TOWEL OR 17X24 6PK STRL BLUE (TOWEL DISPOSABLE) ×2 IMPLANT
TOWEL OR 17X26 10 PK STRL BLUE (TOWEL DISPOSABLE) ×2 IMPLANT
TUBE CONNECTING 12X1/4 (SUCTIONS) IMPLANT
YANKAUER SUCT BULB TIP NO VENT (SUCTIONS) IMPLANT

## 2015-09-10 NOTE — Transfer of Care (Signed)
Immediate Anesthesia Transfer of Care Note  Patient: [name] [name]  Procedure(s) Performed: Procedure(s): BREAST LUMPECTOMY WITH RADIOACTIVE SEED LOCALIZATION (Left)  Patient Location: PACU  Anesthesia Type:General  Level of Consciousness: awake, alert  and oriented  Airway & Oxygen Therapy: Patient Spontanous Breathing  Post-op Assessment: Report given to RN and Post -op Vital signs reviewed and stable  Post vital signs: Reviewed and stable  Last Vitals:  Filed Vitals:   09/10/15 1156  BP: 177/69  Pulse: 101  Temp: 36.7 C  Resp: 18    Last Pain: There were no vitals filed for this visit.       Complications: No apparent anesthesia complications

## 2015-09-10 NOTE — H&P (Signed)
History of Present Illness Patient words: Breast.  The patient is a 82 year old female who presents with breast cancer. She is referred by Dr. Emeline General and Curlene Dolphin due to a recent diagnosis of invasive cancer of the left breast. An abnormal breast exam was detected by Dr. Renold Genta on recent follow-up. She was referred to the breast center for further imaging and workup. She was felt to have some thickening in the right breast but mammogram showed normal right breast parenchyma but an irregular mass in the retroareolar left breast posteriorly at the 5 to 6:00 region. Targeted ultrasound was performed again normal on the right but in the deep retroareolar left breast at the 6:00 region was a hypoechoic irregular mass with indistinct margins fairly circumscribed measuring 1.1 x 0.9 cm. Ultrasound guided biopsy was discussed and accepted by the patient and this was performed on Jul 27, 2015. This has revealed invasive ductal carcinoma, grade 1-2, 100% ER and PR positive, HER-2 pending, Ki-67 15%. She has not noted any breast lumps or other changes even since knowing the diagnosis. Patient is in remarkably good shape for her age, lives in independent living at friend's home without current major medical problems. She does have a pacemaker. She has a remote benign right breast biopsy in the 1950s.   Other Problems  Bladder Problems High blood pressure Hypercholesterolemia Lump In Breast Melanoma Thyroid Disease  Past Surgical History Breast Biopsy Bilateral. Tonsillectomy  Diagnostic Studies History Colonoscopy 5-10 years ago Mammogram within last year Pap Smear >5 years ago  Allergies Codeine Sulfate *ANALGESICS - OPIOID* Vasotec *ANTIHYPERTENSIVES* Triamterene-HCTZ *DIURETICS*  Medication History Pravastatin Sodium (20MG Tablet, Oral) Active. Metoprolol Succinate ER (25MG Tablet ER 24HR, Oral) Active. Lisinopril (10MG Tablet, Oral)  Active. Levothyroxine Sodium (50MCG Tablet, Oral) Active. HydroCHLOROthiazide (12.5MG Capsule, Oral) Active. Aspirin (81MG Tablet DR, Oral) Active. Colace (100MG Capsule, Oral) Active. Mupirocin (2% Ointment, External) Active. Bentyl (10MG Capsule, Oral) Active. Medications Reconciled  Social History Alcohol use Moderate alcohol use. Caffeine use Coffee, Tea. Tobacco use Former smoker.  Family History  Cerebrovascular Accident Mother. Heart Disease Brother, Mother. Hypertension Brother, Father, Mother. Kidney Disease Father.  Pregnancy / Birth History  Age at menarche 14 years. Age of menopause 27-55 Gravida 4 Maternal age 44-30 Para 3    Review of Systems  General Not Present- Appetite Loss, Chills, Fatigue, Fever, Night Sweats, Weight Gain and Weight Loss. Skin Not Present- Change in Wart/Mole, Dryness, Hives, Jaundice, New Lesions, Non-Healing Wounds, Rash and Ulcer. HEENT Present- Hearing Loss, Visual Disturbances and Wears glasses/contact lenses. Not Present- Earache, Hoarseness, Nose Bleed, Oral Ulcers, Ringing in the Ears, Seasonal Allergies, Sinus Pain, Sore Throat and Yellow Eyes. Respiratory Not Present- Bloody sputum, Chronic Cough, Difficulty Breathing, Snoring and Wheezing. Breast Present- Breast Mass. Not Present- Breast Pain, Nipple Discharge and Skin Changes. Cardiovascular Not Present- Chest Pain, Difficulty Breathing Lying Down, Leg Cramps, Palpitations, Rapid Heart Rate, Shortness of Breath and Swelling of Extremities. Gastrointestinal Not Present- Abdominal Pain, Bloating, Bloody Stool, Change in Bowel Habits, Chronic diarrhea, Constipation, Difficulty Swallowing, Excessive gas, Gets full quickly at meals, Hemorrhoids, Indigestion, Nausea, Rectal Pain and Vomiting. Female Genitourinary Present- Frequency, Nocturia and Urgency. Not Present- Painful Urination and Pelvic Pain. Musculoskeletal Not Present- Back Pain, Joint Pain, Joint  Stiffness, Muscle Pain, Muscle Weakness and Swelling of Extremities. Neurological Present- Trouble walking. Not Present- Decreased Memory, Fainting, Headaches, Numbness, Seizures, Tingling, Tremor and Weakness. Endocrine Not Present- Cold Intolerance, Excessive Hunger, Hair Changes, Heat Intolerance, Hot flashes and  New Diabetes. Hematology Present- Easy Bruising. Not Present- Excessive bleeding, Gland problems, HIV and Persistent Infections.  BP 177/69 mmHg  Pulse 101  Temp(Src) 98.1 F (36.7 C) (Oral)  Resp 18  Ht _0  (1.6 m)  Wt 62.415 kg (137 lb 9.6 oz)  BMI 24.38 kg/m2  SpO2 96%       Physical Exam  The physical exam findings are as follows: Note:General: Alert elderly Caucasian female who appears much younger than her stated age, in no distress Skin: Warm and dry without rash or infection. Multiple benign keratoses HEENT: No palpable masses or thyromegaly. Sclera nonicteric. Pupils equal round and reactive. Lymph nodes: No cervical, supraclavicular, or axillary nodes palpable. Breasts: Some bruising and thickening centrally and retro-areolae in the left breast post biopsy. No other palpable abnormalities in either breast. Lungs: Breath sounds clear and equal. No wheezing or increased work of breathing. Cardiovascular: Regular rate and rhythm without murmer. No JVD or edema. Peripheral pulses intact. Abdomen: Nondistended. Soft and nontender. No masses palpable. No organomegaly. No palpable hernias. Extremities: No edema or joint swelling or deformity. No chronic venous stasis changes. Neurologic: Alert and fully oriented. No focal weakness. Psychiatric: Normal mood and affect. Thought content appropriate with normal judgement and insight    Assessment & Plan  STAGE I BREAST CANCER, LEFT (C50.912) Impression: New diagnosis of stage IA low-grade ER/PR positive invasive ductal carcinoma of the left breast, 1.1 cm on ultrasound and axilla clinically negative. Patient  appears in remarkably good health considering her age, independent and with normal mental faculties and no major ongoing health issues.  I discussed options with the patient and her friend today. I think at age 43 doing nothing is certainly a reasonable option although she is in remarkably good health and certainly could live at least several more years. We discussed standard treatment for breast cancer and I discussed potential scaled back treatment at her age which could include lumpectomy alone for hormonal therapy alone. She is reluctant to take another pill and risks side effects. Overall I think possibly just lumpectomy alone would be reasonable and effective. We discussed the procedure including use of radioactive seed localization and potential risks of bleeding and infection. Expected recovery was discussed and all her questions were answered. sshe has elected to proceed with lumpectomy as surgical therapy.   Current Plans Radioactive seed localized left breast lumpectomy

## 2015-09-10 NOTE — Progress Notes (Signed)
Report give to [facility] as caregiver

## 2015-09-10 NOTE — Anesthesia Procedure Notes (Signed)
Procedure Name: LMA Insertion Date/Time: 09/10/2015 2:01 PM Performed by: Merdis Delay Pre-anesthesia Checklist: Patient identified, Emergency Drugs available, Suction available, Patient being monitored and Timeout performed Patient Re-evaluated:Patient Re-evaluated prior to inductionOxygen Delivery Method: Circle system utilized Preoxygenation: Pre-oxygenation with 100% oxygen Intubation Type: IV induction Ventilation: Mask ventilation without difficulty LMA: LMA inserted LMA Size: 4.0 Number of attempts: 1 Placement Confirmation: positive ETCO2,  breath sounds checked- equal and bilateral and CO2 detector Tube secured with: Tape Dental Injury: Teeth and Oropharynx as per pre-operative assessment

## 2015-09-10 NOTE — Discharge Instructions (Signed)
[facility] °Office Phone Number [phone] ° °BREAST BIOPSY/ PARTIAL MASTECTOMY: POST OP INSTRUCTIONS ° °Always review your discharge instruction sheet given to you by the facility where your surgery was performed. ° °IF YOU HAVE DISABILITY OR FAMILY LEAVE FORMS, YOU MUST BRING THEM TO THE OFFICE FOR PROCESSING.  DO NOT GIVE THEM TO YOUR DOCTOR. ° °1. A prescription for pain medication may be given to you upon discharge.  Take your pain medication as prescribed, if needed.  If narcotic pain medicine is not needed, then you may take acetaminophen (Tylenol) or ibuprofen (Advil) as needed. °2. Take your usually prescribed medications unless otherwise directed °3. If you need a refill on your pain medication, please contact your pharmacy.  They will contact our office to request authorization.  Prescriptions will not be filled after 5pm or on week-ends. °4. You should eat very light the first 24 hours after surgery, such as soup, crackers, pudding, etc.  Resume your normal diet the day after surgery. °5. Most patients will experience some swelling and bruising in the breast.  Ice packs and a good support bra will help.  Swelling and bruising can take several days to resolve.  °6. It is common to experience some constipation if taking pain medication after surgery.  Increasing fluid intake and taking a stool softener will usually help or prevent this problem from occurring.  A mild laxative (Milk of Magnesia or Miralax) should be taken according to package directions if there are no bowel movements after 48 hours. °7. Unless discharge instructions indicate otherwise, you may remove your bandages 24-48 hours after surgery, and you may shower at that time.  You may have steri-strips (small skin tapes) in place directly over the incision.  These strips should be left on the skin for 7-10 days.  If your surgeon used skin glue on the incision, you may shower in 24 hours.  The glue will flake off over the  next 2-3 weeks.  Any sutures or staples will be removed at the office during your follow-up visit. °8. ACTIVITIES:  You may resume regular daily activities (gradually increasing) beginning the next day.  Wearing a good support bra or sports bra minimizes pain and swelling.  You may have sexual intercourse when it is comfortable. °a. You may drive when you no longer are taking prescription pain medication, you can comfortably wear a seatbelt, and you can safely maneuver your car and apply brakes. °b. RETURN TO WORK:  ______________________________________________________________________________________ °9. You should see your doctor in the office for a follow-up appointment approximately two weeks after your surgery.  Your doctor’s nurse will typically make your follow-up appointment when she calls you with your pathology report.  Expect your pathology report 2-3 business days after your surgery.  You may call to check if you do not hear from us after three days. °10. OTHER INSTRUCTIONS: _______________________________________________________________________________________________ _____________________________________________________________________________________________________________________________________ °_____________________________________________________________________________________________________________________________________ °_____________________________________________________________________________________________________________________________________ ° °WHEN TO CALL YOUR DOCTOR: °1. Fever over 101.0 °2. Nausea and/or vomiting. °3. Extreme swelling or bruising. °4. Continued bleeding from incision. °5. Increased pain, redness, or drainage from the incision. ° °The clinic staff is available to answer your questions during regular business hours.  Please don’t hesitate to call and ask to speak to one of the nurses for clinical concerns.  If you have a medical emergency, go to the nearest  emergency room or call 911.  A surgeon from [facility] is always on call at the hospital. ° °For further questions, please visit [email or web address]  °

## 2015-09-10 NOTE — Anesthesia Preprocedure Evaluation (Addendum)
Anesthesia Evaluation  Patient identified by MRN, date of birth, ID band Patient awake    Reviewed: Allergy & Precautions, H&P , NPO status , Patient's Chart, lab work & pertinent test results  History of Anesthesia Complications Negative for: history of anesthetic complications  Airway Mallampati: II  TM Distance: >3 FB Neck ROM: full    Dental no notable dental hx.    Pulmonary former smoker,    Pulmonary exam normal breath sounds clear to auscultation       Cardiovascular hypertension, Pt. 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   Neuro/Psych negative neurological ROS     GI/Hepatic negative GI ROS, Neg liver ROS,   Endo/Other  Hypothyroidism   Renal/GU negative Renal ROS     Musculoskeletal   Abdominal   Peds  Hematology negative hematology ROS (+)   Anesthesia Other Findings Denies syncope or signs of heart failure,, no hx of anesthesia issues  Reproductive/Obstetrics negative OB ROS                            Anesthesia Physical Anesthesia Plan  ASA: III  Anesthesia Plan: General   Post-op Pain Management:    Induction: Intravenous  Airway Management Planned: LMA  Additional Equipment:   Intra-op Plan:   Post-operative Plan: Extubation in OR  Informed Consent: I have reviewed the patients History and Physical, chart, labs and discussed the procedure including the risks, benefits and alternatives for the proposed anesthesia with the patient or authorized representative who has indicated his/her understanding and acceptance.   Dental Advisory Given  Plan Discussed with: Anesthesiologist, CRNA and Surgeon  Anesthesia Plan Comments:         Anesthesia Quick Evaluation

## 2015-09-10 NOTE — Progress Notes (Signed)
marcena rb here to interrgate pacemaker

## 2015-09-10 NOTE — Op Note (Signed)
Preoperative Diagnosis: LEFT BREAST CANCER  Postoprative Diagnosis: LEFT BREAST CANCER  Procedure: Procedure(s): BREAST LUMPECTOMY WITH RADIOACTIVE SEED LOCALIZATION   Surgeon: Excell Seltzer T   Assistants: none  Anesthesia:  General LMA anesthesia  Indications: patient is a 82 year old female in remarkably good health who was recently felt to have a breast lump on routinephysical exam.  He was referred for imaging which subsequentlyfound no abnormalities in the area in question but incidentally found a 1.4 cm suspicious mass in the posterior left breast which on ultrasound core biopsy has revealed invasive ductal carcinoma. After extensive discussion with the patient and her family regarding treatment options we have elected to proceed with radioactive seed left breast lumpectomy as definitive therapy. The procedure, indications, alternatives and risks have been discussed extensively and detailed elsewhere.    Procedure Detail:  patient had previously undergone accurate placement of a radioactive seed at the tumor and clip site in the posterior left breast.  Seed the placement was confirmed in the holding area.She was brought to the operating room, placed in the supine position on the operating table, and laryngeal mask general anesthesia induced.She received preoperative IV antibiotics. PAS were in place. The left breast was widely sterilely prepped and draped. Patient timeout was performed and correct procedure verified.  The neoprobe was used to confirm the placement of the seed and tumor. A circumareolar incision was used and dissection carried down through the subcutaneous tissue down to the breast capsule. Using the neoprobe for guidance a generous specimen of breast tissue was excised around the seed. This was taken down to the chest wall.The specimen was removed and inked. Specimen mammogram showed the marking clip and seed and tumor within the specimen although located relatively  close to the superior margin. I therefore took an additional almost 1 cm of superior margin which was oriented and sent as a separate specimen. The soft tissue was infiltrated with Marcaine. Complete hemostasis was assured. The lumpectomy cavity was marked with clips.The breast and subcutaneous tissue was closed with interrupted 3-0 Vicryl and the skin closed with subcuticular 5-0 Monocryl and Liquiban. Sponge needle and instrument counts were correct.    Findings: As above  Estimated Blood Loss:  Minimal         Drains: none  Blood Given: none          Specimens: left breast lumpectomy        Complications:  * No complications entered in OR log *         Disposition: PACU - hemodynamically stable.         Condition: stable

## 2015-09-10 NOTE — Interval H&P Note (Signed)
History and Physical Interval Note:  09/10/2015 1:48 PM  [name] [name]  has presented today for surgery, with the diagnosis of LEFT BREAST CANCER  The various methods of treatment have been discussed with the patient and family. After consideration of risks, benefits and other options for treatment, the patient has consented to  Procedure(s): BREAST LUMPECTOMY WITH RADIOACTIVE SEED LOCALIZATION (Left) as a surgical intervention .  The patient's history has been reviewed, patient examined, no change in status, stable for surgery.  I have reviewed the patient's chart and labs.  Questions were answered to the patient's satisfaction.     Jyl Chico T

## 2015-09-10 NOTE — Progress Notes (Signed)
Ppm clearance documentation not given via faxed...paperwork was faxed back stating that they could not provide clearance.  Spoke with anesthesiologist and the [employer] rep.  Pacemaker to be placed in asynchronous mode during surgery

## 2015-09-13 ENCOUNTER — Encounter (HOSPITAL_COMMUNITY): Payer: Self-pay | Admitting: General Surgery

## 2015-09-13 NOTE — Anesthesia Postprocedure Evaluation (Signed)
Anesthesia Post Note  Patient: [name] [name]  Procedure(s) Performed: Procedure(s) (LRB): BREAST LUMPECTOMY WITH RADIOACTIVE SEED LOCALIZATION (Left)  Patient location during evaluation: PACU Anesthesia Type: General Level of consciousness: awake and alert Pain management: pain level controlled Vital Signs Assessment: post-procedure vital signs reviewed and stable Respiratory status: spontaneous breathing, nonlabored ventilation, respiratory function stable and patient connected to nasal cannula oxygen Cardiovascular status: blood pressure returned to baseline and stable Postop Assessment: no signs of nausea or vomiting Anesthetic complications: no    Last Vitals:  Filed Vitals:   09/10/15 1545 09/10/15 1600  BP: 143/57 145/56  Pulse: 60 61  Temp:  36.4 C  Resp: 11 13    Last Pain:  Filed Vitals:   09/13/15 1447  PainSc: 0-No pain                 Zenaida Deed

## 2015-09-29 ENCOUNTER — Encounter: Payer: Self-pay | Admitting: Genetic Counselor

## 2015-10-07 DIAGNOSIS — I441 Atrioventricular block, second degree: Secondary | ICD-10-CM | POA: Diagnosis not present

## 2015-10-07 DIAGNOSIS — E039 Hypothyroidism, unspecified: Secondary | ICD-10-CM | POA: Diagnosis not present

## 2015-10-07 DIAGNOSIS — I480 Paroxysmal atrial fibrillation: Secondary | ICD-10-CM | POA: Diagnosis not present

## 2015-10-07 DIAGNOSIS — Z950 Presence of cardiac pacemaker: Secondary | ICD-10-CM | POA: Diagnosis not present

## 2015-10-07 DIAGNOSIS — I10 Essential (primary) hypertension: Secondary | ICD-10-CM | POA: Diagnosis not present

## 2015-10-07 DIAGNOSIS — E785 Hyperlipidemia, unspecified: Secondary | ICD-10-CM | POA: Diagnosis not present

## 2015-12-09 DIAGNOSIS — Z23 Encounter for immunization: Secondary | ICD-10-CM | POA: Diagnosis not present

## 2015-12-12 ENCOUNTER — Other Ambulatory Visit: Payer: Self-pay | Admitting: Internal Medicine

## 2015-12-27 ENCOUNTER — Other Ambulatory Visit: Payer: Self-pay | Admitting: Internal Medicine

## 2016-01-06 DIAGNOSIS — I441 Atrioventricular block, second degree: Secondary | ICD-10-CM | POA: Diagnosis not present

## 2016-01-06 DIAGNOSIS — E039 Hypothyroidism, unspecified: Secondary | ICD-10-CM | POA: Diagnosis not present

## 2016-01-06 DIAGNOSIS — E785 Hyperlipidemia, unspecified: Secondary | ICD-10-CM | POA: Diagnosis not present

## 2016-01-06 DIAGNOSIS — I480 Paroxysmal atrial fibrillation: Secondary | ICD-10-CM | POA: Diagnosis not present

## 2016-01-06 DIAGNOSIS — I10 Essential (primary) hypertension: Secondary | ICD-10-CM | POA: Diagnosis not present

## 2016-01-06 DIAGNOSIS — Z950 Presence of cardiac pacemaker: Secondary | ICD-10-CM | POA: Diagnosis not present

## 2016-01-11 DIAGNOSIS — E039 Hypothyroidism, unspecified: Secondary | ICD-10-CM | POA: Diagnosis not present

## 2016-01-11 DIAGNOSIS — E785 Hyperlipidemia, unspecified: Secondary | ICD-10-CM | POA: Diagnosis not present

## 2016-01-18 ENCOUNTER — Ambulatory Visit (INDEPENDENT_AMBULATORY_CARE_PROVIDER_SITE_OTHER): Payer: Medicare Other | Admitting: Internal Medicine

## 2016-01-18 ENCOUNTER — Encounter: Payer: Self-pay | Admitting: Internal Medicine

## 2016-01-18 VITALS — BP 130/80 | HR 65 | Ht 63.0 in | Wt 136.0 lb

## 2016-01-18 DIAGNOSIS — E039 Hypothyroidism, unspecified: Secondary | ICD-10-CM | POA: Diagnosis not present

## 2016-01-18 DIAGNOSIS — E7800 Pure hypercholesterolemia, unspecified: Secondary | ICD-10-CM

## 2016-01-18 DIAGNOSIS — I10 Essential (primary) hypertension: Secondary | ICD-10-CM

## 2016-01-18 DIAGNOSIS — H3530 Unspecified macular degeneration: Secondary | ICD-10-CM

## 2016-01-18 DIAGNOSIS — R0989 Other specified symptoms and signs involving the circulatory and respiratory systems: Secondary | ICD-10-CM

## 2016-01-18 DIAGNOSIS — Z950 Presence of cardiac pacemaker: Secondary | ICD-10-CM | POA: Diagnosis not present

## 2016-01-18 DIAGNOSIS — Z853 Personal history of malignant neoplasm of breast: Secondary | ICD-10-CM | POA: Diagnosis not present

## 2016-01-18 NOTE — Progress Notes (Signed)
   Subjective:    Patient ID: [name] [name], female    DOB: 23-Oct-1916, 82 y.o.   MRN: [id number]  HPI 82 year old Female resident of [place] still living independently there in an apartment with history of hypertension, hyperlipidemia, hypothyroidism, and pacemaker in today for six-month recheck. She has no new complaints or problems. History of asymptomatic right carotid bruit. Her mind is alert. She doesn't drive anymore. History of macular degeneration. Saw cardiologist recently for pacemaker check. She eats 2 meals a day in her apartment and one meal a day in the dining room. Ambulates with a walker.    Review of Systems as above     Objective:   Physical Exam  Skin warm and dry. Nodes none. Chest is clear to auscultation without rales or wheezing. Cardiac exam regular rate and rhythm normal S1 and S2. Extremities without edema.  She had lab work drawn recently at [facility] including normal TSH, lipid panel, liver functions.  Blood pressure stable on current regimen at 130/80  In July she had left breast lumpectomy for invasive ductal carcinoma. She also had radioactive seed implantation as definitive therapy. She has done well since that time.      Assessment & Plan:  Essential hypertension-stable  Hyperlipidemia-stable lipid panel liver functions on statin therapy  History of left breast cancer status post left lumpectomy with radioactive seed implanted July 2017  Hypothyroidism-recent lab work showed normal TSH on thyroid replacement  Plan: She'll return in 6 months for physical examination and will have lab work done through [facility] at that time

## 2016-01-18 NOTE — Patient Instructions (Signed)
It was pleasure to see you today. Please continue same medications and return in 6 months.

## 2016-02-15 ENCOUNTER — Encounter: Payer: Self-pay | Admitting: Internal Medicine

## 2016-02-15 DIAGNOSIS — E039 Hypothyroidism, unspecified: Secondary | ICD-10-CM | POA: Diagnosis not present

## 2016-02-15 DIAGNOSIS — Z950 Presence of cardiac pacemaker: Secondary | ICD-10-CM | POA: Diagnosis not present

## 2016-02-15 DIAGNOSIS — I10 Essential (primary) hypertension: Secondary | ICD-10-CM | POA: Diagnosis not present

## 2016-02-15 DIAGNOSIS — I480 Paroxysmal atrial fibrillation: Secondary | ICD-10-CM | POA: Diagnosis not present

## 2016-02-15 DIAGNOSIS — I441 Atrioventricular block, second degree: Secondary | ICD-10-CM | POA: Diagnosis not present

## 2016-02-15 DIAGNOSIS — E785 Hyperlipidemia, unspecified: Secondary | ICD-10-CM | POA: Diagnosis not present

## 2016-03-10 ENCOUNTER — Other Ambulatory Visit: Payer: Self-pay | Admitting: Internal Medicine

## 2016-04-06 DIAGNOSIS — I441 Atrioventricular block, second degree: Secondary | ICD-10-CM | POA: Diagnosis not present

## 2016-04-06 DIAGNOSIS — I480 Paroxysmal atrial fibrillation: Secondary | ICD-10-CM | POA: Diagnosis not present

## 2016-04-06 DIAGNOSIS — E039 Hypothyroidism, unspecified: Secondary | ICD-10-CM | POA: Diagnosis not present

## 2016-04-06 DIAGNOSIS — I10 Essential (primary) hypertension: Secondary | ICD-10-CM | POA: Diagnosis not present

## 2016-04-06 DIAGNOSIS — Z950 Presence of cardiac pacemaker: Secondary | ICD-10-CM | POA: Diagnosis not present

## 2016-04-06 DIAGNOSIS — E785 Hyperlipidemia, unspecified: Secondary | ICD-10-CM | POA: Diagnosis not present

## 2016-05-12 DIAGNOSIS — C50912 Malignant neoplasm of unspecified site of left female breast: Secondary | ICD-10-CM | POA: Diagnosis not present

## 2016-06-08 ENCOUNTER — Other Ambulatory Visit: Payer: Self-pay | Admitting: Internal Medicine

## 2016-06-08 NOTE — Telephone Encounter (Signed)
Refill x 6 months 

## 2016-06-20 DIAGNOSIS — H353111 Nonexudative age-related macular degeneration, right eye, early dry stage: Secondary | ICD-10-CM | POA: Diagnosis not present

## 2016-06-20 DIAGNOSIS — H40013 Open angle with borderline findings, low risk, bilateral: Secondary | ICD-10-CM | POA: Diagnosis not present

## 2016-06-20 DIAGNOSIS — Z961 Presence of intraocular lens: Secondary | ICD-10-CM | POA: Diagnosis not present

## 2016-06-20 DIAGNOSIS — H353124 Nonexudative age-related macular degeneration, left eye, advanced atrophic with subfoveal involvement: Secondary | ICD-10-CM | POA: Diagnosis not present

## 2016-06-30 ENCOUNTER — Encounter: Payer: Self-pay | Admitting: Internal Medicine

## 2016-06-30 ENCOUNTER — Ambulatory Visit (INDEPENDENT_AMBULATORY_CARE_PROVIDER_SITE_OTHER): Payer: Medicare Other | Admitting: Internal Medicine

## 2016-06-30 VITALS — BP 152/82 | HR 67 | Temp 97.5°F | Ht 63.0 in | Wt 140.0 lb

## 2016-06-30 DIAGNOSIS — L03116 Cellulitis of left lower limb: Secondary | ICD-10-CM | POA: Diagnosis not present

## 2016-06-30 DIAGNOSIS — R600 Localized edema: Secondary | ICD-10-CM | POA: Diagnosis not present

## 2016-06-30 LAB — CBC WITH DIFFERENTIAL/PLATELET
BASOS PCT: 0 %
Basophils Absolute: 0 cells/uL (ref 0–200)
EOS PCT: 3 %
Eosinophils Absolute: 234 cells/uL (ref 15–500)
HCT: 41.3 % (ref 35.0–45.0)
HEMOGLOBIN: 13.4 g/dL (ref 11.7–15.5)
Lymphocytes Relative: 32 %
Lymphs Abs: 2496 cells/uL (ref 850–3900)
MCH: 29.3 pg (ref 27.0–33.0)
MCHC: 32.4 g/dL (ref 32.0–36.0)
MCV: 90.2 fL (ref 80.0–100.0)
MONO ABS: 858 {cells}/uL (ref 200–950)
MONOS PCT: 11 %
MPV: 11.6 fL (ref 7.5–12.5)
Neutro Abs: 4212 cells/uL (ref 1500–7800)
Neutrophils Relative %: 54 %
Platelets: 218 10*3/uL (ref 140–400)
RBC: 4.58 MIL/uL (ref 3.80–5.10)
RDW: 14 % (ref 11.0–15.0)
WBC: 7.8 10*3/uL (ref 3.8–10.8)

## 2016-06-30 NOTE — Progress Notes (Signed)
   Subjective:    Patient ID: [name] [name], female    DOB: [birth year], 83 y.o.   MRN: [id number]  HPI 83 year old white female resides at friend's home and is ambulatory with a walker. Doesn't recall any fall or injury but has redness and increased warmth left lateral ankle with some swelling. Also has bluish discoloration lateral left foot. Good range of motion with ankle. Tender around area that is erythematous.    Review of Systems     Objective:   Physical Exam  Left lateral ankle above the lateral malleolus she has an area of approximately 3 cm in diameter that is warm and red. There may be a very superficial abrasion at 12:00. There is bluish discoloration left lateral ankle consistent with what appears to be blood from another area of the foot or ankle that is collecting due to gravity.  She is able to ambulate with her walker without significant difficulty. She is afebrile.  There is edema of the left lateral foot      Assessment & Plan:  Cellulitis left lateral ankle  ? Ankle sprain with ligament injury the patient does not recall any injury  Plan: Doxycycline 100 mg twice daily for 7 days. Keep ankle elevated. Wrap today with Ace wrap. Call if not better by Monday, May 7. CBC with differential drawn

## 2016-06-30 NOTE — Addendum Note (Signed)
Addended by: Inocencio Homes on: 06/30/2016 10:22 AM   Modules accepted: Orders

## 2016-06-30 NOTE — Patient Instructions (Signed)
Keep foot elevated as much as possible. Doxycycline 100 mg twice daily for 7 days. Ace wrap for support and swelling. Call if not better by Monday, May 7. CBC with differential drawn

## 2016-07-03 ENCOUNTER — Telehealth: Payer: Self-pay

## 2016-07-03 DIAGNOSIS — M7989 Other specified soft tissue disorders: Secondary | ICD-10-CM

## 2016-07-03 DIAGNOSIS — M25472 Effusion, left ankle: Secondary | ICD-10-CM

## 2016-07-03 NOTE — Telephone Encounter (Signed)
Less red, less swollen still on antibiotics. Pt wants to know nothing is broken ordered xray.

## 2016-07-04 ENCOUNTER — Ambulatory Visit
Admission: RE | Admit: 2016-07-04 | Discharge: 2016-07-04 | Disposition: A | Discharge status: Home / Self Care | Payer: Medicare Other | Source: Ambulatory Visit | Attending: Internal Medicine | Admitting: Internal Medicine

## 2016-07-04 DIAGNOSIS — M19072 Primary osteoarthritis, left ankle and foot: Secondary | ICD-10-CM | POA: Diagnosis not present

## 2016-07-04 DIAGNOSIS — M79672 Pain in left foot: Secondary | ICD-10-CM | POA: Diagnosis not present

## 2016-07-04 DIAGNOSIS — M7989 Other specified soft tissue disorders: Secondary | ICD-10-CM | POA: Diagnosis not present

## 2016-07-06 ENCOUNTER — Encounter: Payer: Self-pay | Admitting: Internal Medicine

## 2016-07-06 ENCOUNTER — Telehealth: Payer: Self-pay | Admitting: Internal Medicine

## 2016-07-06 MED ORDER — DOXYCYCLINE HYCLATE 100 MG PO TABS: 100.0000 mg | ORAL_TABLET | Freq: Two times a day (BID) | ORAL | 0 refills | Status: DC

## 2016-07-06 NOTE — Telephone Encounter (Signed)
ERROR

## 2016-07-06 NOTE — Telephone Encounter (Signed)
Patient called saying that her leg was less swollen but still red and somewhat hot. Nurse thought she needed to be seen. Offered to see patient before 3 PM. Patient not sure she can obtain a ride today. She is about to finish course of doxycycline for 7 days that was prescribed on May 4. We're going to renew doxycycline and she will continue treating her leg. It truly looked like stasis dermatitis/cellulitis when I saw her. If she is not getting better next week, we will have to consider further evaluation with specialist. X-rays were negative. She doesn't recall any injury.

## 2016-07-07 DIAGNOSIS — I441 Atrioventricular block, second degree: Secondary | ICD-10-CM | POA: Diagnosis not present

## 2016-07-07 DIAGNOSIS — Z950 Presence of cardiac pacemaker: Secondary | ICD-10-CM | POA: Diagnosis not present

## 2016-07-07 DIAGNOSIS — E785 Hyperlipidemia, unspecified: Secondary | ICD-10-CM | POA: Diagnosis not present

## 2016-07-07 DIAGNOSIS — I10 Essential (primary) hypertension: Secondary | ICD-10-CM | POA: Diagnosis not present

## 2016-07-07 DIAGNOSIS — E039 Hypothyroidism, unspecified: Secondary | ICD-10-CM | POA: Diagnosis not present

## 2016-07-07 DIAGNOSIS — I480 Paroxysmal atrial fibrillation: Secondary | ICD-10-CM | POA: Diagnosis not present

## 2016-07-11 DIAGNOSIS — I10 Essential (primary) hypertension: Secondary | ICD-10-CM | POA: Diagnosis not present

## 2016-07-11 DIAGNOSIS — E038 Other specified hypothyroidism: Secondary | ICD-10-CM | POA: Diagnosis not present

## 2016-07-11 DIAGNOSIS — Z950 Presence of cardiac pacemaker: Secondary | ICD-10-CM | POA: Diagnosis not present

## 2016-07-17 ENCOUNTER — Encounter: Payer: Self-pay | Admitting: Internal Medicine

## 2016-07-17 ENCOUNTER — Ambulatory Visit (INDEPENDENT_AMBULATORY_CARE_PROVIDER_SITE_OTHER): Payer: Medicare Other | Admitting: Internal Medicine

## 2016-07-17 VITALS — BP 148/70 | HR 67 | Temp 95.9°F | Ht 61.5 in | Wt 132.0 lb

## 2016-07-17 DIAGNOSIS — Z950 Presence of cardiac pacemaker: Secondary | ICD-10-CM | POA: Diagnosis not present

## 2016-07-17 DIAGNOSIS — I10 Essential (primary) hypertension: Secondary | ICD-10-CM | POA: Diagnosis not present

## 2016-07-17 DIAGNOSIS — E7849 Other hyperlipidemia: Secondary | ICD-10-CM

## 2016-07-17 DIAGNOSIS — E784 Other hyperlipidemia: Secondary | ICD-10-CM | POA: Diagnosis not present

## 2016-07-17 DIAGNOSIS — Z0000 Encounter for general adult medical examination without abnormal findings: Secondary | ICD-10-CM | POA: Diagnosis not present

## 2016-07-17 DIAGNOSIS — E039 Hypothyroidism, unspecified: Secondary | ICD-10-CM

## 2016-07-17 DIAGNOSIS — L03116 Cellulitis of left lower limb: Secondary | ICD-10-CM

## 2016-07-17 DIAGNOSIS — H9193 Unspecified hearing loss, bilateral: Secondary | ICD-10-CM

## 2016-07-17 DIAGNOSIS — Z853 Personal history of malignant neoplasm of breast: Secondary | ICD-10-CM | POA: Diagnosis not present

## 2016-07-17 LAB — POCT URINALYSIS DIPSTICK
Bilirubin, UA: NEGATIVE
Blood, UA: NEGATIVE
GLUCOSE UA: NEGATIVE
Ketones, UA: NEGATIVE
LEUKOCYTES UA: NEGATIVE
Nitrite, UA: NEGATIVE
PROTEIN UA: NEGATIVE
SPEC GRAV UA: 1.01 (ref 1.010–1.025)
UROBILINOGEN UA: 0.2 U/dL
pH, UA: 6 (ref 5.0–8.0)

## 2016-07-17 NOTE — Progress Notes (Signed)
   Subjective:    Patient ID: [name] [name], female    DOB: [birth year], 83 y.o.   MRN: [id number]  HPI    Review of Systems     Objective:   Physical Exam        Assessment & Plan:
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
## 2016-07-18 NOTE — Patient Instructions (Signed)
It was a pleasure to see today. Continue same medications and return in 6 months. See Dr. Excell Seltzer for checkup. Have mammogram.

## 2016-08-10 DIAGNOSIS — H353124 Nonexudative age-related macular degeneration, left eye, advanced atrophic with subfoveal involvement: Secondary | ICD-10-CM | POA: Diagnosis not present

## 2016-08-10 DIAGNOSIS — H353111 Nonexudative age-related macular degeneration, right eye, early dry stage: Secondary | ICD-10-CM | POA: Diagnosis not present

## 2016-08-10 DIAGNOSIS — H35363 Drusen (degenerative) of macula, bilateral: Secondary | ICD-10-CM | POA: Diagnosis not present

## 2016-08-10 DIAGNOSIS — H353222 Exudative age-related macular degeneration, left eye, with inactive choroidal neovascularization: Secondary | ICD-10-CM | POA: Diagnosis not present

## 2016-10-10 ENCOUNTER — Ambulatory Visit
Admission: RE | Admit: 2016-10-10 | Discharge: 2016-10-10 | Disposition: A | Discharge status: Home / Self Care | Payer: Medicare Other | Source: Ambulatory Visit | Attending: Internal Medicine | Admitting: Internal Medicine

## 2016-10-10 DIAGNOSIS — Z853 Personal history of malignant neoplasm of breast: Secondary | ICD-10-CM

## 2016-10-10 DIAGNOSIS — R928 Other abnormal and inconclusive findings on diagnostic imaging of breast: Secondary | ICD-10-CM | POA: Diagnosis not present

## 2016-10-10 HISTORY — DX: Malignant neoplasm of unspecified site of unspecified female breast: C50.919

## 2016-10-12 DIAGNOSIS — E039 Hypothyroidism, unspecified: Secondary | ICD-10-CM | POA: Diagnosis not present

## 2016-10-12 DIAGNOSIS — Z950 Presence of cardiac pacemaker: Secondary | ICD-10-CM | POA: Diagnosis not present

## 2016-10-12 DIAGNOSIS — I441 Atrioventricular block, second degree: Secondary | ICD-10-CM | POA: Diagnosis not present

## 2016-10-12 DIAGNOSIS — I480 Paroxysmal atrial fibrillation: Secondary | ICD-10-CM | POA: Diagnosis not present

## 2016-10-12 DIAGNOSIS — I10 Essential (primary) hypertension: Secondary | ICD-10-CM | POA: Diagnosis not present

## 2016-10-12 DIAGNOSIS — E785 Hyperlipidemia, unspecified: Secondary | ICD-10-CM | POA: Diagnosis not present

## 2016-12-06 ENCOUNTER — Other Ambulatory Visit: Payer: Self-pay | Admitting: Internal Medicine

## 2016-12-06 DIAGNOSIS — Z23 Encounter for immunization: Secondary | ICD-10-CM | POA: Diagnosis not present

## 2016-12-25 ENCOUNTER — Other Ambulatory Visit: Payer: Self-pay | Admitting: Internal Medicine

## 2017-01-02 DIAGNOSIS — I10 Essential (primary) hypertension: Secondary | ICD-10-CM | POA: Diagnosis not present

## 2017-01-02 DIAGNOSIS — Z950 Presence of cardiac pacemaker: Secondary | ICD-10-CM | POA: Diagnosis not present

## 2017-01-02 DIAGNOSIS — E038 Other specified hypothyroidism: Secondary | ICD-10-CM | POA: Diagnosis not present

## 2017-01-08 ENCOUNTER — Ambulatory Visit: Payer: Medicare Other | Admitting: Internal Medicine

## 2017-01-11 ENCOUNTER — Encounter: Payer: Self-pay | Admitting: Internal Medicine

## 2017-01-11 ENCOUNTER — Ambulatory Visit (INDEPENDENT_AMBULATORY_CARE_PROVIDER_SITE_OTHER): Payer: Medicare Other | Admitting: Internal Medicine

## 2017-01-11 VITALS — BP 140/80 | HR 71 | Temp 97.8°F | Wt 142.0 lb

## 2017-01-11 DIAGNOSIS — H9193 Unspecified hearing loss, bilateral: Secondary | ICD-10-CM | POA: Diagnosis not present

## 2017-01-11 DIAGNOSIS — E039 Hypothyroidism, unspecified: Secondary | ICD-10-CM | POA: Diagnosis not present

## 2017-01-11 DIAGNOSIS — Z950 Presence of cardiac pacemaker: Secondary | ICD-10-CM | POA: Diagnosis not present

## 2017-01-11 DIAGNOSIS — I10 Essential (primary) hypertension: Secondary | ICD-10-CM

## 2017-01-11 DIAGNOSIS — R600 Localized edema: Secondary | ICD-10-CM | POA: Diagnosis not present

## 2017-01-11 DIAGNOSIS — E7800 Pure hypercholesterolemia, unspecified: Secondary | ICD-10-CM

## 2017-01-11 DIAGNOSIS — Z853 Personal history of malignant neoplasm of breast: Secondary | ICD-10-CM | POA: Diagnosis not present

## 2017-01-12 LAB — BRAIN NATRIURETIC PEPTIDE: Brain Natriuretic Peptide: 42 pg/mL (ref <100)

## 2017-01-20 DIAGNOSIS — I480 Paroxysmal atrial fibrillation: Secondary | ICD-10-CM | POA: Diagnosis not present

## 2017-01-20 DIAGNOSIS — E785 Hyperlipidemia, unspecified: Secondary | ICD-10-CM | POA: Diagnosis not present

## 2017-01-20 DIAGNOSIS — I10 Essential (primary) hypertension: Secondary | ICD-10-CM | POA: Diagnosis not present

## 2017-01-20 DIAGNOSIS — E039 Hypothyroidism, unspecified: Secondary | ICD-10-CM | POA: Diagnosis not present

## 2017-01-20 DIAGNOSIS — I441 Atrioventricular block, second degree: Secondary | ICD-10-CM | POA: Diagnosis not present

## 2017-01-20 DIAGNOSIS — Z950 Presence of cardiac pacemaker: Secondary | ICD-10-CM | POA: Diagnosis not present

## 2017-01-21 NOTE — Progress Notes (Signed)
   Subjective:    Patient ID: [name] [name], female    DOB: 11-22-1916, 83 y.o.   MRN: [id number]  HPI Amazing 83 year old Female who resides at  [facility] in today for 65-month recheck.  She has a pacemaker inserted in 2006 for Mobitz II  rhythm.  She has history of hypertension hypothyroidism and hyperlipidemia.  She is complaining of some shortness of breath.  BNP was checked today and is completely normal.  Recently had flu vaccine through [facility].  See documentation in Epic under media  She has a history of breast cancer diagnosed in 2017 and is doing well status post surgery.  She did not want radiation therapy.  Recent lab work done through [facility] is entirely within normal limits.  TSH, lipid panel and liver functions are normal.    Review of Systems no new complaints other than gets tired easily and some mild lower extremity edema.  She has been diagnosed with macular degeneration.     Objective:   Physical Exam Skin warm and dry.  Nodes none.  Neck is supple without JVD thyromegaly.  Right carotid bruit.  Chest clear to auscultation.  Cardiac exam regular rate and rhythm.  Extremities without significant edema.  She is alert and oriented.  History of pacemaker  History of breast cancer  Hypothyroidism-stable with thyroid replacement  Essential hypertension-stable on current regimen  Hyperlipidemia- controlled with statin medication  Fatigue and shortness of breath but BNP is normal  Plan: I am pleased with how she is doing at the present time.  Return in 6 months for physical exam.      Assessment & Plan:

## 2017-01-21 NOTE — Patient Instructions (Addendum)
Leg swelling is related to dependent edema.  Try to keep feet elevated as much as possible.  No evidence of heart failure.  BNP is normal.  Continue same medications and return in 6 months for physical exam.  It was a pleasure to see you today.

## 2017-02-15 DIAGNOSIS — C50912 Malignant neoplasm of unspecified site of left female breast: Secondary | ICD-10-CM | POA: Diagnosis not present

## 2017-02-16 DIAGNOSIS — Z950 Presence of cardiac pacemaker: Secondary | ICD-10-CM | POA: Diagnosis not present

## 2017-02-16 DIAGNOSIS — I480 Paroxysmal atrial fibrillation: Secondary | ICD-10-CM | POA: Diagnosis not present

## 2017-02-16 DIAGNOSIS — E7849 Other hyperlipidemia: Secondary | ICD-10-CM | POA: Diagnosis not present

## 2017-02-16 DIAGNOSIS — I10 Essential (primary) hypertension: Secondary | ICD-10-CM | POA: Diagnosis not present

## 2017-02-16 DIAGNOSIS — E039 Hypothyroidism, unspecified: Secondary | ICD-10-CM | POA: Diagnosis not present

## 2017-02-16 DIAGNOSIS — I441 Atrioventricular block, second degree: Secondary | ICD-10-CM | POA: Diagnosis not present

## 2017-03-04 ENCOUNTER — Other Ambulatory Visit: Payer: Self-pay | Admitting: Internal Medicine

## 2017-03-26 ENCOUNTER — Other Ambulatory Visit: Payer: Self-pay | Admitting: Internal Medicine

## 2017-05-11 ENCOUNTER — Other Ambulatory Visit: Payer: Self-pay | Admitting: Internal Medicine

## 2017-05-30 ENCOUNTER — Other Ambulatory Visit: Payer: Self-pay | Admitting: Internal Medicine

## 2017-06-01 DIAGNOSIS — I480 Paroxysmal atrial fibrillation: Secondary | ICD-10-CM | POA: Diagnosis not present

## 2017-06-01 DIAGNOSIS — I441 Atrioventricular block, second degree: Secondary | ICD-10-CM | POA: Diagnosis not present

## 2017-06-01 DIAGNOSIS — Z950 Presence of cardiac pacemaker: Secondary | ICD-10-CM | POA: Diagnosis not present

## 2017-06-01 DIAGNOSIS — E7849 Other hyperlipidemia: Secondary | ICD-10-CM | POA: Diagnosis not present

## 2017-06-01 DIAGNOSIS — I10 Essential (primary) hypertension: Secondary | ICD-10-CM | POA: Diagnosis not present

## 2017-06-01 DIAGNOSIS — E039 Hypothyroidism, unspecified: Secondary | ICD-10-CM | POA: Diagnosis not present

## 2017-06-20 DIAGNOSIS — Z961 Presence of intraocular lens: Secondary | ICD-10-CM | POA: Diagnosis not present

## 2017-06-20 DIAGNOSIS — H40013 Open angle with borderline findings, low risk, bilateral: Secondary | ICD-10-CM | POA: Diagnosis not present

## 2017-06-20 DIAGNOSIS — H353111 Nonexudative age-related macular degeneration, right eye, early dry stage: Secondary | ICD-10-CM | POA: Diagnosis not present

## 2017-06-20 DIAGNOSIS — H353124 Nonexudative age-related macular degeneration, left eye, advanced atrophic with subfoveal involvement: Secondary | ICD-10-CM | POA: Diagnosis not present

## 2017-06-21 ENCOUNTER — Other Ambulatory Visit: Payer: Self-pay | Admitting: Internal Medicine

## 2017-07-09 ENCOUNTER — Telehealth: Payer: Self-pay

## 2017-07-09 NOTE — Telephone Encounter (Signed)
Orders written. Please make copy of orders and fax

## 2017-07-09 NOTE — Telephone Encounter (Signed)
Patient called is needing orders sent to the nurse for CPE labs faxed to [phone] Thank you.

## 2017-07-10 NOTE — Telephone Encounter (Signed)
Copied and faxed.

## 2017-07-17 ENCOUNTER — Encounter: Payer: Self-pay | Admitting: Internal Medicine

## 2017-07-17 DIAGNOSIS — E039 Hypothyroidism, unspecified: Secondary | ICD-10-CM | POA: Diagnosis not present

## 2017-07-17 DIAGNOSIS — Z853 Personal history of malignant neoplasm of breast: Secondary | ICD-10-CM | POA: Diagnosis not present

## 2017-07-17 DIAGNOSIS — Z950 Presence of cardiac pacemaker: Secondary | ICD-10-CM | POA: Diagnosis not present

## 2017-07-17 DIAGNOSIS — I10 Essential (primary) hypertension: Secondary | ICD-10-CM | POA: Diagnosis not present

## 2017-07-17 DIAGNOSIS — Z0000 Encounter for general adult medical examination without abnormal findings: Secondary | ICD-10-CM | POA: Diagnosis not present

## 2017-07-19 ENCOUNTER — Encounter: Payer: Self-pay | Admitting: Internal Medicine

## 2017-07-19 ENCOUNTER — Ambulatory Visit (INDEPENDENT_AMBULATORY_CARE_PROVIDER_SITE_OTHER): Payer: Medicare Other | Admitting: Internal Medicine

## 2017-07-19 VITALS — BP 130/80 | HR 80 | Ht 63.5 in | Wt 142.0 lb

## 2017-07-19 DIAGNOSIS — I10 Essential (primary) hypertension: Secondary | ICD-10-CM

## 2017-07-19 DIAGNOSIS — H9193 Unspecified hearing loss, bilateral: Secondary | ICD-10-CM

## 2017-07-19 DIAGNOSIS — E039 Hypothyroidism, unspecified: Secondary | ICD-10-CM | POA: Diagnosis not present

## 2017-07-19 DIAGNOSIS — Z0000 Encounter for general adult medical examination without abnormal findings: Secondary | ICD-10-CM

## 2017-07-19 DIAGNOSIS — Z950 Presence of cardiac pacemaker: Secondary | ICD-10-CM

## 2017-07-19 DIAGNOSIS — R0989 Other specified symptoms and signs involving the circulatory and respiratory systems: Secondary | ICD-10-CM

## 2017-07-19 DIAGNOSIS — H3530 Unspecified macular degeneration: Secondary | ICD-10-CM | POA: Diagnosis not present

## 2017-07-19 DIAGNOSIS — R8290 Unspecified abnormal findings in urine: Secondary | ICD-10-CM

## 2017-07-19 DIAGNOSIS — E7800 Pure hypercholesterolemia, unspecified: Secondary | ICD-10-CM | POA: Diagnosis not present

## 2017-07-19 DIAGNOSIS — Z853 Personal history of malignant neoplasm of breast: Secondary | ICD-10-CM | POA: Diagnosis not present

## 2017-07-19 LAB — POCT URINALYSIS DIPSTICK
Bilirubin, UA: NEGATIVE
Glucose, UA: NEGATIVE
Ketones, UA: NEGATIVE
NITRITE UA: NEGATIVE
PH UA: 6 (ref 5.0–8.0)
PROTEIN UA: NEGATIVE
SPEC GRAV UA: 1.015 (ref 1.010–1.025)
UROBILINOGEN UA: 0.2 U/dL

## 2017-07-20 LAB — URINE CULTURE
MICRO NUMBER:: 90629578
Result:: NO GROWTH
SPECIMEN QUALITY: ADEQUATE

## 2017-07-27 ENCOUNTER — Encounter: Payer: Self-pay | Admitting: Internal Medicine

## 2017-07-27 NOTE — Patient Instructions (Addendum)
It was delightful to see you today.  Lab work is stable for your age.  Return in 6 months.  No change in medications
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
## 2017-08-16 DIAGNOSIS — H35363 Drusen (degenerative) of macula, bilateral: Secondary | ICD-10-CM | POA: Diagnosis not present

## 2017-08-16 DIAGNOSIS — H353222 Exudative age-related macular degeneration, left eye, with inactive choroidal neovascularization: Secondary | ICD-10-CM | POA: Diagnosis not present

## 2017-08-16 DIAGNOSIS — H353124 Nonexudative age-related macular degeneration, left eye, advanced atrophic with subfoveal involvement: Secondary | ICD-10-CM | POA: Diagnosis not present

## 2017-08-16 DIAGNOSIS — H353111 Nonexudative age-related macular degeneration, right eye, early dry stage: Secondary | ICD-10-CM | POA: Diagnosis not present

## 2017-08-31 DIAGNOSIS — E039 Hypothyroidism, unspecified: Secondary | ICD-10-CM | POA: Diagnosis not present

## 2017-08-31 DIAGNOSIS — Z950 Presence of cardiac pacemaker: Secondary | ICD-10-CM | POA: Diagnosis not present

## 2017-08-31 DIAGNOSIS — I10 Essential (primary) hypertension: Secondary | ICD-10-CM | POA: Diagnosis not present

## 2017-08-31 DIAGNOSIS — E7849 Other hyperlipidemia: Secondary | ICD-10-CM | POA: Diagnosis not present

## 2017-08-31 DIAGNOSIS — I441 Atrioventricular block, second degree: Secondary | ICD-10-CM | POA: Diagnosis not present

## 2017-08-31 DIAGNOSIS — I480 Paroxysmal atrial fibrillation: Secondary | ICD-10-CM | POA: Diagnosis not present

## 2017-09-19 ENCOUNTER — Other Ambulatory Visit: Payer: Self-pay | Admitting: Internal Medicine

## 2017-11-30 ENCOUNTER — Telehealth: Payer: Self-pay | Admitting: Cardiology

## 2017-11-30 ENCOUNTER — Ambulatory Visit (INDEPENDENT_AMBULATORY_CARE_PROVIDER_SITE_OTHER): Payer: Medicare Other | Admitting: *Deleted

## 2017-11-30 DIAGNOSIS — I442 Atrioventricular block, complete: Secondary | ICD-10-CM

## 2017-11-30 NOTE — Telephone Encounter (Signed)
Spoke with pt and reminded pt of remote transmission that is due today. Pt verbalized understanding.   

## 2017-12-01 NOTE — Progress Notes (Signed)
Remote pacemaker transmission.   

## 2017-12-04 LAB — CUP PACEART REMOTE DEVICE CHECK
Battery Impedance: 1057 Ohm
Battery Remaining Longevity: 50 mo
Battery Voltage: 2.78 V
Brady Statistic AP VP Percent: 23 %
Implantable Lead Implant Date: 20060203
Implantable Lead Location: 753860
Implantable Lead Model: 4469
Implantable Lead Model: 4470
Implantable Lead Serial Number: 458590
Implantable Pulse Generator Implant Date: 20140620
Lead Channel Impedance Value: 426 Ohm
Lead Channel Impedance Value: 482 Ohm
Lead Channel Pacing Threshold Pulse Width: 0.4 ms
Lead Channel Sensing Intrinsic Amplitude: 0.7 mV
Lead Channel Setting Pacing Amplitude: 2.25 V
MDC IDC LEAD IMPLANT DT: 20060203
MDC IDC LEAD LOCATION: 753859
MDC IDC LEAD SERIAL: 500613
MDC IDC MSMT LEADCHNL RA PACING THRESHOLD AMPLITUDE: 1.125 V
MDC IDC MSMT LEADCHNL RA PACING THRESHOLD PULSEWIDTH: 0.4 ms
MDC IDC MSMT LEADCHNL RV PACING THRESHOLD AMPLITUDE: 1 V
MDC IDC SESS DTM: 20191004215357
MDC IDC SET LEADCHNL RV PACING AMPLITUDE: 2.5 V
MDC IDC SET LEADCHNL RV PACING PULSEWIDTH: 0.4 ms
MDC IDC SET LEADCHNL RV SENSING SENSITIVITY: 2.8 mV
MDC IDC STAT BRADY AP VS PERCENT: 0 %
MDC IDC STAT BRADY AS VP PERCENT: 77 %
MDC IDC STAT BRADY AS VS PERCENT: 0 %

## 2017-12-18 ENCOUNTER — Other Ambulatory Visit: Payer: Self-pay | Admitting: Internal Medicine

## 2017-12-19 IMAGING — US US BREAST*L* LIMITED INC AXILLA
1 series · 8 of 8 positions shown · non-contrast
Comparison: Previous exam(s) including 07/09/2012 and earlier
priors dating back to 11/20/2008.

CLINICAL DATA: [AGE] patient presents after recent clinical
exam, for evaluation of a possible thickening on physical
examination of the right breast inferiorly.

EXAM:
2D DIGITAL DIAGNOSTIC BILATERAL MAMMOGRAM WITH CAD AND ADJUNCT TOMO
ULTRASOUND BILATERAL BREAST

[Series 1: us breast*left* limited inc axilla · 0.06mm/px · 8 of 8 slices shown]
[im 1/8]
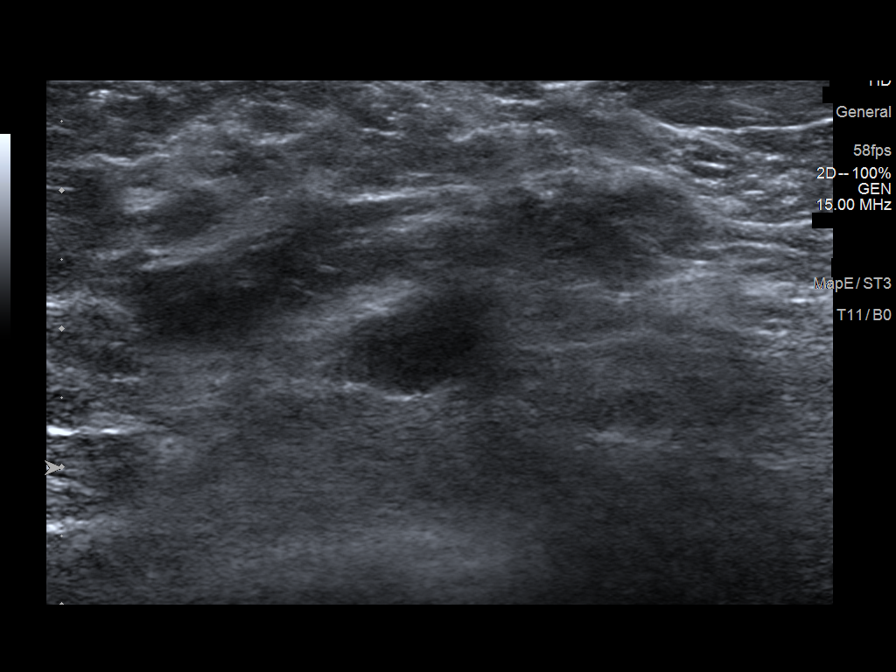
[im 2/8]
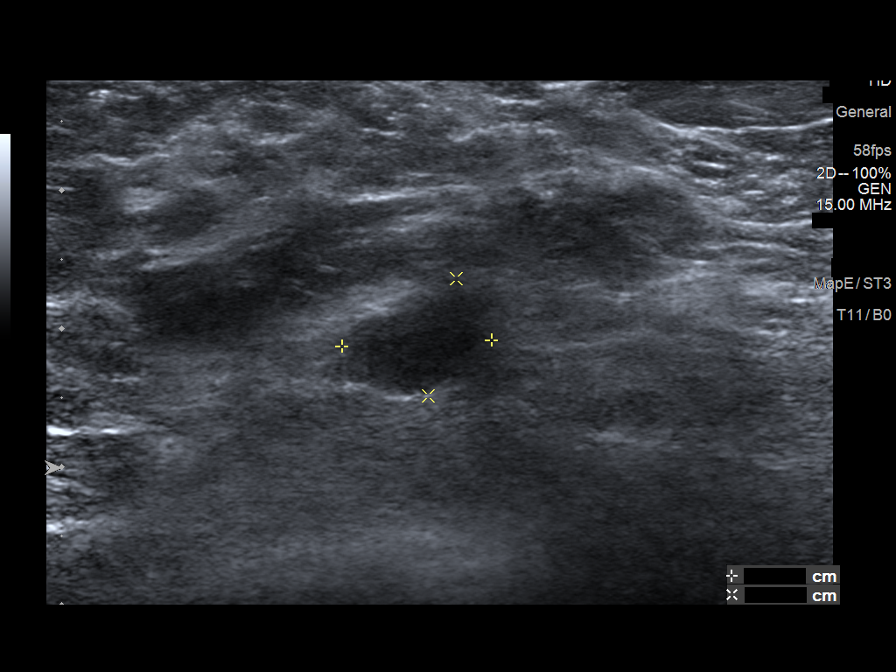
[im 3/8]
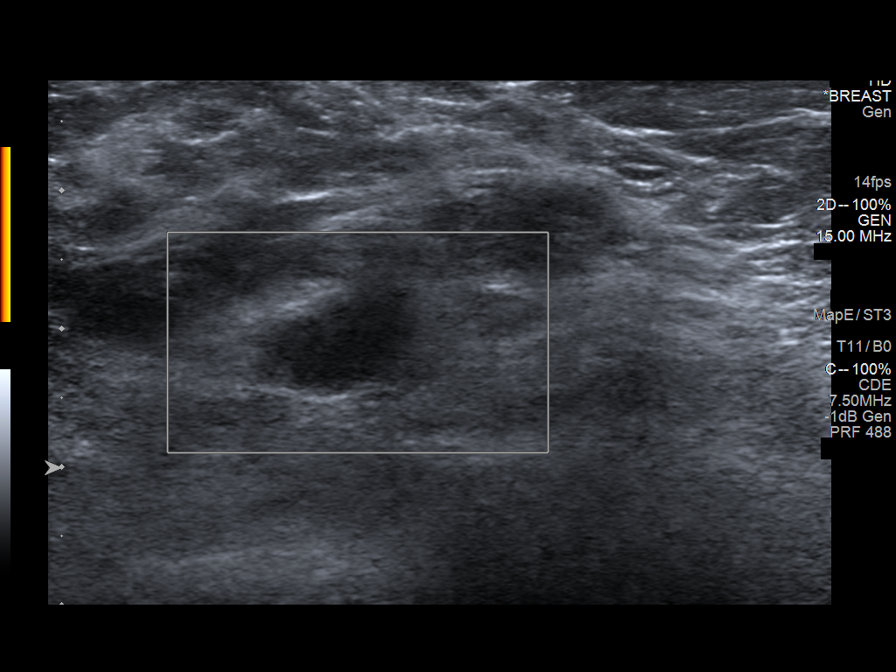
[im 4/8]
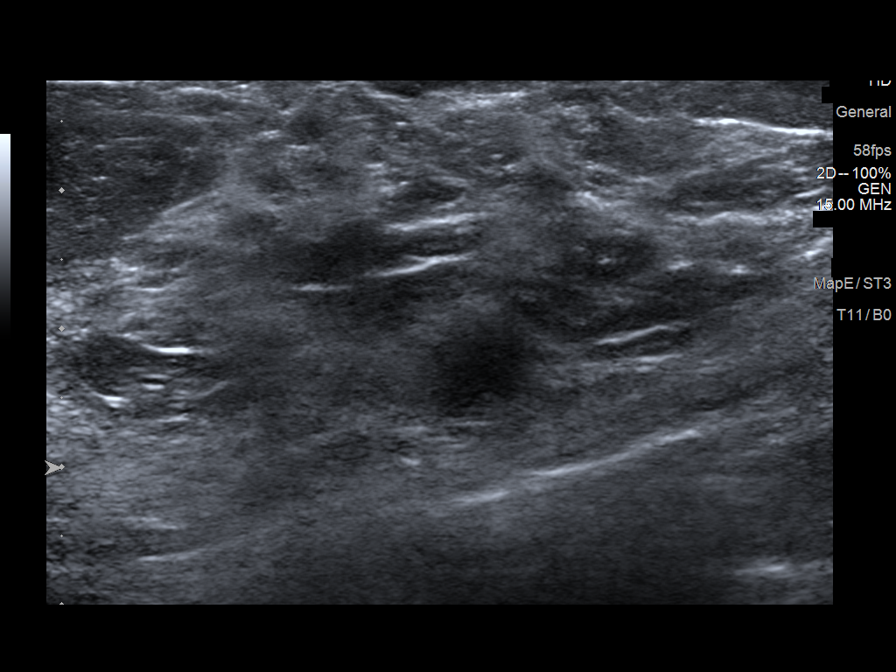
[im 5/8]
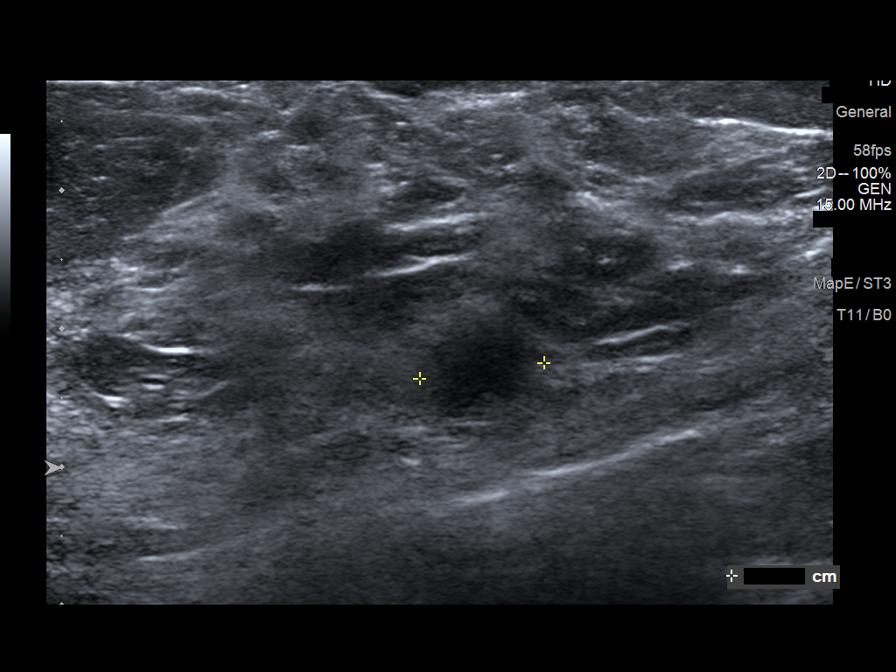
[im 6/8]
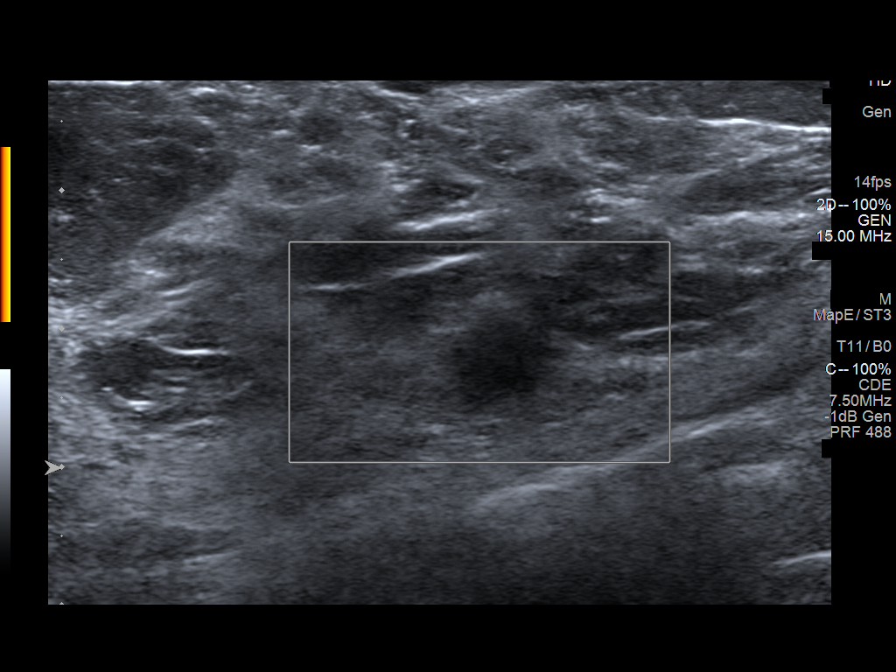
[im 7/8]
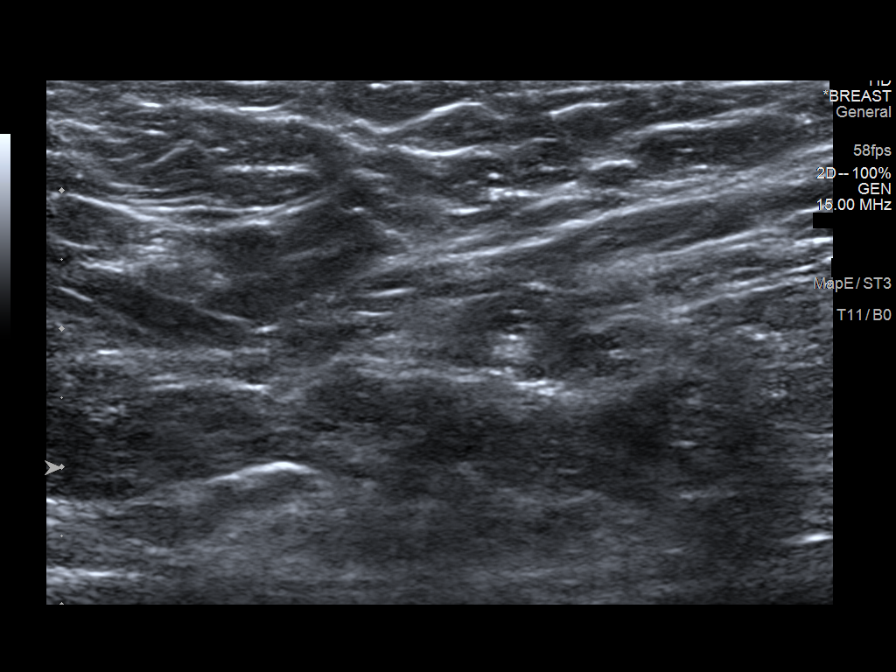
[im 8/8]
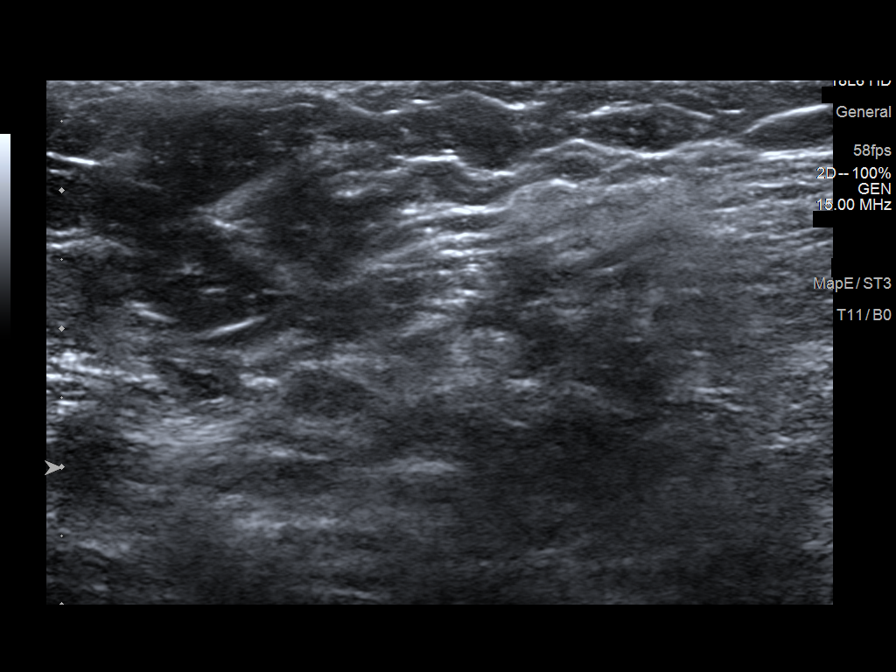

[8 of 8 positions shown; findings below may reference images not displayed]

ACR Breast Density Category b: There are scattered areas of
fibroglandular density.
FINDINGS: There is an irregular mass with spiculated margins in the
retroareolar left breast, 5-6 o'clock region, posterior third. No
suspicious microcalcification is identified in the left breast. No
areas of architectural distortion.

The parenchymal pattern of the right breast is stable. No mass,
distortion, or suspicious microcalcification is appreciated in the
right breast.

Mammographic images were processed with CAD.

On physical exam, I do not palpate a definite mass in the inferior
right breast on physical exam today. On palpation of the
retroareolar left breast, 5-6 o'clock region, I do not palpate a
discrete mass.

Targeted ultrasound is performed, showing sonographically normal
breast parenchyma in the right breast. There are no suspicious
findings on ultrasound of the right breast.

In the deep retroareolar left breast 6 o'clock region there is a
hypoechoic irregular mass with indistinct margins along its
superficial and medial aspects. The posterior margin is fairly
circumscribed. This mass measures 1.1 x 0.9 x 0.9 cm and accounts
for the mass seen on mammography.

Ultrasound of the left axilla is negative.
IMPRESSION: Suspicious mass in the retroareolar 6 o'clock LEFT breast.

No evidence of malignancy in the RIGHT breast.

RECOMMENDATION:
The procedure of ultrasound-guided biopsy is discussed with the
patient today. She would like to proceed with ultrasound-guided
biopsy of the suspicious left breast mass. This is scheduled for
07/27/2015 at 11 o'clock a.m.

I have discussed the findings and recommendations with the patient.
Results were also provided in writing at the conclusion of the
visit. If applicable, a reminder letter will be sent to the patient
regarding the next appointment.

BI-RADS CATEGORY  5: Highly suggestive of malignancy.

## 2018-01-03 ENCOUNTER — Telehealth: Payer: Self-pay | Admitting: Cardiology

## 2018-01-09 ENCOUNTER — Ambulatory Visit (INDEPENDENT_AMBULATORY_CARE_PROVIDER_SITE_OTHER): Payer: Medicare Other | Admitting: Internal Medicine

## 2018-01-09 ENCOUNTER — Encounter: Payer: Self-pay | Admitting: Internal Medicine

## 2018-01-09 VITALS — BP 152/70 | HR 77 | Ht 63.0 in | Wt 143.8 lb

## 2018-01-09 DIAGNOSIS — E782 Mixed hyperlipidemia: Secondary | ICD-10-CM | POA: Insufficient documentation

## 2018-01-09 DIAGNOSIS — Z950 Presence of cardiac pacemaker: Secondary | ICD-10-CM | POA: Diagnosis not present

## 2018-01-09 DIAGNOSIS — I10 Essential (primary) hypertension: Secondary | ICD-10-CM

## 2018-01-09 DIAGNOSIS — I480 Paroxysmal atrial fibrillation: Secondary | ICD-10-CM

## 2018-01-09 MED ORDER — METOPROLOL SUCCINATE ER 25 MG PO TB24: 25.0000 mg | ORAL_TABLET | Freq: Every day | ORAL | 3 refills | Status: DC

## 2018-01-09 NOTE — Telephone Encounter (Signed)
done

## 2018-01-09 NOTE — Patient Instructions (Addendum)
Medication Instructions:  Continue current medications If you need a refill on your cardiac medications before your next appointment, please call your pharmacy.   Lab work: NONE If you have labs (blood work) drawn today and your tests are completely normal, you will receive your results only by: Marland Kitchen MyChart Message (if you have MyChart) OR . A paper copy in the mail If you have any lab test that is abnormal or we need to change your treatment, we will call you to review the results.  Testing/Procedures: NONE  Follow-Up: Your physician wants you to follow-up in: ONE YEAR with Dr. Debara Pickett. You will receive a reminder letter in the mail two months in advance. If you don't receive a letter, please call our office to schedule the follow-up appointment.

## 2018-01-09 NOTE — Progress Notes (Signed)
OFFICE NOTE  Chief Complaint:  Establish cardiologist  Primary Care Physician: Elby Showers, MD  HPI:  [name] [name] is a 84 y.o. female patient of Dr. 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  She underwent generator change in 2014 and has had subsequent checks by Dr. Wynonia Lawman.  She most recently had a remote check performed at heart care which demonstrated adequate battery life and low burden of atrial fibrillation/atrial tachycardia of less than 0.2%.  Other medical problems include hyperlipidemia, hypothyroidism and hypertension.  These all appear to be very well controlled.  She was last seen a year ago and since then is done well.  She has no new complaints such as worsening shortness of breath or chest pain.  She did note that couple days ago she was reading a book and holding it in her left hand and noted that her hand "fell asleep" and subsequently it took several minutes for her to regain strength in it.  She does have a history of bilateral hand tingling.  In the past was thought she might have carpal tunnel syndrome.  Is followed by Dr. Renold Genta for primary care.  PMHx:  Past Medical History:  Diagnosis Date  . Breast cancer ([place])   . Cardiac pacemaker in situ 04/01/2004   Original implant 04/01/04 Dr. Doreatha Lew Indications syncope with Mobitz 2 heart block  RV lead Guidant 4470 52 cm bipolar lead, SN [id number] atrial lead Guidant bipolar serial #[id number].  Medtronic EnPulse Model number [id number], SN PNB [id number]   . Carotid bruit 10/25/2010   right  . Diverticulosis   . H/O syncope   . 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 Hiatal hernia   . HTN (hypertension), benign   . Hyperlipidemia   . Melanoma of back ([place]) 2009  . Mobitz II   . Osteoporosis     Past Surgical History:  Procedure Laterality Date  . BREAST BIOPSY  1960   Left  . BREAST LUMPECTOMY  1960   left 2017    . BREAST LUMPECTOMY WITH RADIOACTIVE SEED LOCALIZATION Left 09/10/2015   Procedure: BREAST LUMPECTOMY WITH RADIOACTIVE SEED LOCALIZATION;  Surgeon: Excell Seltzer, MD;  Location: [facility];  Service: General;  Laterality: Left;  . CATARACT EXTRACTION, BILATERAL  2001  . INSERT / REPLACE / REMOVE PACEMAKER    . LOM HEMOTYMPANUM THROAT & SINUS BX  1977  . PACEMAKER GENERATOR CHANGE N/A 08/16/2012   Procedure: PACEMAKER GENERATOR CHANGE;  Surgeon: Deboraha Sprang, MD;  Location: [facility] CATH LAB;  Service: Cardiovascular;  Laterality: N/A;  . PACEMAKER INSERTION  2006   Medtronic DDDR [id number], SERIAL #  [id number]  . surgery of ear      FAMHx:  Family History  Problem Relation Age of Onset  . Stroke Mother 88  . Kidney disease Father 35    SOCHx:   reports that she quit smoking about 71 years ago. Her smoking use included cigarettes. She has a 3.30 pack-year smoking history. She has never used smokeless tobacco. She reports that she drinks alcohol. She reports that she does not use drugs.  ALLERGIES:  Allergies  Allergen Reactions  . Adhesive [Tape] Itching and Rash    Please use "paper" tape  . Codeine Nausea Only  . Triamterene-Hctz Other (See Comments)    fatigue  . Vasotec Diarrhea    ROS: Pertinent items noted in HPI and remainder of comprehensive  ROS otherwise negative.  HOME MEDS: Current Outpatient Medications on File Prior to Visit  Medication Sig Dispense Refill  . aspirin 81 MG tablet Take 81 mg by mouth daily.     . beta carotene w/minerals (OCUVITE) tablet Take 1 tablet by mouth daily.    . Cholecalciferol (VITAMIN D-3) 1000 units CAPS Take 1,000 Units by mouth daily.    Marland Kitchen docusate sodium (COLACE) 100 MG capsule Take 100 mg by mouth daily as needed for constipation.    . hydrochlorothiazide (MICROZIDE) 12.5 MG capsule TAKE (1) CAPSULE DAILY. 90 capsule 3  . ibuprofen (ADVIL,MOTRIN) 200 MG tablet Take 200 mg by mouth as directed.    Marland Kitchen levothyroxine (SYNTHROID,  LEVOTHROID) 50 MCG tablet TAKE 1 TABLET ONCE DAILY. 90 tablet 3  . lisinopril (PRINIVIL,ZESTRIL) 10 MG tablet TAKE 1 TABLET ONCE DAILY. 90 tablet 2  . loperamide (IMODIUM A-D) 2 MG tablet Take 2 mg by mouth daily as needed for diarrhea or loose stools.    . pravastatin (PRAVACHOL) 20 MG tablet TAKE 1 TABLET EACH DAY. 90 tablet 3   No current facility-administered medications on file prior to visit.     LABS/IMAGING: No results found for this or any previous visit (from the past 48 hour(s)). No results found.  LIPID PANEL: No results found for: CHOL, TRIG, HDL, CHOLHDL, VLDL, LDLCALC, LDLDIRECT   WEIGHTS: Wt Readings from Last 3 Encounters:  01/09/18 143 lb 12.8 oz (65.2 kg)  07/19/17 142 lb (64.4 kg)  01/11/17 142 lb (64.4 kg)    VITALS: BP (!) 152/70   Pulse 77   Ht 5\' 3"  (1.6 m)   Wt 143 lb 12.8 oz (65.2 kg)   BMI 25.47 kg/m   EXAM: General appearance: alert, appears stated age and no distress Neck: no carotid bruit, no JVD and thyroid not enlarged, symmetric, no tenderness/mass/nodules Lungs: clear to auscultation bilaterally Heart: regular rate and rhythm Abdomen: soft, non-tender; bowel sounds normal; no masses,  no organomegaly Extremities: extremities normal, atraumatic, no cyanosis or edema Pulses: 2+ and symmetric Skin: Skin color, texture, turgor normal. No rashes or lesions Neurologic: Grossly normal Psych: Pleasant  EKG: A sensed-V paced rhythm at 77-- personally reviewed  ASSESSMENT: 1. Paroxysmal atrial fibrillation-not anticoagulated due to fall risk and low burden of A. Fib 2. History of second-degree AV block status post Medtronic pacemaker 3. Hypertension 4. Dyslipidemia 5. Hypothyroidism  PLAN: 1.   Mrs. Gills is doing remarkably well at 84 years of age.  She has a history of PAF however is not anticoagulated due to fall risk, age and very low burden of A. fib which is demonstrated by routine checks of her pacemaker.  Pacemaker was checked last  month and appears to be operating properly with normal battery life.  Her blood pressure was elevated little today however is well controlled at home.  Her PCP follows her cholesterol and her thyroid both of which are adequately treated.  No changes to her medicines today.  Plan follow-up annually or sooner as necessary.  Pixie Casino, MD, [facility], [place] Director of the Advanced Lipid Disorders &  Cardiovascular Risk Reduction Clinic Diplomate of the American Board of Clinical Lipidology Attending Cardiologist  Direct Dial: [phone]  Fax: [phone]  Website:  [email or web address]  01/09/2018, 10:46 AM

## 2018-01-22 DIAGNOSIS — E039 Hypothyroidism, unspecified: Secondary | ICD-10-CM | POA: Diagnosis not present

## 2018-01-22 DIAGNOSIS — Z7901 Long term (current) use of anticoagulants: Secondary | ICD-10-CM | POA: Diagnosis not present

## 2018-01-22 DIAGNOSIS — E7800 Pure hypercholesterolemia, unspecified: Secondary | ICD-10-CM | POA: Diagnosis not present

## 2018-01-22 DIAGNOSIS — I10 Essential (primary) hypertension: Secondary | ICD-10-CM | POA: Diagnosis not present

## 2018-01-22 DIAGNOSIS — E038 Other specified hypothyroidism: Secondary | ICD-10-CM | POA: Diagnosis not present

## 2018-01-22 DIAGNOSIS — E782 Mixed hyperlipidemia: Secondary | ICD-10-CM | POA: Diagnosis not present

## 2018-01-22 DIAGNOSIS — I6529 Occlusion and stenosis of unspecified carotid artery: Secondary | ICD-10-CM | POA: Diagnosis not present

## 2018-01-29 ENCOUNTER — Ambulatory Visit: Payer: Medicare Other | Admitting: Internal Medicine

## 2018-02-06 ENCOUNTER — Ambulatory Visit (INDEPENDENT_AMBULATORY_CARE_PROVIDER_SITE_OTHER): Payer: Medicare Other | Admitting: Internal Medicine

## 2018-02-06 ENCOUNTER — Encounter: Payer: Self-pay | Admitting: Internal Medicine

## 2018-02-06 VITALS — BP 140/80 | HR 80 | Temp 98.3°F | Ht 63.0 in | Wt 142.0 lb

## 2018-02-06 DIAGNOSIS — I10 Essential (primary) hypertension: Secondary | ICD-10-CM | POA: Diagnosis not present

## 2018-02-06 DIAGNOSIS — H3530 Unspecified macular degeneration: Secondary | ICD-10-CM | POA: Diagnosis not present

## 2018-02-06 DIAGNOSIS — Z950 Presence of cardiac pacemaker: Secondary | ICD-10-CM

## 2018-02-06 DIAGNOSIS — Z853 Personal history of malignant neoplasm of breast: Secondary | ICD-10-CM

## 2018-02-06 DIAGNOSIS — R600 Localized edema: Secondary | ICD-10-CM | POA: Diagnosis not present

## 2018-02-06 DIAGNOSIS — E7800 Pure hypercholesterolemia, unspecified: Secondary | ICD-10-CM

## 2018-02-06 DIAGNOSIS — E039 Hypothyroidism, unspecified: Secondary | ICD-10-CM | POA: Diagnosis not present

## 2018-02-06 DIAGNOSIS — R0989 Other specified symptoms and signs involving the circulatory and respiratory systems: Secondary | ICD-10-CM

## 2018-02-23 NOTE — Progress Notes (Signed)
   Subjective:    Patient ID: [name] [name], female    DOB: 03-12-1916, 84 y.o.   MRN: [id number]  HPI Pleasant 84 year old Female in today for 59-month recheck.  No new complaints.  She has a history of essential hypertension, hyperlipidemia, hypothyroidism pacemaker placement in 2006 for Mobitz 2 rhythm, paroxysmal atrial fibrillation.  In 2017 she had a radioactive seed localization and left lobectomy for a stage I a low-grade ER/PR positive invasive ductal carcinoma of the left breast.  Mass was 1.1 cm on ultrasound and she did not want to have radiation therapy.  Has irritable bowel symptoms after she eats dinner frequently.  She has macular degeneration.  Has elevated serum creatinine likely due to chronic kidney disease.  Dr. Debara Pickett saw her in November in Dr. Thurman Coyer absence and found her to be stable with return in 1 year.  Not thought to be a good candidate for systemic anticoagulation.  Hypertension noted be controlled.    Review of Systems no new complaints.  Received flu vaccine at [facility]     Objective:   Physical Exam  Skin warm and dry.  Nodes none.  Neck is supple without JVD thyromegaly.  She has a history of right carotid bruit.   Cardiac exam regular rate and rhythm normal S1 and S2 without murmur.  Extremities trace pitting edema likely due to venous insufficiency and dependent position.  She is alert and oriented.     Assessment & Plan:  Essential hypertension stable on current regimen  Hyperlipidemia treated with statin  History of pacemaker for Mobitz 2 rhythm  Chronic kidney disease-stable  Hypothyroidism-stable on thyroid replacement  History of paroxysmal atrial fibrillation- not a good candidate for anticoagulation-agree with Dr. Debara Pickett  History of breast cancer treated with lumpectomy and radioactive seed placement.  Declined radiation therapy.  Plan: Continue current medications and return in 6 months for annual physical examination and Medicare wellness  evaluation.

## 2018-02-23 NOTE — Patient Instructions (Signed)
It was a pleasure to see you today.  Continue same medications and follow-up in 6 months

## 2018-03-05 ENCOUNTER — Ambulatory Visit: Payer: Medicare Other | Admitting: Cardiovascular Disease

## 2018-03-06 DIAGNOSIS — Z961 Presence of intraocular lens: Secondary | ICD-10-CM | POA: Diagnosis not present

## 2018-03-06 DIAGNOSIS — H353211 Exudative age-related macular degeneration, right eye, with active choroidal neovascularization: Secondary | ICD-10-CM | POA: Diagnosis not present

## 2018-03-06 DIAGNOSIS — H40013 Open angle with borderline findings, low risk, bilateral: Secondary | ICD-10-CM | POA: Diagnosis not present

## 2018-03-07 ENCOUNTER — Encounter: Payer: Self-pay | Admitting: Cardiology

## 2018-03-07 DIAGNOSIS — H353124 Nonexudative age-related macular degeneration, left eye, advanced atrophic with subfoveal involvement: Secondary | ICD-10-CM | POA: Diagnosis not present

## 2018-03-07 DIAGNOSIS — H353111 Nonexudative age-related macular degeneration, right eye, early dry stage: Secondary | ICD-10-CM | POA: Diagnosis not present

## 2018-03-07 DIAGNOSIS — H353211 Exudative age-related macular degeneration, right eye, with active choroidal neovascularization: Secondary | ICD-10-CM | POA: Diagnosis not present

## 2018-03-07 DIAGNOSIS — H353222 Exudative age-related macular degeneration, left eye, with inactive choroidal neovascularization: Secondary | ICD-10-CM | POA: Diagnosis not present

## 2018-03-12 ENCOUNTER — Ambulatory Visit (INDEPENDENT_AMBULATORY_CARE_PROVIDER_SITE_OTHER): Payer: Medicare Other

## 2018-03-12 DIAGNOSIS — I442 Atrioventricular block, complete: Secondary | ICD-10-CM

## 2018-03-13 NOTE — Progress Notes (Signed)
Remote pacemaker transmission.   

## 2018-03-16 LAB — CUP PACEART REMOTE DEVICE CHECK
Battery Impedance: 1165 Ohm
Battery Remaining Longevity: 47 mo
Battery Voltage: 2.78 V
Brady Statistic AP VP Percent: 21 %
Brady Statistic AP VS Percent: 0 %
Brady Statistic AS VP Percent: 79 %
Brady Statistic AS VS Percent: 0 %
Date Time Interrogation Session: 20200113191911
Implantable Lead Implant Date: 20060203
Implantable Lead Implant Date: 20060203
Implantable Lead Location: 753859
Implantable Lead Model: 4469
Implantable Lead Serial Number: 458590
Implantable Lead Serial Number: 500613
Lead Channel Impedance Value: 415 Ohm
Lead Channel Impedance Value: 503 Ohm
Lead Channel Pacing Threshold Amplitude: 0.875 V
Lead Channel Pacing Threshold Amplitude: 1.125 V
Lead Channel Pacing Threshold Pulse Width: 0.4 ms
Lead Channel Pacing Threshold Pulse Width: 0.4 ms
Lead Channel Setting Pacing Amplitude: 2.25 V
Lead Channel Setting Pacing Amplitude: 2.5 V
Lead Channel Setting Pacing Pulse Width: 0.4 ms
Lead Channel Setting Sensing Sensitivity: 2.8 mV
MDC IDC LEAD LOCATION: 753860
MDC IDC PG IMPLANT DT: 20140620

## 2018-04-11 DIAGNOSIS — H353222 Exudative age-related macular degeneration, left eye, with inactive choroidal neovascularization: Secondary | ICD-10-CM | POA: Diagnosis not present

## 2018-04-11 DIAGNOSIS — H353111 Nonexudative age-related macular degeneration, right eye, early dry stage: Secondary | ICD-10-CM | POA: Diagnosis not present

## 2018-04-11 DIAGNOSIS — H353211 Exudative age-related macular degeneration, right eye, with active choroidal neovascularization: Secondary | ICD-10-CM | POA: Diagnosis not present

## 2018-04-11 DIAGNOSIS — H353124 Nonexudative age-related macular degeneration, left eye, advanced atrophic with subfoveal involvement: Secondary | ICD-10-CM | POA: Diagnosis not present

## 2018-05-03 ENCOUNTER — Other Ambulatory Visit: Payer: Self-pay | Admitting: Internal Medicine

## 2018-05-24 ENCOUNTER — Other Ambulatory Visit: Payer: Self-pay | Admitting: Internal Medicine

## 2018-06-11 ENCOUNTER — Ambulatory Visit (INDEPENDENT_AMBULATORY_CARE_PROVIDER_SITE_OTHER): Payer: Medicare Other | Admitting: *Deleted

## 2018-06-11 ENCOUNTER — Other Ambulatory Visit: Payer: Self-pay

## 2018-06-11 DIAGNOSIS — I442 Atrioventricular block, complete: Secondary | ICD-10-CM | POA: Diagnosis not present

## 2018-06-11 LAB — CUP PACEART REMOTE DEVICE CHECK
Battery Impedance: 1277 Ohm
Battery Remaining Longevity: 44 mo
Battery Voltage: 2.77 V
Brady Statistic AP VP Percent: 20 %
Brady Statistic AP VS Percent: 0 %
Brady Statistic AS VP Percent: 80 %
Brady Statistic AS VS Percent: 0 %
Date Time Interrogation Session: 20200414190244
Implantable Lead Implant Date: 20060203
Implantable Lead Implant Date: 20060203
Implantable Lead Location: 753859
Implantable Lead Location: 753860
Implantable Lead Model: 4469
Implantable Lead Model: 4470
Implantable Lead Serial Number: 458590
Implantable Lead Serial Number: 500613
Implantable Pulse Generator Implant Date: 20140620
Lead Channel Impedance Value: 420 Ohm
Lead Channel Impedance Value: 498 Ohm
Lead Channel Pacing Threshold Amplitude: 0.875 V
Lead Channel Pacing Threshold Amplitude: 1.125 V
Lead Channel Pacing Threshold Pulse Width: 0.4 ms
Lead Channel Pacing Threshold Pulse Width: 0.4 ms
Lead Channel Setting Pacing Amplitude: 2.25 V
Lead Channel Setting Pacing Amplitude: 2.5 V
Lead Channel Setting Pacing Pulse Width: 0.4 ms
Lead Channel Setting Sensing Sensitivity: 2.8 mV

## 2018-06-18 ENCOUNTER — Encounter: Payer: Self-pay | Admitting: Cardiology

## 2018-06-18 NOTE — Progress Notes (Signed)
Remote pacemaker transmission.   

## 2018-07-16 ENCOUNTER — Telehealth: Payer: Self-pay | Admitting: Internal Medicine

## 2018-07-16 DIAGNOSIS — I10 Essential (primary) hypertension: Secondary | ICD-10-CM

## 2018-07-16 DIAGNOSIS — E039 Hypothyroidism, unspecified: Secondary | ICD-10-CM

## 2018-07-16 DIAGNOSIS — Z853 Personal history of malignant neoplasm of breast: Secondary | ICD-10-CM

## 2018-07-16 DIAGNOSIS — E7800 Pure hypercholesterolemia, unspecified: Secondary | ICD-10-CM

## 2018-07-16 DIAGNOSIS — Z0000 Encounter for general adult medical examination without abnormal findings: Secondary | ICD-10-CM

## 2018-07-16 DIAGNOSIS — R0989 Other specified symptoms and signs involving the circulatory and respiratory systems: Secondary | ICD-10-CM

## 2018-07-16 DIAGNOSIS — Z950 Presence of cardiac pacemaker: Secondary | ICD-10-CM

## 2018-07-16 DIAGNOSIS — H3530 Unspecified macular degeneration: Secondary | ICD-10-CM

## 2018-07-16 NOTE — Telephone Encounter (Signed)
[name] [name] [phone]  [name] [name] called to say she needs her lab orders faxed to [facility] [phone]. Attention [name] [name]

## 2018-07-17 DIAGNOSIS — H353211 Exudative age-related macular degeneration, right eye, with active choroidal neovascularization: Secondary | ICD-10-CM | POA: Diagnosis not present

## 2018-07-18 NOTE — Telephone Encounter (Signed)
Faxed

## 2018-07-29 ENCOUNTER — Ambulatory Visit (INDEPENDENT_AMBULATORY_CARE_PROVIDER_SITE_OTHER): Payer: Medicare Other | Admitting: Internal Medicine

## 2018-07-29 ENCOUNTER — Encounter: Payer: Self-pay | Admitting: Internal Medicine

## 2018-07-29 VITALS — BP 130/70 | HR 84 | Ht 63.0 in | Wt 135.0 lb

## 2018-07-29 DIAGNOSIS — R0989 Other specified symptoms and signs involving the circulatory and respiratory systems: Secondary | ICD-10-CM

## 2018-07-29 DIAGNOSIS — E7800 Pure hypercholesterolemia, unspecified: Secondary | ICD-10-CM

## 2018-07-29 DIAGNOSIS — E039 Hypothyroidism, unspecified: Secondary | ICD-10-CM | POA: Diagnosis not present

## 2018-07-29 DIAGNOSIS — Z0000 Encounter for general adult medical examination without abnormal findings: Secondary | ICD-10-CM | POA: Diagnosis not present

## 2018-07-29 DIAGNOSIS — I10 Essential (primary) hypertension: Secondary | ICD-10-CM

## 2018-07-29 DIAGNOSIS — H3530 Unspecified macular degeneration: Secondary | ICD-10-CM

## 2018-07-29 DIAGNOSIS — H9193 Unspecified hearing loss, bilateral: Secondary | ICD-10-CM

## 2018-07-29 DIAGNOSIS — Z853 Personal history of malignant neoplasm of breast: Secondary | ICD-10-CM | POA: Diagnosis not present

## 2018-07-29 DIAGNOSIS — Z950 Presence of cardiac pacemaker: Secondary | ICD-10-CM

## 2018-07-29 LAB — POCT URINALYSIS DIPSTICK
Appearance: NEGATIVE
Bilirubin, UA: NEGATIVE
Blood, UA: NEGATIVE
Glucose, UA: NEGATIVE
Ketones, UA: NEGATIVE
Leukocytes, UA: NEGATIVE
Nitrite, UA: NEGATIVE
Odor: NEGATIVE
Protein, UA: NEGATIVE
Spec Grav, UA: 1.02 (ref 1.010–1.025)
Urobilinogen, UA: 0.2 E.U./dL
pH, UA: 6 (ref 5.0–8.0)

## 2018-07-29 NOTE — Addendum Note (Signed)
Addended by: Mady Haagensen on: 07/29/2018 09:50 AM   Modules accepted: Orders

## 2018-07-30 LAB — CBC WITH DIFFERENTIAL/PLATELET
Absolute Monocytes: 936 cells/uL (ref 200–950)
Basophils Absolute: 62 cells/uL (ref 0–200)
Basophils Relative: 0.6 %
Eosinophils Absolute: 135 cells/uL (ref 15–500)
Eosinophils Relative: 1.3 %
HCT: 42.5 % (ref 35.0–45.0)
Hemoglobin: 13.4 g/dL (ref 11.7–15.5)
Lymphs Abs: 1477 cells/uL (ref 850–3900)
MCH: 28.2 pg (ref 27.0–33.0)
MCHC: 31.5 g/dL — ABNORMAL LOW (ref 32.0–36.0)
MCV: 89.5 fL (ref 80.0–100.0)
MPV: 12.2 fL (ref 7.5–12.5)
Monocytes Relative: 9 %
Neutro Abs: 7790 cells/uL (ref 1500–7800)
Neutrophils Relative %: 74.9 %
Platelets: 189 10*3/uL (ref 140–400)
RBC: 4.75 10*6/uL (ref 3.80–5.10)
RDW: 12.8 % (ref 11.0–15.0)
Total Lymphocyte: 14.2 %
WBC: 10.4 10*3/uL (ref 3.8–10.8)

## 2018-07-30 LAB — COMPLETE METABOLIC PANEL WITH GFR
AG Ratio: 1.7 (calc) (ref 1.0–2.5)
ALT: 38 U/L — ABNORMAL HIGH (ref 6–29)
AST: 28 U/L (ref 10–35)
Albumin: 4.1 g/dL (ref 3.6–5.1)
Alkaline phosphatase (APISO): 76 U/L (ref 37–153)
BUN/Creatinine Ratio: 26 (calc) — ABNORMAL HIGH (ref 6–22)
BUN: 47 mg/dL — ABNORMAL HIGH (ref 7–25)
CO2: 24 mmol/L (ref 20–32)
Calcium: 9.7 mg/dL (ref 8.6–10.4)
Chloride: 104 mmol/L (ref 98–110)
Creat: 1.81 mg/dL — ABNORMAL HIGH (ref 0.60–0.88)
GFR, Est African American: 26 mL/min/{1.73_m2} — ABNORMAL LOW (ref >=60)
GFR, Est Non African American: 23 mL/min/{1.73_m2} — ABNORMAL LOW (ref >=60)
Globulin: 2.4 g/dL (calc) (ref 1.9–3.7)
Glucose, Bld: 88 mg/dL (ref 65–99)
Potassium: 4.8 mmol/L (ref 3.5–5.3)
Sodium: 142 mmol/L (ref 135–146)
Total Bilirubin: 0.6 mg/dL (ref 0.2–1.2)
Total Protein: 6.5 g/dL (ref 6.1–8.1)

## 2018-07-30 LAB — LIPID PANEL
Cholesterol: 178 mg/dL (ref <200)
HDL: 62 mg/dL (ref >=50)
LDL Cholesterol (Calc): 95 mg/dL (calc)
Non-HDL Cholesterol (Calc): 116 mg/dL (calc) (ref <130)
Total CHOL/HDL Ratio: 2.9 (calc) (ref <5.0)
Triglycerides: 118 mg/dL (ref <150)

## 2018-07-30 LAB — TSH: TSH: 2.45 mIU/L (ref 0.40–4.50)

## 2018-08-03 ENCOUNTER — Other Ambulatory Visit: Payer: Self-pay | Admitting: Internal Medicine
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
## 2018-08-18 NOTE — Patient Instructions (Signed)
It was a great pleasure to see you today.  Continue same medications.  Lets follow-up in 6 months.

## 2018-08-21 DIAGNOSIS — H353111 Nonexudative age-related macular degeneration, right eye, early dry stage: Secondary | ICD-10-CM | POA: Diagnosis not present

## 2018-08-21 DIAGNOSIS — H353211 Exudative age-related macular degeneration, right eye, with active choroidal neovascularization: Secondary | ICD-10-CM | POA: Diagnosis not present

## 2018-09-10 ENCOUNTER — Ambulatory Visit (INDEPENDENT_AMBULATORY_CARE_PROVIDER_SITE_OTHER): Payer: Medicare Other | Admitting: *Deleted

## 2018-09-10 DIAGNOSIS — I442 Atrioventricular block, complete: Secondary | ICD-10-CM

## 2018-09-10 DIAGNOSIS — I480 Paroxysmal atrial fibrillation: Secondary | ICD-10-CM

## 2018-09-10 LAB — CUP PACEART REMOTE DEVICE CHECK
Battery Impedance: 1387 Ohm
Battery Remaining Longevity: 41 mo
Battery Voltage: 2.76 V
Brady Statistic AP VP Percent: 20 %
Brady Statistic AP VS Percent: 0 %
Brady Statistic AS VP Percent: 80 %
Brady Statistic AS VS Percent: 0 %
Date Time Interrogation Session: 20200714181448
Implantable Lead Implant Date: 20060203
Implantable Lead Implant Date: 20060203
Implantable Lead Location: 753859
Implantable Lead Location: 753860
Implantable Lead Model: 4469
Implantable Lead Model: 4470
Implantable Lead Serial Number: 458590
Implantable Lead Serial Number: 500613
Implantable Pulse Generator Implant Date: 20140620
Lead Channel Impedance Value: 426 Ohm
Lead Channel Impedance Value: 475 Ohm
Lead Channel Pacing Threshold Amplitude: 1 V
Lead Channel Pacing Threshold Amplitude: 1.125 V
Lead Channel Pacing Threshold Pulse Width: 0.4 ms
Lead Channel Pacing Threshold Pulse Width: 0.4 ms
Lead Channel Setting Pacing Amplitude: 2.25 V
Lead Channel Setting Pacing Amplitude: 2.5 V
Lead Channel Setting Pacing Pulse Width: 0.4 ms
Lead Channel Setting Sensing Sensitivity: 2.8 mV

## 2018-09-15 ENCOUNTER — Other Ambulatory Visit: Payer: Self-pay | Admitting: Internal Medicine

## 2018-09-25 DIAGNOSIS — H353111 Nonexudative age-related macular degeneration, right eye, early dry stage: Secondary | ICD-10-CM | POA: Diagnosis not present

## 2018-09-25 DIAGNOSIS — H353211 Exudative age-related macular degeneration, right eye, with active choroidal neovascularization: Secondary | ICD-10-CM | POA: Diagnosis not present

## 2018-09-25 NOTE — Progress Notes (Signed)
Remote pacemaker transmission.   

## 2018-10-30 DIAGNOSIS — H353211 Exudative age-related macular degeneration, right eye, with active choroidal neovascularization: Secondary | ICD-10-CM | POA: Diagnosis not present

## 2018-10-30 DIAGNOSIS — H353111 Nonexudative age-related macular degeneration, right eye, early dry stage: Secondary | ICD-10-CM | POA: Diagnosis not present

## 2018-12-04 DIAGNOSIS — H353111 Nonexudative age-related macular degeneration, right eye, early dry stage: Secondary | ICD-10-CM | POA: Diagnosis not present

## 2018-12-04 DIAGNOSIS — H353211 Exudative age-related macular degeneration, right eye, with active choroidal neovascularization: Secondary | ICD-10-CM | POA: Diagnosis not present

## 2018-12-04 DIAGNOSIS — H353124 Nonexudative age-related macular degeneration, left eye, advanced atrophic with subfoveal involvement: Secondary | ICD-10-CM | POA: Diagnosis not present

## 2018-12-11 ENCOUNTER — Ambulatory Visit (INDEPENDENT_AMBULATORY_CARE_PROVIDER_SITE_OTHER): Payer: Medicare Other | Admitting: *Deleted

## 2018-12-11 DIAGNOSIS — I480 Paroxysmal atrial fibrillation: Secondary | ICD-10-CM

## 2018-12-11 DIAGNOSIS — I442 Atrioventricular block, complete: Secondary | ICD-10-CM

## 2018-12-11 LAB — CUP PACEART REMOTE DEVICE CHECK
Battery Impedance: 1528 Ohm
Battery Remaining Longevity: 38 mo
Battery Voltage: 2.76 V
Brady Statistic AP VP Percent: 19 %
Brady Statistic AP VS Percent: 0 %
Brady Statistic AS VP Percent: 80 %
Brady Statistic AS VS Percent: 0 %
Date Time Interrogation Session: 20201014210845
Implantable Lead Implant Date: 20060203
Implantable Lead Implant Date: 20060203
Implantable Lead Location: 753859
Implantable Lead Location: 753860
Implantable Lead Model: 4469
Implantable Lead Model: 4470
Implantable Lead Serial Number: 458590
Implantable Lead Serial Number: 500613
Implantable Pulse Generator Implant Date: 20140620
Lead Channel Impedance Value: 405 Ohm
Lead Channel Impedance Value: 503 Ohm
Lead Channel Pacing Threshold Amplitude: 1.125 V
Lead Channel Pacing Threshold Amplitude: 1.125 V
Lead Channel Pacing Threshold Pulse Width: 0.4 ms
Lead Channel Pacing Threshold Pulse Width: 0.4 ms
Lead Channel Setting Pacing Amplitude: 2.25 V
Lead Channel Setting Pacing Amplitude: 2.5 V
Lead Channel Setting Pacing Pulse Width: 0.4 ms
Lead Channel Setting Sensing Sensitivity: 2.8 mV

## 2018-12-13 ENCOUNTER — Other Ambulatory Visit: Payer: Self-pay | Admitting: Internal Medicine

## 2018-12-23 NOTE — Progress Notes (Signed)
Remote pacemaker transmission.   

## 2019-01-01 ENCOUNTER — Telehealth: Payer: Self-pay | Admitting: Internal Medicine

## 2019-01-01 NOTE — Telephone Encounter (Signed)
Patient agrees to phone call virtual visit with MD Advised to check BP, pulse, weight (vitals) She is aware she will get a call about 10-15 minutes before appt to review meds, allergies, vitals  FULL LENGTH CONSENT FOR TELE-HEALTH VISIT   I hereby voluntarily request, consent and authorize [facility] and its employed or contracted physicians, physician assistants, nurse practitioners or other licensed health care professionals (the Practitioner), to provide me with telemedicine health care services (the "Services") as deemed necessary by the treating Practitioner. I acknowledge and consent to receive the Services by the Practitioner via telemedicine. I understand that the telemedicine visit will involve communicating with the Practitioner through live audiovisual communication technology and the disclosure of certain medical information by electronic transmission. I acknowledge that I have been given the opportunity to request an in-person assessment or other available alternative prior to the telemedicine visit and am voluntarily participating in the telemedicine visit.  I understand that I have the right to withhold or withdraw my consent to the use of telemedicine in the course of my care at any time, without affecting my right to future care or treatment, and that the Practitioner or I may terminate the telemedicine visit at any time. I understand that I have the right to inspect all information obtained and/or recorded in the course of the telemedicine visit and may receive copies of available information for a reasonable fee.  I understand that some of the potential risks of receiving the Services via telemedicine include:  Marland Kitchen Delay or interruption in medical evaluation due to technological equipment failure or disruption; . Information transmitted may not be sufficient (e.g. poor resolution of images) to allow for appropriate medical decision making by the Practitioner; and/or  . In rare  instances, security protocols could fail, causing a breach of personal health information.  Furthermore, I acknowledge that it is my responsibility to provide information about my medical history, conditions and care that is complete and accurate to the best of my ability. I acknowledge that Practitioner's advice, recommendations, and/or decision may be based on factors not within their control, such as incomplete or inaccurate data provided by me or distortions of diagnostic images or specimens that may result from electronic transmissions. I understand that the practice of medicine is not an exact science and that Practitioner makes no warranties or guarantees regarding treatment outcomes. I acknowledge that I will receive a copy of this consent concurrently upon execution via email to the email address I last provided but may also request a printed copy by calling the office of [facility].    I understand that my insurance will be billed for this visit.   I have read or had this consent read to me. . I understand the contents of this consent, which adequately explains the benefits and risks of the Services being provided via telemedicine.  . I have been provided ample opportunity to ask questions regarding this consent and the Services and have had my questions answered to my satisfaction. . I give my informed consent for the services to be provided through the use of telemedicine in my medical care  By participating in this telemedicine visit I agree to the above.

## 2019-01-09 ENCOUNTER — Telehealth (INDEPENDENT_AMBULATORY_CARE_PROVIDER_SITE_OTHER): Payer: Medicare Other | Admitting: Internal Medicine

## 2019-01-09 ENCOUNTER — Encounter: Payer: Self-pay | Admitting: Internal Medicine

## 2019-01-09 VITALS — BP 164/76 | HR 85 | Temp 96.8°F | Ht 63.0 in | Wt 132.0 lb

## 2019-01-09 DIAGNOSIS — R7989 Other specified abnormal findings of blood chemistry: Secondary | ICD-10-CM

## 2019-01-09 DIAGNOSIS — I480 Paroxysmal atrial fibrillation: Secondary | ICD-10-CM | POA: Diagnosis not present

## 2019-01-09 DIAGNOSIS — I442 Atrioventricular block, complete: Secondary | ICD-10-CM

## 2019-01-09 DIAGNOSIS — I10 Essential (primary) hypertension: Secondary | ICD-10-CM | POA: Diagnosis not present

## 2019-01-09 DIAGNOSIS — Z950 Presence of cardiac pacemaker: Secondary | ICD-10-CM

## 2019-01-09 DIAGNOSIS — Z79899 Other long term (current) drug therapy: Secondary | ICD-10-CM

## 2019-01-09 NOTE — Patient Instructions (Signed)
Medication Instructions:  Your physician recommends that you continue on your current medications as directed. Please refer to the Current Medication list given to you today.  *If you need a refill on your cardiac medications before your next appointment, please call your pharmacy*  Lab Work: BMET to check kidney function  -- this can be done at any [facility] OR Dr. Lysbeth Penner office   If you have labs (blood work) drawn today and your tests are completely normal, you will receive your results only by: Marland Kitchen MyChart Message (if you have MyChart) OR . A paper copy in the mail If you have any lab test that is abnormal or we need to change your treatment, we will call you to review the results.  Testing/Procedures: NONE  Follow-Up: At [facility], you and your health needs are our priority.  As part of our continuing mission to provide you with exceptional heart care, we have created designated Provider Care Teams.  These Care Teams include your primary Cardiologist (physician) and Advanced Practice Providers (APPs -  Physician Assistants and Nurse Practitioners) who all work together to provide you with the care you need, when you need it.  Your next appointment:   12 months  The format for your next appointment:   In Person  Provider:   K. Mali Hilty, MD  Other Instructions

## 2019-01-09 NOTE — Progress Notes (Signed)
Virtual Visit via Telephone Note   This visit type was conducted due to national recommendations for restrictions regarding the COVID-19 Pandemic (e.g. social distancing) in an effort to limit this patient's exposure and mitigate transmission in our community.  Due to her co-morbid illnesses, this patient is at least at moderate risk for complications without adequate follow up.  This format is felt to be most appropriate for this patient at this time.  The patient did not have access to video technology/had technical difficulties with video requiring transitioning to audio format only (telephone).  All issues noted in this document were discussed and addressed.  No physical exam could be performed with this format.  Please refer to the patient's chart for her  consent to telehealth for [facility].   Evaluation Performed:  Telephone follow-up  Date:  01/09/2019   ID:  [name] [name], DOB 11/16/1916, MRN [id number]  Patient Location:  [place] [address]  Provider location:   [address] [place], [place] [address]  PCP:  Elby Showers, MD  Cardiologist:  No primary care provider on file. Electrophysiologist:  None   Chief Complaint:  No complaints  History of Present Illness:    [name] [name] is a 85 y.o. female who presents via audio/video conferencing for a telehealth visit today.  [name] [name] returns today for follow-up.  She is now 85 years old.  Overall she is doing well.  She denies any chest pain or worsening shortness of breath.  She saw her PCP this summer.  Labs were performed which overall look pretty good except for her renal function.  Creatinine was 1.8.  The last lab value that I had in our system for comparison was several years ago and was 1.1.  It was commented on this may be normal for her age however the creatinine seems quite high.  She is on a diuretic which may need to be adjusted if her renal function is worsened.  She  has had remote pacer checks most recently which showed a very low burden of A. fib.  The device is working properly with adequate battery life.  The patient does not have symptoms concerning for COVID-19 infection (fever, chills, cough, or new SHORTNESS OF BREATH).    Prior CV studies:   The following studies were reviewed today:  Remote checks Lab work Office visits  PMHx:  Past Medical History:  Diagnosis Date  . Breast cancer ([place])   . Cardiac pacemaker in situ 04/01/2004   Original implant 04/01/04 Dr. Doreatha Lew Indications syncope with Mobitz 2 heart block  RV lead Guidant 4470 52 cm bipolar lead, SN [id number] atrial lead Guidant bipolar serial #[id number].  Medtronic EnPulse Model number [id number], SN PNB [id number]   . Carotid bruit 10/25/2010   right  . Diverticulosis   . H/O syncope   . 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 Hiatal hernia   . HTN (hypertension), benign   . Hyperlipidemia   . Melanoma of back ([place]) 2009  . Mobitz II   . Osteoporosis     Past Surgical History:  Procedure Laterality Date  . BREAST BIOPSY  1960   Left  . BREAST LUMPECTOMY  1960   left 2017  . BREAST LUMPECTOMY WITH RADIOACTIVE SEED LOCALIZATION Left 09/10/2015   Procedure: BREAST LUMPECTOMY WITH RADIOACTIVE SEED LOCALIZATION;  Surgeon: Excell Seltzer, MD;  Location: [facility];  Service: General;  Laterality: Left;  . CATARACT EXTRACTION,  BILATERAL  2001  . INSERT / REPLACE / REMOVE PACEMAKER    . LOM HEMOTYMPANUM THROAT & SINUS BX  1977  . PACEMAKER GENERATOR CHANGE N/A 08/16/2012   Procedure: PACEMAKER GENERATOR CHANGE;  Surgeon: Deboraha Sprang, MD;  Location: [facility] CATH LAB;  Service: Cardiovascular;  Laterality: N/A;  . PACEMAKER INSERTION  2006   Medtronic DDDR E2DRO1, SERIAL #  [id number]  . surgery of ear      FAMHx:  Family History  Problem Relation Age of Onset  . Stroke Mother 75  . Kidney disease Father 31    SOCHx:   reports that she quit smoking about 72 years ago. Her  smoking use included cigarettes. She has a 3.30 pack-year smoking history. She has never used smokeless tobacco. She reports current alcohol use. She reports that she does not use drugs.  ALLERGIES:  Allergies  Allergen Reactions  . Adhesive [Tape] Itching and Rash    Please use "paper" tape  . Codeine Nausea Only  . Triamterene-Hctz Other (See Comments)    fatigue  . Vasotec Diarrhea    MEDS:  Current Meds  Medication Sig  . aspirin 81 MG tablet Take 81 mg by mouth daily.   . beta carotene w/minerals (OCUVITE) tablet Take 1 tablet by mouth daily.  . Cholecalciferol (VITAMIN D-3) 1000 units CAPS Take 1,000 Units by mouth daily.  Marland Kitchen docusate sodium (COLACE) 100 MG capsule Take 100 mg by mouth daily as needed for constipation.  . hydrochlorothiazide (MICROZIDE) 12.5 MG capsule TAKE (1) CAPSULE DAILY.  Marland Kitchen ibuprofen (ADVIL,MOTRIN) 200 MG tablet Take 200 mg by mouth as directed.  Marland Kitchen levothyroxine (SYNTHROID) 50 MCG tablet TAKE 1 TABLET ONCE DAILY.  Marland Kitchen lisinopril (ZESTRIL) 10 MG tablet TAKE 1 TABLET ONCE DAILY.  Marland Kitchen loperamide (IMODIUM A-D) 2 MG tablet Take 2 mg by mouth daily as needed for diarrhea or loose stools.  . metoprolol succinate (TOPROL-XL) 25 MG 24 hr tablet Take 1 tablet (25 mg total) by mouth daily.  . pravastatin (PRAVACHOL) 20 MG tablet TAKE 1 TABLET EACH DAY.     ROS: Pertinent items noted in HPI and remainder of comprehensive ROS otherwise negative.  Labs/Other Tests and Data Reviewed:    Recent Labs: 07/29/2018: ALT 38; BUN 47; Creat 1.81; Hemoglobin 13.4; Platelets 189; Potassium 4.8; Sodium 142; TSH 2.45   Recent Lipid Panel Lab Results  Component Value Date/Time   CHOL 178 07/29/2018 10:46 AM   TRIG 118 07/29/2018 10:46 AM   HDL 62 07/29/2018 10:46 AM   CHOLHDL 2.9 07/29/2018 10:46 AM   LDLCALC 95 07/29/2018 10:46 AM    Wt Readings from Last 3 Encounters:  01/09/19 132 lb (59.9 kg)  07/29/18 135 lb (61.2 kg)  02/06/18 142 lb (64.4 kg)     Exam:    Vital  Signs:  BP (!) 164/76   Pulse 85   Temp (!) 96.8 F (36 C)   Ht 5\' 3"  (1.6 m)   Wt 132 lb (59.9 kg)   SpO2 97%   BMI 23.38 kg/m    Exam not performed due to telephone visit  ASSESSMENT & PLAN:    1. Paroxysmal atrial fibrillation-not anticoagulated due to fall risk and low burden of A. Fib 2. History of second-degree AV block status post Medtronic pacemaker 3. Hypertension 4. Dyslipidemia 5. Hypothyroidism 6. Elevated creatinine to 1.8 (07/2018)  Ms. Lariviere is doing well and is asymptomatic.  Her pacer is working properly.  Her A. fib burden is low and  she is not on anticoagulation due to fall risk and low burden.  Blood pressure has been well controlled.  Her cholesterol is at goal.  Recently her creatinine was elevated to 1.8.  She did not have follow-up labs.  This was not commented on as necessarily abnormal for her age.  I would like to recheck that.  Hopefully this is improved if not we may need to adjust her medications.  Follow-up with me annually or sooner as necessary.  COVID-19 Education: The signs and symptoms of COVID-19 were discussed with the patient and how to seek care for testing (follow up with PCP or arrange E-visit).  The importance of social distancing was discussed today.  Patient Risk:   After full review of this patients clinical status, I feel that they are at least moderate risk at this time.  Time:   Today, I have spent 25 minutes with the patient with telehealth technology discussing pacemaker, A. fib, hypertension, dyslipidemia and abnormal renal function.     Medication Adjustments/Labs and Tests Ordered: Current medicines are reviewed at length with the patient today.  Concerns regarding medicines are outlined above.   Tests Ordered: Orders Placed This Encounter  Procedures  . Basic metabolic panel    Medication Changes: No orders of the defined types were placed in this encounter.   Disposition:  in 1 year(s)  Pixie Casino, MD, [facility],  [place] Director of the Advanced Lipid Disorders &  Cardiovascular Risk Reduction Clinic Diplomate of the American Board of Clinical Lipidology Attending Cardiologist  Direct Dial: [phone]  Fax: [phone]  Website:  [email or web address]  Pixie Casino, MD  01/09/2019 10:01 AM

## 2019-01-15 DIAGNOSIS — H353222 Exudative age-related macular degeneration, left eye, with inactive choroidal neovascularization: Secondary | ICD-10-CM | POA: Diagnosis not present

## 2019-01-15 DIAGNOSIS — H43812 Vitreous degeneration, left eye: Secondary | ICD-10-CM | POA: Diagnosis not present

## 2019-01-15 DIAGNOSIS — H353211 Exudative age-related macular degeneration, right eye, with active choroidal neovascularization: Secondary | ICD-10-CM | POA: Diagnosis not present

## 2019-01-15 DIAGNOSIS — H353124 Nonexudative age-related macular degeneration, left eye, advanced atrophic with subfoveal involvement: Secondary | ICD-10-CM | POA: Diagnosis not present

## 2019-01-20 DIAGNOSIS — I480 Paroxysmal atrial fibrillation: Secondary | ICD-10-CM | POA: Diagnosis not present

## 2019-01-20 DIAGNOSIS — Z79899 Other long term (current) drug therapy: Secondary | ICD-10-CM | POA: Diagnosis not present

## 2019-01-28 ENCOUNTER — Other Ambulatory Visit: Payer: Self-pay | Admitting: Internal Medicine

## 2019-03-03 DIAGNOSIS — Z23 Encounter for immunization: Secondary | ICD-10-CM | POA: Diagnosis not present

## 2019-03-09 DIAGNOSIS — H6503 Acute serous otitis media, bilateral: Secondary | ICD-10-CM | POA: Diagnosis not present

## 2019-03-11 ENCOUNTER — Other Ambulatory Visit: Payer: Self-pay | Admitting: Internal Medicine

## 2019-03-12 ENCOUNTER — Ambulatory Visit (INDEPENDENT_AMBULATORY_CARE_PROVIDER_SITE_OTHER): Payer: Medicare Other | Admitting: *Deleted

## 2019-03-12 DIAGNOSIS — I480 Paroxysmal atrial fibrillation: Secondary | ICD-10-CM

## 2019-03-12 LAB — CUP PACEART REMOTE DEVICE CHECK
Battery Impedance: 1677 Ohm
Battery Remaining Longevity: 35 mo
Battery Voltage: 2.77 V
Brady Statistic AP VP Percent: 19 %
Brady Statistic AP VS Percent: 0 %
Brady Statistic AS VP Percent: 81 %
Brady Statistic AS VS Percent: 0 %
Date Time Interrogation Session: 20210113134412
Implantable Lead Implant Date: 20060203
Implantable Lead Implant Date: 20060203
Implantable Lead Location: 753859
Implantable Lead Location: 753860
Implantable Lead Model: 4469
Implantable Lead Model: 4470
Implantable Lead Serial Number: 458590
Implantable Lead Serial Number: 500613
Implantable Pulse Generator Implant Date: 20140620
Lead Channel Impedance Value: 416 Ohm
Lead Channel Impedance Value: 523 Ohm
Lead Channel Pacing Threshold Amplitude: 1 V
Lead Channel Pacing Threshold Amplitude: 1.125 V
Lead Channel Pacing Threshold Pulse Width: 0.4 ms
Lead Channel Pacing Threshold Pulse Width: 0.4 ms
Lead Channel Setting Pacing Amplitude: 2 V
Lead Channel Setting Pacing Amplitude: 2.5 V
Lead Channel Setting Pacing Pulse Width: 0.4 ms
Lead Channel Setting Sensing Sensitivity: 2.8 mV

## 2019-03-31 DIAGNOSIS — Z23 Encounter for immunization: Secondary | ICD-10-CM | POA: Diagnosis not present

## 2019-04-16 DIAGNOSIS — H353222 Exudative age-related macular degeneration, left eye, with inactive choroidal neovascularization: Secondary | ICD-10-CM | POA: Diagnosis not present

## 2019-04-16 DIAGNOSIS — H353111 Nonexudative age-related macular degeneration, right eye, early dry stage: Secondary | ICD-10-CM | POA: Diagnosis not present

## 2019-04-16 DIAGNOSIS — H353211 Exudative age-related macular degeneration, right eye, with active choroidal neovascularization: Secondary | ICD-10-CM | POA: Diagnosis not present

## 2019-04-16 DIAGNOSIS — H353124 Nonexudative age-related macular degeneration, left eye, advanced atrophic with subfoveal involvement: Secondary | ICD-10-CM | POA: Diagnosis not present

## 2019-04-24 ENCOUNTER — Other Ambulatory Visit: Payer: Self-pay

## 2019-04-24 ENCOUNTER — Encounter: Payer: Self-pay | Admitting: Nurse Practitioner

## 2019-04-24 ENCOUNTER — Non-Acute Institutional Stay: Payer: Medicare Other | Admitting: Nurse Practitioner

## 2019-04-24 DIAGNOSIS — G629 Polyneuropathy, unspecified: Secondary | ICD-10-CM | POA: Diagnosis not present

## 2019-04-24 DIAGNOSIS — E039 Hypothyroidism, unspecified: Secondary | ICD-10-CM | POA: Diagnosis not present

## 2019-04-24 DIAGNOSIS — M7122 Synovial cyst of popliteal space [Baker], left knee: Secondary | ICD-10-CM | POA: Insufficient documentation

## 2019-04-24 DIAGNOSIS — M810 Age-related osteoporosis without current pathological fracture: Secondary | ICD-10-CM | POA: Diagnosis not present

## 2019-04-24 DIAGNOSIS — N184 Chronic kidney disease, stage 4 (severe): Secondary | ICD-10-CM | POA: Diagnosis not present

## 2019-04-24 DIAGNOSIS — I10 Essential (primary) hypertension: Secondary | ICD-10-CM

## 2019-04-24 DIAGNOSIS — M8949 Other hypertrophic osteoarthropathy, multiple sites: Secondary | ICD-10-CM | POA: Diagnosis not present

## 2019-04-24 DIAGNOSIS — M159 Polyosteoarthritis, unspecified: Secondary | ICD-10-CM | POA: Insufficient documentation

## 2019-04-24 DIAGNOSIS — N183 Chronic kidney disease, stage 3 unspecified: Secondary | ICD-10-CM | POA: Insufficient documentation

## 2019-04-24 DIAGNOSIS — M150 Primary generalized (osteo)arthritis: Secondary | ICD-10-CM

## 2019-04-24 DIAGNOSIS — I480 Paroxysmal atrial fibrillation: Secondary | ICD-10-CM

## 2019-04-24 DIAGNOSIS — R350 Frequency of micturition: Secondary | ICD-10-CM

## 2019-04-24 DIAGNOSIS — E7800 Pure hypercholesterolemia, unspecified: Secondary | ICD-10-CM | POA: Diagnosis not present

## 2019-04-24 DIAGNOSIS — M1990 Unspecified osteoarthritis, unspecified site: Secondary | ICD-10-CM | POA: Insufficient documentation

## 2019-04-24 NOTE — Assessment & Plan Note (Signed)
Heart rate is in control, continue Metoprolol 25mg  qd, update TSH.

## 2019-04-24 NOTE — Assessment & Plan Note (Signed)
?   Fullness noted in the left popliteal area. No pain

## 2019-04-24 NOTE — Assessment & Plan Note (Addendum)
Continue Levothyroxine 55mcg qd, update TSH, CBC/diff. TSH 2.45 07/2018

## 2019-04-24 NOTE — Assessment & Plan Note (Signed)
eGFR 23 07/2018, update CMP/eGFR

## 2019-04-24 NOTE — Assessment & Plan Note (Signed)
Tingling, numbness in fingers/hand, worse at night or holding book too long, it was thought to be carpal tunnel syndrome of the right, splint at night didn't help, then developed symptoms in the left hand. Reluctant of starting Gabapentin, may try Tylenol at night.

## 2019-04-24 NOTE — Patient Instructions (Addendum)
CBC/diff, CMP/eGFR, TSH, Lipid panel, Vit D prior Next appointment with Dr Lyndel Safe in 3

## 2019-04-24 NOTE — Assessment & Plan Note (Signed)
Age related, multiple sites, mild, prn Ibuprofen is available for her.

## 2019-04-24 NOTE — Assessment & Plan Note (Addendum)
Continue Vit D supplement. Update Vit D level.

## 2019-04-24 NOTE — Assessment & Plan Note (Addendum)
Taking Pravastatin, update lipid panel. LDL 95 07/2018

## 2019-04-24 NOTE — Assessment & Plan Note (Signed)
Every 2-3 hours night urination, takes naps during today to make up her rest.

## 2019-04-24 NOTE — Progress Notes (Signed)
Provider:  Marlana Latus NP Location:   clinic [place]   Place of Service:  Clinic [phone])  PCP: Elby Showers, MD Patient Care Team: Elby Showers, MD as PCP - General (Internal Medicine)  Extended Emergency Contact Information Primary Emergency Contact: Anastasia Fiedler, [place] Phone: [phone] Relation: Son  Code Status: DNR Goals of Care: Advanced Directive information Advanced Directives 09/10/2015  Does Patient Have a Medical Advance Directive? Yes  Type of Advance Directive -  [place] in Chart? No - copy requested  Pre-existing out of facility DNR order (yellow form or pink MOST form) -      Chief Complaint  Patient presents with  . New Admit To SNF    Patient here today to establish care.    HPI: Patient is a 86 y.o. female seen today for establish care with [facility] [place].  The patient has Hx of HTN, blood pressure is controlled on Lisinopril '10mg'$  qd, Metoprolol '25mg'$  qd, HCTZ 12.'5mg'$  qd. Hyperlipidemia, stable, LDL 95 07/2018, on Pravastatin '20mg'$  qd. OA, multiple sites, stable, on prn Ibuprofen, Vit D. Afib, heart rate is in control, on Metoprolol, ASA '81mg'$  qd. Hypothyroidism, TSH 2.45 07/29/18, taking Levothyroxine 58mg qd. OP, taking Vit D, no recent fx. CKD eGFR 23 07/2018.   Past Medical History:  Diagnosis Date  . Breast cancer ([place]   . Cardiac pacemaker in situ 04/01/2004   Original implant 04/01/04 Dr. TDoreatha LewIndications syncope with Mobitz 2 heart block  RV lead Guidant 4470 52 [place] [id number] lead Guidant bipolar serial [id number]  Medtronic EnPulse Model number [id number] SN PNB [id number]   . Carotid bruit 10/25/2010   right  . Diverticulosis   . H/O syncope   . 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 Hiatal hernia   . HTN (hypertension), benign   . Hyperlipidemia   . [place]an[place] of [place] ([place]  . Mobitz II   . Osteoporosis    Past Surgical History:  Procedure Laterality Date   . BREAST BIOPSY  1960   Left  . BREAST LUMPECTOMY  1960   left 2017  . BREAST LUMPECTOMY WITH RADIOACTIVE SEED LOCALIZATION Left 09/10/2015   Procedure: BREAST LUMPECTOMY WITH RADIOACTIVE SEED LOCALIZATION;  Surgeon: BExcell Seltzer MD;  [place]  Service: General;  Laterality: Left;  . CATARACT EXTRACTION, BILATERAL  2001  . INSERT / REPLACE / REMOVE PACEMAKER    . LOM HEMOTYMPANUM THROAT & SINUS BX  1977  . PACEMAKER GENERATOR CHANGE N/A 08/16/2012   Procedure: PACEMAKER GENERATOR CHANGE;  Surgeon: SDeboraha Sprang MD;  [facility];  Service: Cardiovascular;  Laterality: N/A;  . PACEMAKER INSERTION  2006   Medtronic DDDR E2DRO1, SERIAL #  [id number]  . surgery of ear      reports that she quit smoking about 73 years ago. Her smoking use included cigarettes. She has a 3.30 pack-year smoking history. She has never used smokeless tobacco. She reports current alcohol use of about 7.0 standard drinks of alcohol per week. She reports that she does not use drugs. Social History   Socioeconomic History  . Marital status: Widowed    Spouse name: Not on file  . Number of children: 3  . Years of education: Not on file  . Highest education level: Not on file  Occupational History  . Not on file  Tobacco Use  . Smoking  status: Former Smoker    Packs/day: 0.33    Years: 10.00    Pack years: 3.30    Types: Cigarettes    Quit date: 02/27/1946    Years since quitting: 73.2  . Smokeless tobacco: Never Used  Substance and Sexual Activity  . Alcohol use: Yes    Alcohol/week: 7.0 standard drinks    Types: 7 Glasses of wine per week    Comment: wine  . Drug use: No  . Sexual activity: Not on file  Other Topics Concern  . Not on file  Social History Narrative   Social History      Diet?       Do you drink/eat things with caffeine? yes      Marital status?       Widowed                              What year were you married? 1947      Do you live in a house,  apartment, assisted living, condo, trailer, etc.? Yes apartment      Is it one or more stories?       How many persons live in your home? 1       Do you have any pets in your home? (please list) no       Highest level of education completed? 4 years college       Current or past profession: Network engineer       Do you exercise?                   No                    Type & how often?      Advanced Directives      Do you have a living will? yes      Do you have a DNR form?                                  If not, do you want to discuss one?      Do you have signed POA/HPOA for forms? yes      Functional Status      Do you have difficulty bathing or dressing yourself? No       Do you have difficulty preparing food or eating? No       Do you have difficulty managing your medications? No       Do you have difficulty managing your finances? No       Do you have difficulty affording your medications? No       Social Determinants of Health   Financial Resource Strain:   . Difficulty of Paying Living Expenses: Not on file  Food Insecurity:   . Worried About Charity fundraiser in the Last Year: Not on file  . Ran Out of Food in the Last Year: Not on file  Transportation Needs:   . Lack of Transportation (Medical): Not on file  . Lack of Transportation (Non-Medical): Not on file  Physical Activity:   . Days of Exercise per Week: Not on file  . Minutes of Exercise per Session: Not on file  Stress:   . Feeling of Stress : Not on file  Social Connections:   . Frequency of Communication with Friends and  Family: Not on file  . Frequency of Social Gatherings with Friends and Family: Not on file  . Attends Religious Services: Not on file  . Active Member of Clubs or Organizations: Not on file  . Attends Archivist Meetings: Not on file  . Marital Status: Not on file  Intimate Partner Violence:   . Fear of Current or Ex-Partner: Not on file  . Emotionally Abused: Not  on file  . Physically Abused: Not on file  . Sexually Abused: Not on file    Functional Status Survey:    Family History  Problem Relation Age of Onset  . Stroke Mother 104  . Kidney disease Father 78  . Cirrhosis Son     Health Maintenance  Topic Date Due  . Samul Dada  05/01/2021  . INFLUENZA VACCINE  Completed  . PNA vac Low Risk Adult  Completed  . DEXA SCAN  Addressed    Allergies  Allergen Reactions  . Adhesive [Tape] Itching and Rash    Please use "paper" tape  . Codeine Nausea Only  . Triamterene-Hctz Other (See Comments)    fatigue  . Vasotec Diarrhea    Allergies as of 04/24/2019      Reactions   Adhesive [tape] Itching, Rash   Please use "paper" tape   Codeine Nausea Only   Triamterene-hctz Other (See Comments)   fatigue   Vasotec Diarrhea      Medication List       Accurate as of April 24, 2019 11:59 PM. If you have any questions, ask your nurse or doctor.        aspirin 81 MG tablet Take 81 mg by mouth daily.   beta carotene w/minerals tablet Take 1 tablet by mouth daily.   docusate sodium 100 MG capsule Commonly known as: COLACE Take 100 mg by mouth daily as needed for constipation.   hydrochlorothiazide 12.5 MG capsule Commonly known as: MICROZIDE TAKE (1) CAPSULE DAILY.   ibuprofen 200 MG tablet Commonly known as: ADVIL Take 200 mg by mouth as directed.   levothyroxine 50 MCG tablet Commonly known as: SYNTHROID TAKE 1 TABLET ONCE DAILY.   lisinopril 10 MG tablet Commonly known as: ZESTRIL TAKE 1 TABLET ONCE DAILY.   loperamide 2 MG tablet Commonly known as: IMODIUM A-D Take 2 mg by mouth daily as needed for diarrhea or loose stools.   metoprolol succinate 25 MG 24 hr tablet Commonly known as: TOPROL-XL TAKE 1 TABLET ONCE DAILY.   pravastatin 20 MG tablet Commonly known as: PRAVACHOL TAKE 1 TABLET EACH DAY.   Vitamin D-3 25 MCG (1000 UT) Caps Take 1,000 Units by mouth daily.       Review of Systems   Constitutional: Negative for activity change, appetite change, chills, diaphoresis, fatigue and fever.  HENT: Positive for hearing loss. Negative for congestion and voice change.        Hearing aids.   Eyes: Negative for visual disturbance.       S/p inj, macular degeneration, able to read regular print with reading glasses.   Respiratory: Negative for cough, shortness of breath and wheezing.   Cardiovascular: Positive for leg swelling. Negative for chest pain and palpitations.  Gastrointestinal: Negative for abdominal distention, abdominal pain, constipation, diarrhea, nausea and vomiting.  Genitourinary: Positive for frequency.       Every 2-3 hours at night  Musculoskeletal: Positive for arthralgias and gait problem.  Skin: Negative for color change and pallor.  Neurological: Negative for dizziness, speech difficulty,  weakness and headaches.       Tingling numbness in hands.   Psychiatric/Behavioral: Positive for sleep disturbance. Negative for agitation, behavioral problems and hallucinations. The patient is not nervous/anxious.        Due to frequent urination at night.     Vitals:   04/24/19 1359  BP: 136/68  Pulse: 70  Temp: (!) 96.6 F (35.9 C)  SpO2: 97%  Weight: 142 lb (64.4 kg)  Height: 5' 3"  (1.6 m)   Body mass index is 25.15 kg/m. Physical Exam Vitals and nursing note reviewed.  Constitutional:      General: She is not in acute distress.    Appearance: Normal appearance. She is not ill-appearing, toxic-appearing or diaphoretic.  HENT:     Head: Normocephalic and atraumatic.     Nose: Nose normal.     Mouth/Throat:     Mouth: Mucous membranes are moist.  Eyes:     Extraocular Movements: Extraocular movements intact.     Conjunctiva/sclera: Conjunctivae normal.     Pupils: Pupils are equal, round, and reactive to light.  Neck:     Vascular: No carotid bruit.  Cardiovascular:     Rate and Rhythm: Normal rate and regular rhythm.     Heart sounds: No murmur.   Pulmonary:     Breath sounds: No wheezing, rhonchi or rales.  Chest:     Chest wall: No tenderness.  Abdominal:     General: Bowel sounds are normal. There is no distension.     Palpations: Abdomen is soft.     Tenderness: There is no abdominal tenderness. There is no right CVA tenderness, left CVA tenderness, guarding or rebound.  Musculoskeletal:     Cervical back: Normal range of motion and neck supple. No rigidity or tenderness.     Right lower leg: Edema present.     Left lower leg: Edema present.     Comments: Trace edema BLE,. Ambulates with walker. Fullness left popliteal area.   Lymphadenopathy:     Cervical: No cervical adenopathy.  Skin:    General: Skin is warm and dry.     Comments: Mild chronic venous insufficiency skin changes BLE  Neurological:     General: No focal deficit present.     Mental Status: She is alert and oriented to person, place, and time. Mental status is at baseline.     Motor: No weakness.     Coordination: Coordination normal.     Gait: Gait abnormal.  Psychiatric:        Mood and Affect: Mood normal.        Behavior: Behavior normal.        Thought Content: Thought content normal.        Judgment: Judgment normal.     Labs reviewed: Basic Metabolic Panel: Recent Labs    07/29/18 1046  NA 142  K 4.8  CL 104  CO2 24  GLUCOSE 88  BUN 47*  CREATININE 1.81*  CALCIUM 9.7   Liver Function Tests: Recent Labs    07/29/18 1046  AST 28  ALT 38*  BILITOT 0.6  PROT 6.5   No results for input(s): LIPASE, AMYLASE in the last 8760 hours. No results for input(s): AMMONIA in the last 8760 hours. CBC: Recent Labs    07/29/18 1046  WBC 10.4  NEUTROABS 7,790  HGB 13.4  HCT 42.5  MCV 89.5  PLT 189   Cardiac Enzymes: No results for input(s): CKTOTAL, CKMB, CKMBINDEX, TROPONINI in the last  8760 hours. BNP: Invalid input(s): POCBNP No results found for: HGBA1C Lab Results  Component Value Date   TSH 2.45 07/29/2018   No results  found for: VITAMINB12 No results found for: FOLATE No results found for: IRON, TIBC, FERRITIN  Imaging and Procedures obtained prior to SNF admission: MM DIAG BREAST TOMO BILATERAL  Result Date: 10/10/2016 CLINICAL DATA:  86 year old female with history of left breast cancer post lumpectomy 09/10/2015. EXAM: 2D DIGITAL DIAGNOSTIC BILATERAL MAMMOGRAM WITH CAD AND ADJUNCT TOMO COMPARISON:  Previous exam(s). ACR Breast Density Category b: There are scattered areas of fibroglandular density. FINDINGS: No suspicious masses or calcifications are seen in either breast. Postsurgical changes are present in the central slightly upper left breast related to interval lumpectomy. Spot compression magnification CC view of the lumpectomy site in the left breast was performed. There is no mammographic evidence of locally recurrent malignancy. Mammographic images were processed with CAD. IMPRESSION: New lumpectomy site left breast. There are no findings of malignancy in either breast. RECOMMENDATION: Diagnostic mammogram is suggested in 1 year. (Code:DM-B-01Y) I have discussed the findings and recommendations with the patient. Results were also provided in writing at the conclusion of the visit. If applicable, a reminder letter will be sent to the patient regarding the next appointment. BI-RADS CATEGORY  2: Benign. Electronically Signed   By: Everlean Alstrom M.D.   On: 10/10/2016 10:51    Assessment/Plan  Essential hypertension Blood pressure is controlled, continue Lisinopril 43m qd, Metoprolol 217mqd, HCTZ 12.32m74md, update CMP/eGFR  PAF (paroxysmal atrial fibrillation) (HCC) Heart rate is in control, continue Metoprolol 232m31m, update TSH.  Hypothyroidism Continue Levothyroxine 50mc94m, update TSH, CBC/diff. TSH 2.45 07/2018  Osteoporosis Continue Vit D supplement. Update Vit D level.   Hyperlipidemia Taking Pravastatin, update lipid panel. LDL 95 07/2018  Osteoarthritis Age related, multiple sites,  mild, prn Ibuprofen is available for her.   Kidney disease, chronic, stage IV (severe, EGFR 15-29 ml/min) (HCC) eGFR 23 07/2018, update CMP/eGFR  Neuropathy Tingling, numbness in fingers/hand, worse at night or holding book too long, it was thought to be carpal tunnel syndrome of the right, splint at night didn't help, then developed symptoms in the left hand. Reluctant of starting Gabapentin, may try Tylenol at night.   Urinary frequency Every 2-3 hours night urination, takes naps during today to make up her rest.   Baker's cyst, left ? Fullness noted in the left popliteal area. No pain  Family/ staff Communication: plan of care reviewed with the patient and charge nurse.   Labs/tests ordered: CBC/diff, CMP/eGFR, TSH, Lipid panel, Vit D  Next appointment with Dr GuptaLyndel Safe months.

## 2019-04-24 NOTE — Assessment & Plan Note (Signed)
Blood pressure is controlled, continue Lisinopril 72m qd, Metoprolol 223mqd, HCTZ 12.96m60md, update CMP/eGFR

## 2019-04-25 ENCOUNTER — Encounter: Payer: Self-pay | Admitting: Nurse Practitioner

## 2019-04-28 ENCOUNTER — Other Ambulatory Visit: Payer: Self-pay | Admitting: Internal Medicine

## 2019-05-19 ENCOUNTER — Other Ambulatory Visit: Payer: Self-pay | Admitting: Internal Medicine

## 2019-05-19 ENCOUNTER — Other Ambulatory Visit: Payer: Self-pay | Admitting: Nurse Practitioner

## 2019-06-05 ENCOUNTER — Other Ambulatory Visit: Payer: Self-pay | Admitting: Internal Medicine

## 2019-06-06 ENCOUNTER — Other Ambulatory Visit: Payer: Self-pay | Admitting: Internal Medicine

## 2019-06-11 ENCOUNTER — Ambulatory Visit (INDEPENDENT_AMBULATORY_CARE_PROVIDER_SITE_OTHER): Payer: Medicare Other | Admitting: *Deleted

## 2019-06-11 DIAGNOSIS — I480 Paroxysmal atrial fibrillation: Secondary | ICD-10-CM

## 2019-06-12 ENCOUNTER — Ambulatory Visit (INDEPENDENT_AMBULATORY_CARE_PROVIDER_SITE_OTHER): Payer: Medicare Other | Admitting: Ophthalmology

## 2019-06-12 ENCOUNTER — Other Ambulatory Visit: Payer: Self-pay

## 2019-06-12 ENCOUNTER — Encounter (INDEPENDENT_AMBULATORY_CARE_PROVIDER_SITE_OTHER): Payer: Self-pay | Admitting: Ophthalmology

## 2019-06-12 DIAGNOSIS — H353111 Nonexudative age-related macular degeneration, right eye, early dry stage: Secondary | ICD-10-CM

## 2019-06-12 DIAGNOSIS — H353222 Exudative age-related macular degeneration, left eye, with inactive choroidal neovascularization: Secondary | ICD-10-CM | POA: Insufficient documentation

## 2019-06-12 DIAGNOSIS — H353211 Exudative age-related macular degeneration, right eye, with active choroidal neovascularization: Secondary | ICD-10-CM

## 2019-06-12 MED ORDER — AFLIBERCEPT 2MG/0.05ML IZ SOLN FOR KALEIDOSCOPE
2.0000 mg | INTRAVITREAL | Status: AC | PRN
  Administered 2019-06-12: 12:00:00 2 mg via INTRAVITREAL

## 2019-06-12 NOTE — Progress Notes (Signed)
06/12/2019     CHIEF COMPLAINT Patient presents for Retina Follow Up   HISTORY OF PRESENT ILLNESS: [name] [name] is a 86 y.o. female who presents to the clinic today for:   HPI    Retina Follow Up    Patient presents with  Wet AMD.  In right eye.  Severity is moderate.  Since onset it is stable.  I, the attending physician,  performed the HPI with the patient and updated documentation appropriately.          Comments    8 Week AMD f\u OD. Possible Eylea OD. OCT  Pt states vision is stable. PT sees occasional floaters, denies FOL.       Last edited by Tilda Franco on 06/12/2019 10:40 AM. (History)      Referring physician: Elby Showers, MD [address] [place],  [place] [address]  HISTORICAL INFORMATION:   Selected notes from the [facility]: No current outpatient medications on file. (Ophthalmic Drugs)   No current facility-administered medications for this visit. (Ophthalmic Drugs)   Current Outpatient Medications (Other)  Medication Sig  . aspirin 81 MG tablet Take 81 mg by mouth daily.   . beta carotene w/minerals (OCUVITE) tablet Take 1 tablet by mouth daily.  . Cholecalciferol (VITAMIN D-3) 1000 units CAPS Take 1,000 Units by mouth daily.  Marland Kitchen docusate sodium (COLACE) 100 MG capsule Take 100 mg by mouth daily as needed for constipation.  . hydrochlorothiazide (MICROZIDE) 12.5 MG capsule TAKE (1) CAPSULE DAILY.  Marland Kitchen ibuprofen (ADVIL,MOTRIN) 200 MG tablet Take 200 mg by mouth as directed.  Marland Kitchen levothyroxine (SYNTHROID) 50 MCG tablet TAKE 1 TABLET ONCE DAILY.  Marland Kitchen lisinopril (ZESTRIL) 10 MG tablet TAKE 1 TABLET ONCE DAILY.  Marland Kitchen loperamide (IMODIUM A-D) 2 MG tablet Take 2 mg by mouth daily as needed for diarrhea or loose stools.  . metoprolol succinate (TOPROL-XL) 25 MG 24 hr tablet TAKE 1 TABLET ONCE DAILY.  . pravastatin (PRAVACHOL) 20 MG tablet TAKE 1 TABLET EACH DAY.   No current facility-administered medications  for this visit. (Other)      REVIEW OF SYSTEMS:    ALLERGIES Allergies  Allergen Reactions  . Adhesive [Tape] Itching and Rash    Please use "paper" tape  . Codeine Nausea Only  . Triamterene-Hctz Other (See Comments)    fatigue  . Vasotec Diarrhea    PAST MEDICAL HISTORY Past Medical History:  Diagnosis Date  . Breast cancer ([place])   . Cardiac pacemaker in situ 04/01/2004   Original implant 04/01/04 Dr. Doreatha Lew Indications syncope with Mobitz 2 heart block  RV lead Guidant 4470 52 cm bipolar lead, SN [id number] atrial lead Guidant bipolar serial #[id number].  Medtronic EnPulse Model number [id number], SN PNB [id number]   . Carotid bruit 10/25/2010   right  . Diverticulosis   . H/O syncope   . 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 Hiatal hernia   . HTN (hypertension), benign   . Hyperlipidemia   . Melanoma of back ([place]) 2009  . Mobitz II   . Osteoporosis    Past Surgical History:  Procedure Laterality Date  . BREAST BIOPSY  1960   Left  . BREAST LUMPECTOMY  1960   left 2017  . BREAST LUMPECTOMY WITH RADIOACTIVE SEED LOCALIZATION Left 09/10/2015   Procedure: BREAST LUMPECTOMY WITH RADIOACTIVE SEED LOCALIZATION;  Surgeon: Excell Seltzer, MD;  Location: [facility];  Service: General;  Laterality: Left;  .  CATARACT EXTRACTION Bilateral 2002   Dr. Katy Fitch  . CATARACT EXTRACTION, BILATERAL  2001  . INSERT / REPLACE / REMOVE PACEMAKER    . LOM HEMOTYMPANUM THROAT & SINUS BX  1977  . PACEMAKER GENERATOR CHANGE N/A 08/16/2012   Procedure: PACEMAKER GENERATOR CHANGE;  Surgeon: Deboraha Sprang, MD;  Location: [facility] CATH LAB;  Service: Cardiovascular;  Laterality: N/A;  . PACEMAKER INSERTION  2006   Medtronic DDDR [id number], SERIAL #  [id number]  . surgery of ear      FAMILY HISTORY Family History  Problem Relation Age of Onset  . Stroke Mother 36  . Kidney disease Father 98  . Cirrhosis Son     SOCIAL HISTORY Social History   Tobacco Use  . Smoking status: Former Smoker     Packs/day: 0.33    Years: 10.00    Pack years: 3.30    Types: Cigarettes    Quit date: 02/27/1946    Years since quitting: 73.3  . Smokeless tobacco: Never Used  Substance Use Topics  . Alcohol use: Yes    Alcohol/week: 7.0 standard drinks    Types: 7 Glasses of wine per week    Comment: wine  . Drug use: No         OPHTHALMIC EXAM:  Base Eye Exam    Visual Acuity (Snellen - Linear)      Right Left   Dist cc 20/50 + CF @ 1'   Dist ph cc NI NI   Correction: Glasses       Tonometry (Tonopen, 10:45 AM)      Right Left   Pressure 13 15       Pupils      Pupils Dark Light Shape React APD   Right PERRL 4 4 Round Minimal None   Left PERRL 4 4 Round Minimal None       Visual Fields (Counting fingers)      Left Right    Full Full       Neuro/Psych    Oriented x3: Yes   Mood/Affect: Normal       Dilation    Right eye: 1.0% Mydriacyl, 2.5% Phenylephrine @ 10:45 AM        Slit Lamp and Fundus Exam    External Exam      Right Left   External Normal Normal       Slit Lamp Exam      Right Left   Lids/Lashes Normal    Conjunctiva/Sclera White and quiet    Cornea Clear    Anterior Chamber Deep and quiet    Iris Round and reactive    Lens Posterior chamber intraocular lens    Anterior Vitreous Normal        Fundus Exam      Right Left   Posterior Vitreous Posterior vitreous detachment    Disc Normal    C/D Ratio 0.4    Macula Atrophy, Retinal pigment epithelial atrophy, Age related macular degeneration, Early age related macular degeneration, Drusen, no exudates, no hemorrhage, no macular thickening    Vessels Normal    Periphery Normal           IMAGING AND PROCEDURES  Imaging and Procedures for @TODAY @  OCT, Retina - OU - Both Eyes       Right Eye Quality was good. Scan locations included subfoveal. Central Foveal Thickness: 276. Progression has improved. Findings include abnormal foveal contour, retinal drusen , outer retinal atrophy.    Left Eye  Central Foveal Thickness: 322. Progression has been stable. Findings include abnormal foveal contour, outer retinal atrophy, preretinal fibrosis.   Notes Much improved on Eylea right eye after change of winter 20/20, much less subretinal fluid  Repeat Eylea today and again in 2 months exam OD                ASSESSMENT/PLAN:  No problem-specific Assessment & Plan notes found for this encounter.      ICD-10-CM   1. Exudative age-related macular degeneration of right eye with active choroidal neovascularization (HCC)  H35.3211 OCT, Retina - OU - Both Eyes  2. Exudative age-related macular degeneration of left eye with inactive choroidal neovascularization (HCC)  H35.3222 OCT, Retina - OU - Both Eyes  3. 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  2.  3.  Ophthalmic Meds Ordered this visit:  No orders of the defined types were placed in this encounter.      Return in about 8 weeks (around 08/07/2019) for dilate, OD, EYLEA OCT.  There are no Patient Instructions on file for this visit.   Explained the diagnoses, plan, and follow up with the patient and they expressed understanding.  Patient expressed understanding of the importance of proper follow up care.   Clent Demark Ladaisha Portillo M.D. Diseases & Surgery of the Retina and Vitreous Retina & Diabetic [place] @TODAY @     Abbreviations: M myopia (nearsighted); A astigmatism; H hyperopia (farsighted); P presbyopia; Mrx spectacle prescription;  CTL contact lenses; OD right eye; OS left eye; OU both eyes  XT exotropia; ET esotropia; PEK punctate epithelial keratitis; PEE punctate epithelial erosions; DES dry eye syndrome; MGD meibomian gland dysfunction; ATs artificial tears; PFAT's preservative free artificial tears; [place] nuclear sclerotic cataract; PSC posterior subcapsular cataract; ERM epi-retinal membrane; PVD posterior vitreous detachment; RD retinal detachment; DM  diabetes mellitus; DR diabetic retinopathy; NPDR non-proliferative diabetic retinopathy; PDR proliferative diabetic retinopathy; CSME clinically significant macular edema; DME diabetic macular edema; dbh dot blot hemorrhages; CWS cotton wool spot; POAG primary open angle glaucoma; C/D cup-to-disc ratio; HVF humphrey visual field; GVF goldmann visual field; OCT optical coherence tomography; IOP intraocular pressure; BRVO Branch retinal vein occlusion; CRVO central retinal vein occlusion; CRAO central retinal artery occlusion; BRAO branch retinal artery occlusion; RT retinal tear; SB scleral buckle; PPV pars plana vitrectomy; VH Vitreous hemorrhage; PRP panretinal laser photocoagulation; IVK intravitreal kenalog; VMT vitreomacular traction; MH Macular hole;  NVD neovascularization of the disc; NVE neovascularization elsewhere; AREDS age related eye disease study; ARMD age related macular degeneration; POAG primary open angle glaucoma; EBMD epithelial/anterior basement membrane dystrophy; ACIOL anterior chamber intraocular lens; IOL intraocular lens; PCIOL posterior chamber intraocular lens; Phaco/IOL phacoemulsification with intraocular lens placement; [place] photorefractive keratectomy; LASIK laser assisted in situ keratomileusis; HTN hypertension; DM diabetes mellitus; COPD chronic obstructive pulmonary disease

## 2019-06-13 LAB — CUP PACEART REMOTE DEVICE CHECK
Battery Impedance: 1825 Ohm
Battery Remaining Longevity: 32 mo
Battery Voltage: 2.76 V
Brady Statistic AP VP Percent: 19 %
Brady Statistic AP VS Percent: 0 %
Brady Statistic AS VP Percent: 81 %
Brady Statistic AS VS Percent: 0 %
Date Time Interrogation Session: 20210416113720
Implantable Lead Implant Date: 20060203
Implantable Lead Implant Date: 20060203
Implantable Lead Location: 753859
Implantable Lead Location: 753860
Implantable Lead Model: 4469
Implantable Lead Model: 4470
Implantable Lead Serial Number: 458590
Implantable Lead Serial Number: 500613
Implantable Pulse Generator Implant Date: 20140620
Lead Channel Impedance Value: 417 Ohm
Lead Channel Impedance Value: 519 Ohm
Lead Channel Pacing Threshold Amplitude: 1 V
Lead Channel Pacing Threshold Amplitude: 1.125 V
Lead Channel Pacing Threshold Pulse Width: 0.4 ms
Lead Channel Pacing Threshold Pulse Width: 0.4 ms
Lead Channel Setting Pacing Amplitude: 2.25 V
Lead Channel Setting Pacing Amplitude: 2.5 V
Lead Channel Setting Pacing Pulse Width: 0.4 ms
Lead Channel Setting Sensing Sensitivity: 2.8 mV

## 2019-06-13 NOTE — Progress Notes (Signed)
PPM Remote  

## 2019-07-10 ENCOUNTER — Other Ambulatory Visit: Payer: Self-pay

## 2019-07-10 ENCOUNTER — Encounter: Payer: Self-pay | Admitting: Nurse Practitioner

## 2019-07-10 ENCOUNTER — Non-Acute Institutional Stay: Payer: Medicare Other | Admitting: Nurse Practitioner

## 2019-07-10 DIAGNOSIS — L989 Disorder of the skin and subcutaneous tissue, unspecified: Secondary | ICD-10-CM | POA: Diagnosis not present

## 2019-07-10 DIAGNOSIS — H01005 Unspecified blepharitis left lower eyelid: Secondary | ICD-10-CM | POA: Diagnosis not present

## 2019-07-10 DIAGNOSIS — I10 Essential (primary) hypertension: Secondary | ICD-10-CM

## 2019-07-10 DIAGNOSIS — H01002 Unspecified blepharitis right lower eyelid: Secondary | ICD-10-CM | POA: Insufficient documentation

## 2019-07-10 DIAGNOSIS — L03115 Cellulitis of right lower limb: Secondary | ICD-10-CM | POA: Diagnosis not present

## 2019-07-10 DIAGNOSIS — E039 Hypothyroidism, unspecified: Secondary | ICD-10-CM | POA: Diagnosis not present

## 2019-07-10 MED ORDER — DOXYCYCLINE HYCLATE 100 MG PO TABS: 100.0000 mg | ORAL_TABLET | Freq: Two times a day (BID) | ORAL | 0 refills | Status: AC

## 2019-07-10 NOTE — Assessment & Plan Note (Addendum)
Mild ectropion, crusted eyelashes, denied eye pain, irritation, or itching, no change of vision, will warm compress am and pm, uses baby shampoo cleanse eyelids, prn artificial tears. observe

## 2019-07-10 NOTE — Assessment & Plan Note (Signed)
In the right lower leg surrounded the skin lesion about a palm sized area redness, warmth, swelling, tender when palpated, will treat with Doxycycline 100mg  bid x 7 days. Observe.

## 2019-07-10 NOTE — Assessment & Plan Note (Signed)
Stable, pending TSH, continue Levothyroxine 69mcg qd.

## 2019-07-10 NOTE — Assessment & Plan Note (Signed)
Mid lateral lower leg, a marble sized skin lesion noted, f/u Dermatology.

## 2019-07-10 NOTE — Assessment & Plan Note (Signed)
Blood pressure is controlled, continue Lisinopril, Metoprolol, HCTZ

## 2019-07-10 NOTE — Progress Notes (Signed)
Location:      Place of Service:  Clinic (12) Provider: Marlana Latus NP  Code Status: DNR Goals of Care:  Advanced Directives 09/10/2015  Does Patient Have a Medical Advance Directive? Yes  Type of Advance Directive -  [place] in Chart? No - copy requested  Pre-existing out of facility DNR order (yellow form or pink MOST form) -     Chief Complaint  Patient presents with  . Acute Visit    Bilateral eye redness. Spot on right leg for the last two weeks.     HPI: Patient is a 86 y.o. female seen today for an acute visit for red eyes.   Hx of HTN, blood pressure is controlled on HCTZ 12.'5mg'$  qd, Lisinopril '10mg'$  qd, Metoprolol '25mg'$  qd. Hypothyroidism, stable, on Levothyroxine 23mg qd. (pending prior to appointment 07/22/19 with Dr. GLyndel Safe CBC/diff, CMP/eGFR, TSH, Lipid panel, Vit D ordered 04/24/19)    Past Medical History:  Diagnosis Date  . [place]   . Cardiac pacemaker in situ 04/01/2004   Original implant 04/01/04 Dr. TDoreatha LewIndications syncope with Mobitz 2 heart block  RV lead Guidant 4470 52 [place] [id number] lead Guidant bipolar serial ##[id number]  Medtronic EnPulse Model number [id number] SN PNB [id number]   . Carotid bruit 10/25/2010   right  . Diverticulosis   . H/O syncope   . 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 Hiatal hernia   . HTN (hypertension), benign   . Hyperlipidemia   . [place]anoma of back ([place] 2009  . Mobitz II   . Osteoporosis     Past Surgical History:  Procedure Laterality Date  . BREAST BIOPSY  1960   Left  . BREAST LUMPECTOMY  1960   left 2017  . BREAST LUMPECTOMY WITH RADIOACTIVE SEED LOCALIZATION Left 09/10/2015   Procedure: BREAST LUMPECTOMY WITH RADIOACTIVE SEED LOCALIZATION;  Surgeon: BExcell Seltzer MD;  [place] [facility]  Service: General;  Laterality: Left;  . CATARACT EXTRACTION Bilateral 2002   Dr. GKaty Fitch . CATARACT EXTRACTION, BILATERAL  2001  . INSERT / REPLACE / REMOVE  PACEMAKER    . LOM HEMOTYMPANUM THROAT & SINUS BX  1977  . PACEMAKER GENERATOR CHANGE N/A 08/16/2012   Procedure: PACEMAKER GENERATOR CHANGE;  Surgeon: SDeboraha Sprang MD;  [facility];  Service: Cardiovascular;  Laterality: N/A;  . PACEMAKER INSERTION  2006   Medtronic DDDR E2DRO1, SERIAL #  [id number]  . surgery of ear      Allergies  Allergen Reactions  . Adhesive [Tape] Itching and Rash    Please use "paper" tape  . Codeine Nausea Only  . Triamterene-Hctz Other (See Comments)    fatigue  . Vasotec Diarrhea    Allergies as of 07/10/2019      Reactions   Adhesive [tape] Itching, Rash   Please use "paper" tape   Codeine Nausea Only   Triamterene-hctz Other (See Comments)   fatigue   Vasotec Diarrhea      Medication List       Accurate as of Jul 10, 2019  2:44 PM. If you have any questions, ask your nurse or doctor.        STOP taking these medications   docusate sodium 100 MG capsule Commonly known as: COLACE Stopped by: Jazlen Ogarro X Nandita Mathenia, NP     TAKE these medications   aspirin 81 MG tablet Take 81 mg by mouth daily.   beta carotene w/minerals  tablet Take 1 tablet by mouth daily.   doxycycline 100 MG tablet Commonly known as: VIBRA-TABS Take 1 tablet (100 mg total) by mouth 2 (two) times daily for 7 days. Started by: Jalon Blackwelder X Amir Fick, NP   hydrochlorothiazide 12.5 MG capsule Commonly known as: MICROZIDE TAKE (1) CAPSULE DAILY.   ibuprofen 200 MG tablet Commonly known as: ADVIL Take 200 mg by mouth as directed.   levothyroxine 50 MCG tablet Commonly known as: SYNTHROID TAKE 1 TABLET ONCE DAILY.   lisinopril 10 MG tablet Commonly known as: ZESTRIL TAKE 1 TABLET ONCE DAILY.   loperamide 2 MG tablet Commonly known as: IMODIUM A-D Take 2 mg by mouth daily as needed for diarrhea or loose stools.   metoprolol succinate 25 MG 24 hr tablet Commonly known as: TOPROL-XL TAKE 1 TABLET ONCE DAILY.   pravastatin 20 MG tablet Commonly known as: PRAVACHOL  TAKE 1 TABLET EACH DAY.   Vitamin D-3 25 MCG (1000 UT) Caps Take 1,000 Units by mouth daily.       Review of Systems:  Review of Systems  Constitutional: Negative for activity change, appetite change, fatigue and fever.  HENT: Positive for hearing loss. Negative for congestion and voice change.        Hearing aids.   Eyes: Positive for discharge. Negative for pain, redness, itching and visual disturbance.       S/p inj, macular degeneration, able to read regular print with reading glasses.   Respiratory: Negative for cough, shortness of breath and wheezing.   Cardiovascular: Positive for leg swelling. Negative for chest pain and palpitations.       RLE edema  Gastrointestinal: Negative for abdominal distention, abdominal pain and constipation.  Genitourinary: Positive for frequency.       Every 2-3 hours at night  Musculoskeletal: Positive for arthralgias and gait problem.  Skin:       Lateral mid right lower leg skin lesion, redness, warmth, tenderness, swelling surrounded skin lesion  Neurological: Negative for dizziness, speech difficulty, weakness and headaches.       Tingling numbness in hands.   Psychiatric/Behavioral: Positive for sleep disturbance. Negative for agitation and behavioral problems.       Due to frequent urination at night.     Health Maintenance  Topic Date Due  . INFLUENZA VACCINE  09/28/2019  . TETANUS/TDAP  05/01/2021  . COVID-19 Vaccine  Completed  . PNA vac Low Risk Adult  Completed  . DEXA SCAN  Addressed    Physical Exam: Vitals:   07/10/19 1359  BP: 136/68  Pulse: 94  Temp: (!) 97.3 F (36.3 C)  SpO2: 96%  Weight: 142 lb 9.6 oz (64.7 kg)  Height: 5' 3"  (1.6 m)   Body mass index is 25.26 kg/m. Physical Exam Vitals and nursing note reviewed.  Constitutional:      Appearance: Normal appearance.  HENT:     Head: Normocephalic and atraumatic.     Nose: Nose normal.     Mouth/Throat:     Mouth: Mucous membranes are moist.  Eyes:      Extraocular Movements: Extraocular movements intact.     Conjunctiva/sclera: Conjunctivae normal.     Pupils: Pupils are equal, round, and reactive to light.     Comments: Mild ectropion R+L lower eyelids, crusted eyelashes, no conjunctival redness or pain in eyes.    Cardiovascular:     Rate and Rhythm: Normal rate and regular rhythm.  Pulmonary:     Breath sounds: No rales.  Abdominal:  General: Bowel sounds are normal.     Palpations: Abdomen is soft.     Tenderness: There is no abdominal tenderness.  Musculoskeletal:     Cervical back: Normal range of motion and neck supple.     Right lower leg: Edema present.     Left lower leg: No edema.     Comments: Trace edema RLE.  Ambulates with walker. Fullness left popliteal area.   Skin:    General: Skin is warm and dry.     Comments: Mild chronic venous insufficiency skin changes BLE. Lateral mid right lower leg skin lesion, redness, warmth, tenderness, swelling surrounded skin lesion about the patient's palm size.   Neurological:     General: No focal deficit present.     Mental Status: She is alert and oriented to person, place, and time. Mental status is at baseline.     Motor: No weakness.     Coordination: Coordination normal.     Gait: Gait abnormal.  Psychiatric:        Mood and Affect: Mood normal.        Behavior: Behavior normal.        Thought Content: Thought content normal.        Judgment: Judgment normal.     Labs reviewed: Basic Metabolic Panel: Recent Labs    07/29/18 1046  NA 142  K 4.8  CL 104  CO2 24  GLUCOSE 88  BUN 47*  CREATININE 1.81*  CALCIUM 9.7  TSH 2.45   Liver Function Tests: Recent Labs    07/29/18 1046  AST 28  ALT 38*  BILITOT 0.6  PROT 6.5   No results for input(s): LIPASE, AMYLASE in the last 8760 hours. No results for input(s): AMMONIA in the last 8760 hours. CBC: Recent Labs    07/29/18 1046  WBC 10.4  NEUTROABS 7,790  HGB 13.4  HCT 42.5  MCV 89.5  PLT 189    Lipid Panel: Recent Labs    07/29/18 1046  CHOL 178  HDL 62  LDLCALC 95  TRIG 118  CHOLHDL 2.9   No results found for: HGBA1C  Procedures since last visit: CUP PACEART REMOTE DEVICE CHECK  Result Date: 06/13/2019 Scheduled remote reviewed. Normal device function.  927 mode switches (0.2%), longest 8 hours 2 minutes. 3 VHR (longest 11 beats, fastest 208 bpm). Next remote 91 days. Felisa Bonier, RN, MSN  Intravitreal Injection, Pharmacologic Agent - OD - Right Eye  Result Date: 06/12/2019 Time Out 06/12/2019. 12:11 PM. Confirmed correct patient, procedure, site, and patient consented. Anesthesia Topical anesthesia was used. Anesthetic medications included Akten 3.5%. Procedure Preparation included Ofloxacin , 5% betadine to ocular surface, 10% betadine to eyelids. A 30 gauge needle was used. Injection: 2 mg aflibercept Alfonse Flavors) SOLN   NDC: [id number], Lot: [id number]   Route: Intravitreal, Site: Right Eye, Waste: 0 mg Post-op Post injection exam found visual acuity of at least counting fingers. The patient tolerated the procedure well. There were no complications. The patient received written and verbal post procedure care education. Post injection medications were not given.   OCT, Retina - OU - Both Eyes  Result Date: 06/12/2019 Right Eye Quality was good. Scan locations included subfoveal. Central Foveal Thickness: 276. Progression has improved. Findings include abnormal foveal contour, retinal drusen , outer retinal atrophy. Left Eye Central Foveal Thickness: 322. Progression has been stable. Findings include abnormal foveal contour, outer retinal atrophy, preretinal fibrosis. Notes Much improved on Eylea right eye after change of winter 20/20,  much less subretinal fluid Repeat Eylea today and again in 2 months exam OD   Assessment/Plan Blepharitis of lower eyelids of both eyes Mild ectropion, crusted eyelashes, denied eye pain, irritation, or itching, no change of vision, will warm  compress am and pm, uses baby shampoo cleanse eyelids, prn artificial tears. observe  Hypothyroidism Stable, pending TSH, continue Levothyroxine 29mg qd.   Essential hypertension Blood pressure is controlled, continue Lisinopril, Metoprolol, HCTZ  Leg skin lesion, right Mid lateral lower leg, a marble sized skin lesion noted, f/u Dermatology.   Cellulitis of right lower leg In the right lower leg surrounded the skin lesion about a palm sized area redness, warmth, swelling, tender when palpated, will treat with Doxycycline 1029mbid x 7 days. Observe.     Labs/tests ordered: none  Next appt:  07/22/2019

## 2019-07-18 DIAGNOSIS — C44722 Squamous cell carcinoma of skin of right lower limb, including hip: Secondary | ICD-10-CM | POA: Diagnosis not present

## 2019-07-18 DIAGNOSIS — Z1283 Encounter for screening for malignant neoplasm of skin: Secondary | ICD-10-CM | POA: Diagnosis not present

## 2019-07-22 ENCOUNTER — Other Ambulatory Visit: Payer: Self-pay

## 2019-07-22 DIAGNOSIS — N184 Chronic kidney disease, stage 4 (severe): Secondary | ICD-10-CM | POA: Diagnosis not present

## 2019-07-22 DIAGNOSIS — E039 Hypothyroidism, unspecified: Secondary | ICD-10-CM

## 2019-07-22 DIAGNOSIS — M810 Age-related osteoporosis without current pathological fracture: Secondary | ICD-10-CM

## 2019-07-22 DIAGNOSIS — E7800 Pure hypercholesterolemia, unspecified: Secondary | ICD-10-CM | POA: Diagnosis not present

## 2019-07-25 ENCOUNTER — Encounter: Payer: Self-pay | Admitting: Internal Medicine

## 2019-07-25 ENCOUNTER — Non-Acute Institutional Stay: Payer: Medicare Other | Admitting: Internal Medicine

## 2019-07-25 ENCOUNTER — Other Ambulatory Visit: Payer: Self-pay

## 2019-07-25 VITALS — BP 166/80 | HR 80 | Temp 96.6°F | Ht 63.0 in | Wt 137.2 lb

## 2019-07-25 DIAGNOSIS — N1832 Chronic kidney disease, stage 3b: Secondary | ICD-10-CM

## 2019-07-25 DIAGNOSIS — I10 Essential (primary) hypertension: Secondary | ICD-10-CM | POA: Diagnosis not present

## 2019-07-25 DIAGNOSIS — I480 Paroxysmal atrial fibrillation: Secondary | ICD-10-CM

## 2019-07-25 DIAGNOSIS — E7800 Pure hypercholesterolemia, unspecified: Secondary | ICD-10-CM

## 2019-07-25 DIAGNOSIS — T8149XA Infection following a procedure, other surgical site, initial encounter: Secondary | ICD-10-CM | POA: Diagnosis not present

## 2019-07-25 DIAGNOSIS — M159 Polyosteoarthritis, unspecified: Secondary | ICD-10-CM

## 2019-07-25 DIAGNOSIS — M8949 Other hypertrophic osteoarthropathy, multiple sites: Secondary | ICD-10-CM

## 2019-07-25 LAB — CBC WITH DIFFERENTIAL/PLATELET
Absolute Monocytes: 1045 cells/uL — ABNORMAL HIGH (ref 200–950)
Basophils Absolute: 55 cells/uL (ref 0–200)
Basophils Relative: 0.5 %
Eosinophils Absolute: 99 cells/uL (ref 15–500)
Eosinophils Relative: 0.9 %
HCT: 42.5 % (ref 35.0–45.0)
Hemoglobin: 13.7 g/dL (ref 11.7–15.5)
Lymphs Abs: 3377 cells/uL (ref 850–3900)
MCH: 29.5 pg (ref 27.0–33.0)
MCHC: 32.2 g/dL (ref 32.0–36.0)
MCV: 91.4 fL (ref 80.0–100.0)
MPV: 12.7 fL — ABNORMAL HIGH (ref 7.5–12.5)
Monocytes Relative: 9.5 %
Neutro Abs: 6424 cells/uL (ref 1500–7800)
Neutrophils Relative %: 58.4 %
Platelets: 226 10*3/uL (ref 140–400)
RBC: 4.65 10*6/uL (ref 3.80–5.10)
RDW: 12.8 % (ref 11.0–15.0)
Total Lymphocyte: 30.7 %
WBC: 11 10*3/uL — ABNORMAL HIGH (ref 3.8–10.8)

## 2019-07-25 LAB — COMPLETE METABOLIC PANEL WITH GFR
AG Ratio: 1.9 (calc) (ref 1.0–2.5)
ALT: 10 U/L (ref 6–29)
AST: 13 U/L (ref 10–35)
Albumin: 3.7 g/dL (ref 3.6–5.1)
Alkaline phosphatase (APISO): 88 U/L (ref 37–153)
BUN/Creatinine Ratio: 26 (calc) — ABNORMAL HIGH (ref 6–22)
BUN: 34 mg/dL — ABNORMAL HIGH (ref 7–25)
CO2: 26 mmol/L (ref 20–32)
Calcium: 8.9 mg/dL (ref 8.6–10.4)
Chloride: 103 mmol/L (ref 98–110)
Creat: 1.3 mg/dL — ABNORMAL HIGH (ref 0.60–0.88)
GFR, Est African American: 39 mL/min/{1.73_m2} — ABNORMAL LOW (ref >=60)
GFR, Est Non African American: 33 mL/min/{1.73_m2} — ABNORMAL LOW (ref >=60)
Globulin: 2 g/dL (calc) (ref 1.9–3.7)
Glucose, Bld: 103 mg/dL — ABNORMAL HIGH (ref 65–99)
Potassium: 3.7 mmol/L (ref 3.5–5.3)
Sodium: 139 mmol/L (ref 135–146)
Total Bilirubin: 0.9 mg/dL (ref 0.2–1.2)
Total Protein: 5.7 g/dL — ABNORMAL LOW (ref 6.1–8.1)

## 2019-07-25 LAB — TSH: TSH: 0.84 mIU/L (ref 0.40–4.50)

## 2019-07-25 LAB — LIPID PANEL
Cholesterol: 143 mg/dL (ref <200)
HDL: 62 mg/dL (ref >=50)
LDL Cholesterol (Calc): 64 mg/dL (calc)
Non-HDL Cholesterol (Calc): 81 mg/dL (calc) (ref <130)
Total CHOL/HDL Ratio: 2.3 (calc) (ref <5.0)
Triglycerides: 91 mg/dL (ref <150)

## 2019-07-25 LAB — VITAMIN D 1,25 DIHYDROXY
Vitamin D 1, 25 (OH)2 Total: 32 pg/mL (ref 18–72)
Vitamin D2 1, 25 (OH)2: <8 pg/mL
Vitamin D3 1, 25 (OH)2: 32 pg/mL

## 2019-07-25 MED ORDER — DOXYCYCLINE HYCLATE 100 MG PO TABS: 100.0000 mg | ORAL_TABLET | Freq: Two times a day (BID) | ORAL | 0 refills | Status: AC

## 2019-07-25 NOTE — Progress Notes (Signed)
Location: [place] of Service:  Clinic (12)  Provider:   Code Status:  Goals of Care:  Advanced Directives 09/10/2015  Does Patient Have a Medical Advance Directive? Yes  Type of Advance Directive -  [place] in Chart? No - copy requested  Pre-existing out of facility DNR order (yellow form or pink MOST form) -     Chief Complaint  Patient presents with  . Medical Management of Chronic Issues    3 month follow up. She would like to discuss her right leg wound drainage.    HPI: Patient is a 86 y.o. female seen today for Follow up  Right leg wound Patient had excision of squamous cell carcinoma from her right leg.  Since then it has been draining.  Facility nurse has been changing her dressing.  Patient had some redness around the wound and her dressing was wet again with thick discharge. Patient has mild pain but no fever or chills. Hypertension Blood pressure mildly elevated today History of A. Fib Not on any anticoagulation due to risk of falls,Age and Low Burden S/P Pacer  CKD Mildly improved creatinine Arthritis takes as needed Tylenol   Past Medical History:  Diagnosis Date  . Breast cancer ([place])   . Cardiac pacemaker in situ 04/01/2004   Original implant 04/01/04 Dr. Doreatha Lew Indications syncope with Mobitz 2 heart block  RV lead Guidant 4470 52 cm bipolar lead, SN [id number] atrial lead Guidant bipolar serial #[id number].  Medtronic EnPulse Model number [id number], SN PNB [id number]   . Carotid bruit 10/25/2010   right  . Diverticulosis   . H/O syncope   . 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 Hiatal hernia   . HTN (hypertension), benign   . Hyperlipidemia   . Melanoma of back ([place]) 2009  . Mobitz II   . Osteoporosis     Past Surgical History:  Procedure Laterality Date  . BREAST BIOPSY  1960   Left  . BREAST LUMPECTOMY  1960   left 2017  . BREAST LUMPECTOMY WITH RADIOACTIVE SEED LOCALIZATION Left 09/10/2015     Procedure: BREAST LUMPECTOMY WITH RADIOACTIVE SEED LOCALIZATION;  Surgeon: Excell Seltzer, MD;  Location: [place];  Service: General;  Laterality: Left;  . CATARACT EXTRACTION Bilateral 2002   Dr. Katy Fitch  . CATARACT EXTRACTION, BILATERAL  2001  . INSERT / REPLACE / REMOVE PACEMAKER    . LOM HEMOTYMPANUM THROAT & SINUS BX  1977  . PACEMAKER GENERATOR CHANGE N/A 08/16/2012   Procedure: PACEMAKER GENERATOR CHANGE;  Surgeon: Deboraha Sprang, MD;  Location: [facility] CATH LAB;  Service: Cardiovascular;  Laterality: N/A;  . PACEMAKER INSERTION  2006   Medtronic DDDR E2DRO1, SERIAL #  [id number]  . surgery of ear      Allergies  Allergen Reactions  . Adhesive [Tape] Itching and Rash    Please use "paper" tape  . Codeine Nausea Only  . Triamterene-Hctz Other (See Comments)    fatigue  . Vasotec Diarrhea    Outpatient Encounter Medications as of 07/25/2019  Medication Sig  . acetaminophen (TYLENOL) 500 MG tablet Take 500 mg by mouth every 6 (six) hours as needed.  Marland Kitchen aspirin 81 MG tablet Take 81 mg by mouth daily.   . beta carotene w/minerals (OCUVITE) tablet Take 1 tablet by mouth daily.  . Cholecalciferol (VITAMIN D-3) 1000 units CAPS Take 1,000 Units by mouth daily.  . hydrochlorothiazide (MICROZIDE) 12.5 MG capsule TAKE (1) CAPSULE  DAILY.  . levothyroxine (SYNTHROID) 50 MCG tablet TAKE 1 TABLET ONCE DAILY.  Marland Kitchen lisinopril (ZESTRIL) 10 MG tablet TAKE 1 TABLET ONCE DAILY.  Marland Kitchen loperamide (IMODIUM A-D) 2 MG tablet Take 2 mg by mouth daily as needed for diarrhea or loose stools.  . metoprolol succinate (TOPROL-XL) 25 MG 24 hr tablet TAKE 1 TABLET ONCE DAILY.  . pravastatin (PRAVACHOL) 20 MG tablet TAKE 1 TABLET EACH DAY.  . [DISCONTINUED] ibuprofen (ADVIL,MOTRIN) 200 MG tablet Take 200 mg by mouth as directed.   No facility-administered encounter medications on file as of 07/25/2019.    Review of Systems:  Review of Systems  Constitutional: Negative.   HENT: Negative.   Respiratory: Negative.    Cardiovascular: Negative.   Gastrointestinal: Negative.   Genitourinary: Positive for frequency and urgency.  Musculoskeletal: Negative.   Skin: Positive for wound.  Neurological: Negative.   Psychiatric/Behavioral: Negative.     Health Maintenance  Topic Date Due  . INFLUENZA VACCINE  09/28/2019  . TETANUS/TDAP  05/01/2021  . COVID-19 Vaccine  Completed  . PNA vac Low Risk Adult  Completed  . DEXA SCAN  Addressed    Physical Exam: Vitals:   07/25/19 0953  BP: (!) 166/80  Pulse: 80  Temp: (!) 96.6 F (35.9 C)  SpO2: 96%  Weight: 137 lb 3.2 oz (62.2 kg)  Height: 5\' 3"  (1.6 m)   Body mass index is 24.3 kg/m. Physical Exam Vitals reviewed.  Constitutional:      Appearance: Normal appearance.  HENT:     Head: Normocephalic.     Nose: Nose normal.     Mouth/Throat:     Mouth: Mucous membranes are moist.     Pharynx: Oropharynx is clear.  Eyes:     Pupils: Pupils are equal, round, and reactive to light.  Cardiovascular:     Rate and Rhythm: Normal rate and regular rhythm.     Pulses: Normal pulses.  Pulmonary:     Effort: Pulmonary effort is normal. No respiratory distress.     Breath sounds: Normal breath sounds. No wheezing or rales.  Abdominal:     General: Abdomen is flat.     Palpations: Abdomen is soft.  Musculoskeletal:        General: Swelling present.     Cervical back: Neck supple.  Skin:    Comments: Has Open Wound in Right Leg with Redness around it. Mild Tender. Dressing was Wet due to discharge.   Neurological:     General: No focal deficit present.     Mental Status: She is alert and oriented to person, place, and time.  Psychiatric:        Mood and Affect: Mood normal.        Thought Content: Thought content normal.     Labs reviewed: Basic Metabolic Panel: Recent Labs    07/29/18 1046 07/22/19 0715  NA 142 139  K 4.8 3.7  CL 104 103  CO2 24 26  GLUCOSE 88 103*  BUN 47* 34*  CREATININE 1.81* 1.30*  CALCIUM 9.7 8.9  TSH 2.45  0.84   Liver Function Tests: Recent Labs    07/29/18 1046 07/22/19 0715  AST 28 13  ALT 38* 10  BILITOT 0.6 0.9  PROT 6.5 5.7*   No results for input(s): LIPASE, AMYLASE in the last 8760 hours. No results for input(s): AMMONIA in the last 8760 hours. CBC: Recent Labs    07/29/18 1046 07/22/19 0715  WBC 10.4 11.0*  NEUTROABS 7,790 6,424  HGB 13.4 13.7  HCT 42.5 42.5  MCV 89.5 91.4  PLT 189 226   Lipid Panel: Recent Labs    07/29/18 1046 07/22/19 0715  CHOL 178 143  HDL 62 62  LDLCALC 95 64  TRIG 118 91  CHOLHDL 2.9 2.3   No results found for: HGBA1C  Procedures since last visit: No results found.  Assessment/Plan  Surgical wound infection with Mild Leucocytosis Start on Doxycyline 100 mg 7 Days Was recently treated and she says the redness did get better Continue Mupirocin dressing Per Dermatology She is Going to call Dr Nevada Crane Office for Follow up   Essential hypertension BP elevated today  No Change on Microzide and Lisinopril and Toprool  PAF (paroxysmal atrial fibrillation) (HCC) On Toprol Not on Anticoagulation Stage 3b chronic kidney disease Creat Improved Avoid NSAIDS Hyperlipidemia Continue Prevachol Primary osteoarthritis involving multiple joints Tylenol PRN avoid Motrin GERD Can take Pepcid PRN  Labs/tests ordered:  * No order type specified * Next appt:  Visit date not found

## 2019-07-25 NOTE — Patient Instructions (Signed)
Take pepcid 20 mg 1 each day as needed for reflux.  Call Dr. Nevada Crane today for appointment for wound.

## 2019-07-29 ENCOUNTER — Other Ambulatory Visit: Payer: Self-pay | Admitting: *Deleted

## 2019-07-29 ENCOUNTER — Other Ambulatory Visit: Payer: Self-pay | Admitting: Internal Medicine

## 2019-07-29 MED ORDER — PRAVASTATIN SODIUM 20 MG PO TABS: ORAL_TABLET | ORAL | 1 refills | Status: DC

## 2019-07-29 NOTE — Telephone Encounter (Signed)
See if cpe due

## 2019-07-29 NOTE — Telephone Encounter (Signed)
[facility]  

## 2019-07-30 DIAGNOSIS — Z85828 Personal history of other malignant neoplasm of skin: Secondary | ICD-10-CM | POA: Diagnosis not present

## 2019-07-30 DIAGNOSIS — Z08 Encounter for follow-up examination after completed treatment for malignant neoplasm: Secondary | ICD-10-CM | POA: Diagnosis not present

## 2019-08-12 ENCOUNTER — Other Ambulatory Visit: Payer: Self-pay

## 2019-08-12 ENCOUNTER — Ambulatory Visit (INDEPENDENT_AMBULATORY_CARE_PROVIDER_SITE_OTHER): Payer: Medicare Other | Admitting: Ophthalmology

## 2019-08-12 ENCOUNTER — Encounter (INDEPENDENT_AMBULATORY_CARE_PROVIDER_SITE_OTHER): Payer: Self-pay | Admitting: Ophthalmology

## 2019-08-12 DIAGNOSIS — H353211 Exudative age-related macular degeneration, right eye, with active choroidal neovascularization: Secondary | ICD-10-CM | POA: Diagnosis not present

## 2019-08-12 MED ORDER — AFLIBERCEPT 2MG/0.05ML IZ SOLN FOR KALEIDOSCOPE
2.0000 mg | INTRAVITREAL | Status: AC | PRN
  Administered 2019-08-12: 2 mg via INTRAVITREAL

## 2019-08-12 NOTE — Assessment & Plan Note (Signed)
The nature of wet macular degeneration was discussed with the patient.  Forms of therapy reviewed include the use of Anti-VEGF medications injected painlessly into the eye, as well as other possible treatment modalities, including thermal laser therapy. Fellow eye involvement and risks were discussed with the patient. Upon the finding of wet age related macular degeneration, treatment will be offered. The treatment regimen is on a treat as needed basis with the intent to treat if necessary and extend interval of exams when possible. On average 1 out of 6 patients do not need lifetime therapy. However, the risk of recurrent disease is high for a lifetime.  Initially monthly, then periodic, examinations and evaluations will determine whether the next treatment is required on the day of the examination.  OD improved compared to November 2020.  Currently on 8-week interval.  We will repeat examination in 8 weeks or intravitreal injection today Eylea

## 2019-08-12 NOTE — Progress Notes (Signed)
08/12/2019     CHIEF COMPLAINT Patient presents for Retina Follow Up   HISTORY OF PRESENT ILLNESS: [name] [name] is a 86 y.o. female who presents to the clinic today for:   HPI    Retina Follow Up    Patient presents with  Wet AMD.  In right eye.  Duration of 8 weeks.  Since onset it is stable.          Comments    8 week follow up - OCT OU, Poss Eylea OD Patient denies change in vision and overall has no complaints.        Last edited by Gerda Diss on 08/12/2019 11:08 AM. (History)      Referring physician: Mast, Man X, NP [address],  [place] [address]  HISTORICAL INFORMATION:   Selected notes from the MEDICAL RECORD NUMBER       CURRENT MEDICATIONS: No current outpatient medications on file. (Ophthalmic Drugs)   No current facility-administered medications for this visit. (Ophthalmic Drugs)   Current Outpatient Medications (Other)  Medication Sig  . acetaminophen (TYLENOL) 500 MG tablet Take 500 mg by mouth every 6 (six) hours as needed.  Marland Kitchen aspirin 81 MG tablet Take 81 mg by mouth daily.   . beta carotene w/minerals (OCUVITE) tablet Take 1 tablet by mouth daily.  . Cholecalciferol (VITAMIN D-3) 1000 units CAPS Take 1,000 Units by mouth daily.  . hydrochlorothiazide (MICROZIDE) 12.5 MG capsule TAKE (1) CAPSULE DAILY.  Marland Kitchen levothyroxine (SYNTHROID) 50 MCG tablet TAKE 1 TABLET ONCE DAILY.  Marland Kitchen lisinopril (ZESTRIL) 10 MG tablet TAKE 1 TABLET ONCE DAILY.  Marland Kitchen loperamide (IMODIUM A-D) 2 MG tablet Take 2 mg by mouth daily as needed for diarrhea or loose stools.  . metoprolol succinate (TOPROL-XL) 25 MG 24 hr tablet TAKE 1 TABLET ONCE DAILY.  . pravastatin (PRAVACHOL) 20 MG tablet Take one tablet by mouth once daily   No current facility-administered medications for this visit. (Other)      REVIEW OF SYSTEMS:    ALLERGIES Allergies  Allergen Reactions  . Adhesive [Tape] Itching and Rash    Please use "paper" tape  . Codeine Nausea Only  .  Triamterene-Hctz Other (See Comments)    fatigue  . Vasotec Diarrhea    PAST MEDICAL HISTORY Past Medical History:  Diagnosis Date  . Breast cancer ([place])   . Cardiac pacemaker in situ 04/01/2004   Original implant 04/01/04 Dr. Doreatha Lew Indications syncope with Mobitz 2 heart block  RV lead Guidant 4470 52 cm bipolar lead, SN [id number] atrial lead Guidant bipolar serial #[id number].  Medtronic EnPulse Model number [id number], SN PNB [id number]   . Carotid bruit 10/25/2010   right  . Diverticulosis   . H/O syncope   . 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 Hiatal hernia   . HTN (hypertension), benign   . Hyperlipidemia   . Melanoma of back ([place]) 2009  . Mobitz II   . Osteoporosis    Past Surgical History:  Procedure Laterality Date  . BREAST BIOPSY  1960   Left  . BREAST LUMPECTOMY  1960   left 2017  . BREAST LUMPECTOMY WITH RADIOACTIVE SEED LOCALIZATION Left 09/10/2015   Procedure: BREAST LUMPECTOMY WITH RADIOACTIVE SEED LOCALIZATION;  Surgeon: Excell Seltzer, MD;  Location: [place];  Service: General;  Laterality: Left;  . CATARACT EXTRACTION Bilateral 2002   Dr. Katy Fitch  . CATARACT EXTRACTION, BILATERAL  2001  . INSERT / REPLACE / REMOVE PACEMAKER    .  LOM HEMOTYMPANUM THROAT & SINUS BX  1977  . PACEMAKER GENERATOR CHANGE N/A 08/16/2012   Procedure: PACEMAKER GENERATOR CHANGE;  Surgeon: Deboraha Sprang, MD;  Location[facility] C[facility] LAB;  Service: Cardiovascular;  Laterality: N/A;  . PACEMAKER INSERTION  2006   Medtronic DDDR E2DRO1, SERIAL #  [id number] H  . surgery of ear      FAMILY HISTORY Family History  Problem Relation Age of Onset  . Stroke Mother 59  . Kidney disease Father 64  . Cirrhosis Son     SOCIAL HISTORY Social History   Tobacco Use  . Smoking status: Former Smoker    Packs/day: 0.33    Years: 10.00    Pack years: 3.30    Types: Cigarettes    Quit date: 02/27/1946    Years since quitting: 73.5  . Smokeless tobacco: Never Used  Vaping Use  . Vaping Use: Never  used  Substance Use Topics  . Alcohol use: Yes    Alcohol/week: 7.0 standard drinks    Types: 7 Glasses of wine per week    Comment: wine  . Drug use: No         OPHTHALMIC EXAM:  Base Eye Exam    Visual Acuity (Snellen - Linear)      Right Left   Dist cc 20/50-2 CF @ 1'   Dist ph cc NI NI   Correction: Glasses       Tonometry (Tonopen, 11:15 AM)      Right Left   Pressure 14 15       Pupils      Pupils Dark Light Shape React APD   Right PERRL 4 4 Round Minimal None   Left PERRL 4 4 Round Minimal None       Visual Fields (Counting fingers)      Left Right    Full Full       Extraocular Movement      Right Left    Full Full       Neuro/Psych    Oriented x3: Yes   Mood/Affect: Normal       Dilation    Right eye: 1.0% Mydriacyl, 2.5% Phenylephrine @ 11:16 AM        Slit Lamp and Fundus Exam    External Exam      Right Left   External Normal Normal       Slit Lamp Exam      Right Left   Lids/Lashes Normal Normal   Conjunctiva/Sclera White and quiet White and quiet   Cornea Clear Clear   Anterior Chamber Deep and quiet Deep and quiet   Iris Round and reactive Round and reactive   Lens Posterior chamber intraocular lens Posterior chamber intraocular lens   Anterior Vitreous Normal Normal       Fundus Exam      Right Left   Posterior Vitreous Posterior vitreous detachment    Disc Normal    C/D Ratio 0.4    Macula Atrophy, Retinal pigment epithelial atrophy, Age related macular degeneration, Early age related macular degeneration, [place], no exudates, no hemorrhage, no macular thickening    Vessels Normal    Periphery Normal           IMAGING AND PROCEDURES  Imaging and Procedures for 08/12/19  OCT, Retina - OU - Both Eyes       Right Eye Quality was good. Scan locations included subfoveal. Central Foveal Thickness: 281. Progression has improved. Findings include pigment epithelial detachment, no  SRF, no IRF.   Left Eye Quality was  good. Scan locations included subfoveal. Central Foveal Thickness: 299. Progression has been stable. Findings include abnormal foveal contour, disciform scar, outer retinal atrophy, central retinal atrophy.   Notes OD much improved on Eylea, currently at 8-week interval of examination.  Last subretinal fluid identified was November 2020 OD       Intravitreal Injection, Pharmacologic Agent - OD - Right Eye       Time Out 08/12/2019. 12:01 PM. Confirmed correct patient, procedure, site, and patient consented.   Anesthesia Topical anesthesia was used. Anesthetic medications included Akten 3.5%.   Procedure Preparation included Tobramycin 0.3%, 10% betadine to eyelids. A 30 gauge needle was used.   Injection:  2 mg aflibercept Alfonse Flavors) SOLN   NDC: [id number], Lot: [id number]   Route: Intravitreal, Site: Right Eye, Waste: 0 mg  Post-op Post injection exam found visual acuity of at least counting fingers. The patient tolerated the procedure well. There were no complications. The patient received written and verbal post procedure care education. Post injection medications were not given.                 ASSESSMENT/PLAN:  Exudative age-related macular degeneration of right eye with active choroidal neovascularization (HCC) The nature of wet macular degeneration was discussed with the patient.  Forms of therapy reviewed include the use of Anti-VEGF medications injected painlessly into the eye, as well as other possible treatment modalities, including thermal laser therapy. Fellow eye involvement and risks were discussed with the patient. Upon the finding of wet age related macular degeneration, treatment will be offered. The treatment regimen is on a treat as needed basis with the intent to treat if necessary and extend interval of exams when possible. On average 1 out of 6 patients do not need lifetime therapy. However, the risk of recurrent disease is high for a lifetime.  Initially  monthly, then periodic, examinations and evaluations will determine whether the next treatment is required on the day of the examination.  OD improved compared to November 2020.  Currently on 8-week interval.  We will repeat examination in 8 weeks or intravitreal injection today Eylea      ICD-10-CM   1. Exudative age-related macular degeneration of right eye with active choroidal neovascularization (HCC)  H35.3211 OCT, Retina - OU - Both Eyes    Intravitreal Injection, Pharmacologic Agent - OD - Right Eye    aflibercept (EYLEA) SOLN 2 mg    1.  Excellent clinical response to wet ARMD OD on Eylea.  Currently at 8-week exam interval.  Repeat intravitreal Eylea today.  2.  Exam OD in 8 weeks and likely intravitreal Eylea  3.  May plan to extend the interval after next visit.  Ophthalmic Meds Ordered this visit:  Meds ordered this encounter  Medications  . aflibercept (EYLEA) SOLN 2 mg       Return in about 8 weeks (around 10/07/2019) for dilate, OD, EYLEA OCT.  There are no Patient Instructions on file for this visit.   Explained the diagnoses, plan, and follow up with the patient and they expressed understanding.  Patient expressed understanding of the importance of proper follow up care.   Clent Demark Shakeela Rabadan M.D. Diseases & Surgery of the Retina and Vitreous Retina & Diabetic [place] 08/12/19     Abbreviations: M myopia (nearsighted); A astigmatism; H hyperopia (farsighted); P presbyopia; Mrx spectacle prescription;  CTL contact lenses; OD right eye; OS left eye; OU both eyes  XT exotropia; ET esotropia; PEK punctate epithelial keratitis; PEE punctate epithelial erosions; DES dry eye syndrome; MGD meibomian gland dysfunction; ATs artificial tears; PFAT's preservative free artificial tears; [place] nuclear sclerotic cataract; PSC posterior subcapsular cataract; ERM epi-retinal membrane; PVD posterior vitreous detachment; RD retinal detachment; DM diabetes mellitus; DR diabetic  retinopathy; NPDR non-proliferative diabetic retinopathy; PDR proliferative diabetic retinopathy; CSME clinically significant macular edema; DME diabetic macular edema; dbh dot blot hemorrhages; CWS cotton wool spot; POAG primary open angle glaucoma; C/D cup-to-disc ratio; HVF humphrey visual field; GVF goldmann visual field; OCT optical coherence tomography; IOP intraocular pressure; BRVO Branch retinal vein occlusion; CRVO central retinal vein occlusion; CRAO central retinal artery occlusion; BRAO branch retinal artery occlusion; RT retinal tear; SB scleral buckle; PPV pars plana vitrectomy; VH Vitreous hemorrhage; PRP panretinal laser photocoagulation; IVK intravitreal kenalog; VMT vitreomacular traction; MH Macular hole;  NVD neovascularization of the disc; NVE neovascularization elsewhere; AREDS age related eye disease study; ARMD age related macular degeneration; POAG primary open angle glaucoma; EBMD epithelial/anterior basement membrane dystrophy; ACIOL anterior chamber intraocular lens; IOL intraocular lens; PCIOL posterior chamber intraocular lens; Phaco/IOL phacoemulsification with intraocular lens placement; [place] photorefractive keratectomy; LASIK laser assisted in situ keratomileusis; HTN hypertension; DM diabetes mellitus; COPD chronic obstructive pulmonary disease

## 2019-08-25 ENCOUNTER — Other Ambulatory Visit: Payer: Self-pay | Admitting: Internal Medicine

## 2019-08-25 NOTE — Telephone Encounter (Signed)
Due for annual medicare wellness and CPE. Where does she want to get labs- at [facility]? Can send order

## 2019-08-26 ENCOUNTER — Other Ambulatory Visit: Payer: Self-pay | Admitting: Nurse Practitioner

## 2019-08-26 NOTE — Telephone Encounter (Signed)
Called patient to book CPE she said she is staying with a friend and she goes to [facility].

## 2019-08-26 NOTE — Telephone Encounter (Signed)
rx sent to pharmacy by e-script  

## 2019-09-09 ENCOUNTER — Other Ambulatory Visit: Payer: Self-pay | Admitting: Nurse Practitioner

## 2019-09-10 ENCOUNTER — Ambulatory Visit (INDEPENDENT_AMBULATORY_CARE_PROVIDER_SITE_OTHER): Payer: Medicare Other | Admitting: *Deleted

## 2019-09-10 DIAGNOSIS — I442 Atrioventricular block, complete: Secondary | ICD-10-CM

## 2019-09-10 LAB — CUP PACEART REMOTE DEVICE CHECK
Battery Impedance: 1886 Ohm
Battery Remaining Longevity: 30 mo
Battery Voltage: 2.75 V
Brady Statistic AP VP Percent: 19 %
Brady Statistic AP VS Percent: 0 %
Brady Statistic AS VP Percent: 81 %
Brady Statistic AS VS Percent: 0 %
Date Time Interrogation Session: 20210714132338
Implantable Lead Implant Date: 20060203
Implantable Lead Implant Date: 20060203
Implantable Lead Location: 753859
Implantable Lead Location: 753860
Implantable Lead Model: 4469
Implantable Lead Model: 4470
Implantable Lead Serial Number: 458590
Implantable Lead Serial Number: 500613
Implantable Pulse Generator Implant Date: 20140620
Lead Channel Impedance Value: 406 Ohm
Lead Channel Impedance Value: 511 Ohm
Lead Channel Pacing Threshold Amplitude: 1.125 V
Lead Channel Pacing Threshold Amplitude: 1.375 V
Lead Channel Pacing Threshold Pulse Width: 0.4 ms
Lead Channel Pacing Threshold Pulse Width: 0.4 ms
Lead Channel Setting Pacing Amplitude: 2.5 V
Lead Channel Setting Pacing Amplitude: 2.75 V
Lead Channel Setting Pacing Pulse Width: 0.4 ms
Lead Channel Setting Sensing Sensitivity: 2.8 mV

## 2019-09-11 NOTE — Progress Notes (Signed)
Remote pacemaker transmission.   

## 2019-10-07 ENCOUNTER — Encounter (INDEPENDENT_AMBULATORY_CARE_PROVIDER_SITE_OTHER): Payer: Self-pay | Admitting: Ophthalmology

## 2019-10-07 ENCOUNTER — Ambulatory Visit (INDEPENDENT_AMBULATORY_CARE_PROVIDER_SITE_OTHER): Payer: Medicare Other | Admitting: Ophthalmology

## 2019-10-07 ENCOUNTER — Other Ambulatory Visit: Payer: Self-pay

## 2019-10-07 DIAGNOSIS — H353211 Exudative age-related macular degeneration, right eye, with active choroidal neovascularization: Secondary | ICD-10-CM

## 2019-10-07 MED ORDER — AFLIBERCEPT 2MG/0.05ML IZ SOLN FOR KALEIDOSCOPE
2.0000 mg | INTRAVITREAL | Status: AC | PRN
  Administered 2019-10-07: 2 mg via INTRAVITREAL

## 2019-10-07 NOTE — Progress Notes (Signed)
10/07/2019     CHIEF COMPLAINT Patient presents for Retina Follow Up   HISTORY OF PRESENT ILLNESS: [name] [name] is a 86 y.o. female who presents to the clinic today for:   HPI    Retina Follow Up    Patient presents with  Wet AMD.  In right eye.  Severity is moderate.  Duration of 8 weeks.  Since onset it is stable.  I, the attending physician,  performed the HPI with the patient and updated documentation appropriately.          Comments    8 Week Wet Amd f\u OD. Possible Eylea OD. OCT  Pt state vision is stable. Denies any complaints       Last edited by Tilda Franco on 10/07/2019 10:13 AM. (History)      Referring physician: Mast, Man X, NP [address],  [place] [address]  HISTORICAL INFORMATION:   Selected notes from the MEDICAL RECORD NUMBER       CURRENT MEDICATIONS: No current outpatient medications on file. (Ophthalmic Drugs)   No current facility-administered medications for this visit. (Ophthalmic Drugs)   Current Outpatient Medications (Other)  Medication Sig  . acetaminophen (TYLENOL) 500 MG tablet Take 500 mg by mouth every 6 (six) hours as needed.  Marland Kitchen aspirin 81 MG tablet Take 81 mg by mouth daily.   . beta carotene w/minerals (OCUVITE) tablet Take 1 tablet by mouth daily.  . Cholecalciferol (VITAMIN D-3) 1000 units CAPS Take 1,000 Units by mouth daily.  . hydrochlorothiazide (MICROZIDE) 12.5 MG capsule TAKE (1) CAPSULE DAILY.  Marland Kitchen levothyroxine (SYNTHROID) 50 MCG tablet TAKE 1 TABLET ONCE DAILY.  Marland Kitchen lisinopril (ZESTRIL) 10 MG tablet TAKE 1 TABLET ONCE DAILY.  Marland Kitchen loperamide (IMODIUM A-D) 2 MG tablet Take 2 mg by mouth daily as needed for diarrhea or loose stools.  . metoprolol succinate (TOPROL-XL) 25 MG 24 hr tablet TAKE 1 TABLET ONCE DAILY.  . pravastatin (PRAVACHOL) 20 MG tablet Take one tablet by mouth once daily   No current facility-administered medications for this visit. (Other)      REVIEW OF  SYSTEMS:    ALLERGIES Allergies  Allergen Reactions  . Adhesive [Tape] Itching and Rash    Please use "paper" tape  . Codeine Nausea Only  . Triamterene-Hctz Other (See Comments)    fatigue  . Vasotec Diarrhea    PAST MEDICAL HISTORY Past Medical History:  Diagnosis Date  . Breast cancer ([place])   . Cardiac pacemaker in situ 04/01/2004   Original implant 04/01/04 Dr. Doreatha Lew Indications syncope with Mobitz 2 heart block  RV lead Guidant 4470 52 cm bipolar lead, SN [id number] atrial lead Guidant bipolar serial #[id number].  Medtronic EnPulse Model number [id number], SN PNB [id number]   . Carotid bruit 10/25/2010   right  . Diverticulosis   . H/O syncope   . 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 Hiatal hernia   . HTN (hypertension), benign   . Hyperlipidemia   . Melanoma of back ([place]) 2009  . Mobitz II   . Osteoporosis    Past Surgical History:  Procedure Laterality Date  . BREAST BIOPSY  1960   Left  . BREAST LUMPECTOMY  1960   left 2017  . BREAST LUMPECTOMY WITH RADIOACTIVE SEED LOCALIZATION Left 09/10/2015   Procedure: BREAST LUMPECTOMY WITH RADIOACTIVE SEED LOCALIZATION;  Surgeon: Excell Seltzer, MD;  Location: [facility];  Service: General;  Laterality: Left;  . CATARACT EXTRACTION Bilateral 2002  Dr. Katy Fitch  . CATARACT EXTRACTION, BILATERAL  2001  . INSERT / REPLACE / REMOVE PACEMAKER    . LOM HEMOTYMPANUM THROAT & SINUS BX  1977  . PACEMAKER GENERATOR CHANGE N/A 08/16/2012   Procedure: PACEMAKER GENERATOR CHANGE;  Surgeon: Deboraha Sprang, MD;  Location: [facility] CATH LAB;  Service: Cardiovascular;  Laterality: N/A;  . PACEMAKER INSERTION  2006   Medtronic DDDR E2DRO1, SERIAL #  [id number]  . surgery of ear      FAMILY HISTORY Family History  Problem Relation Age of Onset  . Stroke Mother 16  . Kidney disease Father 45  . Cirrhosis Son     SOCIAL HISTORY Social History   Tobacco Use  . Smoking status: Former Smoker    Packs/day: 0.33    Years: 10.00    Pack years:  3.30    Types: Cigarettes    Quit date: 02/27/1946    Years since quitting: 73.6  . Smokeless tobacco: Never Used  Vaping Use  . Vaping Use: Never used  Substance Use Topics  . Alcohol use: Yes    Alcohol/week: 7.0 standard drinks    Types: 7 Glasses of wine per week    Comment: wine  . Drug use: No         OPHTHALMIC EXAM:  Base Eye Exam    Visual Acuity (Snellen - Linear)      Right Left   Dist cc 20/50 -2 CF @ 1'   Dist ph cc NI NI   Correction: Glasses       Tonometry (Tonopen, 10:17 AM)      Right Left   Pressure 14 13       Pupils      Dark Light Shape React APD   Right 4 4 Round Minimal None   Left 4 4 Round Minimal None       Visual Fields (Counting fingers)      Left Right    Full Full       Neuro/Psych    Oriented x3: Yes   Mood/Affect: Normal       Dilation    Right eye: 1.0% Mydriacyl, 2.5% Phenylephrine @ 10:17 AM        Slit Lamp and Fundus Exam    External Exam      Right Left   External Normal Normal       Slit Lamp Exam      Right Left   Lids/Lashes Normal Normal   Conjunctiva/Sclera White and quiet White and quiet   Cornea Clear Clear   Anterior Chamber Deep and quiet Deep and quiet   Iris Round and reactive Round and reactive   Lens Posterior chamber intraocular lens Posterior chamber intraocular lens   Anterior Vitreous Normal Normal       Fundus Exam      Right Left   Posterior Vitreous Posterior vitreous detachment    Disc Normal    C/D Ratio 0.5    Macula Atrophy, Retinal pigment epithelial atrophy, Age related macular degeneration, Early age related macular degeneration, [place], no exudates, no hemorrhage, no macular thickening    Vessels Normal    Periphery Normal           IMAGING AND PROCEDURES  Imaging and Procedures for 10/07/19  OCT, Retina - OU - Both Eyes       Right Eye Quality was good. Scan locations included subfoveal. Central Foveal Thickness: 274. Progression has improved. Findings include  abnormal foveal contour, central  retinal atrophy, outer retinal atrophy, subretinal scarring.   Left Eye Quality was good. Scan locations included subfoveal. Central Foveal Thickness: 268. Findings include abnormal foveal contour, central retinal atrophy, outer retinal atrophy.   Notes Small region of intraretinal and outer retinal CME inferior to the fovea the right eye has improved since last visit, thus less active CNVM associated OD.  Currently at 8-week exam interval.       Intravitreal Injection, Pharmacologic Agent - OD - Right Eye       Time Out 10/07/2019. 11:02 AM. Confirmed correct patient, procedure, site, and patient consented.   Anesthesia Topical anesthesia was used. Anesthetic medications included Akten 3.5%.   Procedure Preparation included Tobramycin 0.3%, 10% betadine to eyelids, 5% betadine to ocular surface. A 30 gauge needle was used.   Injection:  2 mg aflibercept Alfonse Flavors) SOLN   NDC: [id number], Lot: [id number]   Route: Intravitreal, Site: Right Eye, Waste: 0 mg  Post-op Post injection exam found visual acuity of at least counting fingers. The patient tolerated the procedure well. There were no complications. Post injection medications were not given.                 ASSESSMENT/PLAN:  Exudative age-related macular degeneration of right eye with active choroidal neovascularization ([place]) Repeat intravitreal Eylea today currently at 8-week examination.  Acuity has remained stable and outer retinal CME signifying residual CNVM has improved      ICD-10-CM   1. Exudative age-related macular degeneration of right eye with active choroidal neovascularization (HCC)  H35.3211 OCT, Retina - OU - Both Eyes    Intravitreal Injection, Pharmacologic Agent - OD - Right Eye    aflibercept (EYLEA) SOLN 2 mg    1.  OD currently at 8-week exam interval, improved anatomy on intravitreal Eylea.  We will repeat today to maintain visual acuity and functioning of  monocular patient  2.  3.  Ophthalmic Meds Ordered this visit:  Meds ordered this encounter  Medications  . aflibercept (EYLEA) SOLN 2 mg       Return in about 8 weeks (around 12/02/2019) for dilate, OD, EYLEA OCT.  Patient Instructions  Patient and family to notify the office promptly should new visual acuity concerns, distortions or declines occur    Explained the diagnoses, plan, and follow up with the patient and they expressed understanding.  Patient expressed understanding of the importance of proper follow up care.   Clent Demark Faraaz Wolin M.D. Diseases & Surgery of the Retina and Vitreous [facility]ina [facility] Diabetic [place] 10/07/19     Abbreviations: M myopia (nearsighted); A astigmatism; H hyperopia (farsighted); P presbyopia; Mrx spectacle prescription;  CTL contact lenses; OD right eye; OS left eye; OU both eyes  XT exotropia; ET esotropia; PEK punctate epithelial keratitis; PEE punctate epithelial erosions; DES dry eye syndrome; MGD meibomian gland dysfunction; ATs artificial tears; PFAT's preservative free artificial tears; [place] nuclear sclerotic cataract; PSC posterior subcapsular cataract; ERM epi-retinal membrane; PVD posterior vitreous detachment; RD retinal detachment; DM diabetes mellitus; DR diabetic retinopathy; NPDR non-proliferative diabetic retinopathy; PDR proliferative diabetic retinopathy; CSME clinically significant macular edema; DME diabetic macular edema; dbh dot blot hemorrhages; CWS cotton wool spot; POAG primary open angle glaucoma; C/D cup-to-disc ratio; HVF humphrey visual field; GVF goldmann visual field; OCT optical coherence tomography; IOP intraocular pressure; BRVO Branch retinal vein occlusion; CRVO central retinal vein occlusion; CRAO central retinal artery occlusion; BRAO branch retinal artery occlusion; RT retinal tear; SB scleral buckle; PPV pars plana vitrectomy; VH Vitreous hemorrhage;  PRP panretinal laser photocoagulation; IVK intravitreal kenalog; VMT  vitreomacular traction; MH Macular hole;  NVD neovascularization of the disc; NVE neovascularization elsewhere; AREDS age related eye disease study; ARMD age related macular degeneration; POAG primary open angle glaucoma; EBMD epithelial/anterior basement membrane dystrophy; ACIOL anterior chamber intraocular lens; IOL intraocular lens; PCIOL posterior chamber intraocular lens; Phaco/IOL phacoemulsification with intraocular lens placement; [place] photorefractive keratectomy; LASIK laser assisted in situ keratomileusis; HTN hypertension; DM diabetes mellitus; COPD chronic obstructive pulmonary disease

## 2019-10-07 NOTE — Patient Instructions (Signed)
Patient and family to notify the office promptly should new visual acuity concerns, distortions or declines occur

## 2019-10-07 NOTE — Assessment & Plan Note (Signed)
Repeat intravitreal Eylea today currently at 8-week examination.  Acuity has remained stable and outer retinal CME signifying residual CNVM has improved

## 2019-10-30 ENCOUNTER — Encounter: Payer: Self-pay | Admitting: Nurse Practitioner

## 2019-10-30 ENCOUNTER — Other Ambulatory Visit: Payer: Self-pay

## 2019-10-30 ENCOUNTER — Non-Acute Institutional Stay: Payer: Medicare Other | Admitting: Nurse Practitioner

## 2019-10-30 DIAGNOSIS — N184 Chronic kidney disease, stage 4 (severe): Secondary | ICD-10-CM | POA: Diagnosis not present

## 2019-10-30 DIAGNOSIS — E039 Hypothyroidism, unspecified: Secondary | ICD-10-CM

## 2019-10-30 DIAGNOSIS — I10 Essential (primary) hypertension: Secondary | ICD-10-CM | POA: Diagnosis not present

## 2019-10-30 DIAGNOSIS — I872 Venous insufficiency (chronic) (peripheral): Secondary | ICD-10-CM

## 2019-10-30 NOTE — Assessment & Plan Note (Signed)
Blood pressure is controlled, continue HCTZ, Lisinopril, Metoprolol.

## 2019-10-30 NOTE — Assessment & Plan Note (Signed)
TSH 0.84 07/22/19, continue Levothyroxine

## 2019-10-30 NOTE — Progress Notes (Signed)
Location:   clinic [place]   Place of Service:  Clinic (12) Provider: Marlana Latus NP  Code Status: DNR Goals of Care: [place] Advanced [place] 09/10/2015  Does Patient Have a Medical Advance Directive? Yes  Type of Advance Directive -  [place] in Chart? No - copy requested  Pre-existing out of facility DNR order (yellow form or pink MOST form) -     Chief Complaint  Patient presents with   Medical Management of Chronic Issues    Patient returns to the clinic for follow up.     HPI: Patient is a 86 y.o. female seen today for medical management of chronic diseases.    HTN, blood pressure is controlled on HCTZ 12.39m qd, Lisinopril 171mqd, Metoprolol 2586md.   Hypothyroidism, stable, on Levothyroxine 8m28md. TSH 0.84 07/22/19  RLE surgical wound, treated with Doxy in May  CKD creat 1.3 07/22/19     Past Medical History:  Diagnosis Date   [facility] in [facility] 04/01/2004   Original implant 04/01/04 Dr. TennDoreatha Lewications syncope with Mobitz 2 heart block  RV lead Guidant 4470 52 cm bipolar lead, SN [id number] lead Guidant bipolar serial #[id number] EnPulse Model number [id number] PNB [id number]  Carotid bruit 10/25/2010   right   Diverticulosis    H/O syncope    Hearing loss in right ear 05/26/2011   Hiatal hernia    HTN (hypertension), benign    Hyperlipidemia    Melanoma of back (HCC)[place]09   Mobitz II    Osteoporosis     Past Surgical History:  Procedure Laterality Date   BREAST BIOPSY  1960   Left   BREAST LUMPECTOMY  1960   left 2017   BREAST LUMPECTOMY WITH RADIOACTIVE SEED LOCALIZATION Left 09/10/2015   Procedure: BREAST LUMPECTOMY WITH RADIOACTIVE SEED LOCALIZATION;  Surgeon: BenjExcell Seltzer;  [facility];  Laterality: Left;   CATARACT EXTRACTION Bilateral 2002   Dr. GroaKaty FitchATARACT EXTRACTION, BILATERAL  2001   INSERT / REPLACE / REMOVE  PACEMAKER     LOM HEMOTYMPANUM THROAT & SINUS BX  1977   PACEMAKER GENERATOR CHANGE N/A 08/16/2012   Procedure: PACEMAKER GENERATOR CHANGE;  Surgeon: StevDeboraha Sprang;  [facility];  Service: Cardiovascular;  Laterality: N/A;   PACEMAKER INSERTION  2006   Medtronic DDDR E2DRO1, SERIAL #  [id number] surgery of ear      Allergies  Allergen Reactions   Adhesive [Tape] Itching and Rash    Please use "paper" tape   Codeine Nausea Only   Triamterene-Hctz Other (See Comments)    fatigue   Vasotec Diarrhea    Allergies as of 10/30/2019      Reactions   Adhesive [tape] Itching, Rash   Please use "paper" tape   Codeine Nausea Only   Triamterene-hctz Other (See Comments)   fatigue   Vasotec Diarrhea      Medication List       Accurate as of October 30, 2019 11:59 PM. If you have any questions, ask your nurse or doctor.        aspirin 81 MG tablet Take 81 mg by mouth daily.   beta carotene w/minerals tablet Take 1 tablet by mouth daily.   hydrochlorothiazide 12.5 MG capsule Commonly known as: MICROZIDE TAKE (1) CAPSULE DAILY.   levothyroxine 50 MCG tablet Commonly known as:  SYNTHROID TAKE 1 TABLET ONCE DAILY.   lisinopril 10 MG tablet Commonly known as: ZESTRIL TAKE 1 TABLET ONCE DAILY.   loperamide 2 MG tablet Commonly known as: IMODIUM A-D Take 2 mg by mouth daily as needed for diarrhea or loose stools.   metoprolol succinate 25 MG 24 hr tablet Commonly known as: TOPROL-XL TAKE 1 TABLET ONCE DAILY.   pravastatin 20 MG tablet Commonly known as: PRAVACHOL Take one tablet by mouth once daily   TYLENOL 500 MG tablet Generic drug: acetaminophen Take 500 mg by mouth every 6 (six) hours as needed.   Vitamin D-3 25 MCG (1000 UT) Caps Take 1,000 Units by mouth daily.       Review of Systems:  Review of Systems  Constitutional: Negative for fatigue, fever and unexpected weight change.  HENT: Positive for hearing loss. Negative for  congestion and voice change.        Hearing aids.   Eyes: Negative for visual disturbance.       S/p inj, macular degeneration  Respiratory: Negative for cough, shortness of breath and wheezing.   Cardiovascular: Positive for leg swelling. Negative for chest pain and palpitations.       RLE edema  Gastrointestinal: Negative for abdominal pain and constipation.  Genitourinary: Positive for frequency.       Every 2-3 hours at night  Musculoskeletal: Positive for arthralgias and gait problem.  Skin:       Lateral mid right lower leg surgical scar  Neurological: Negative for speech difficulty, weakness and headaches.       Tingling numbness in hands.   Psychiatric/Behavioral: Positive for sleep disturbance. Negative for agitation and behavioral problems.       Due to frequent urination at night.     Health Maintenance  Topic Date Due   INFLUENZA VACCINE  09/28/2019   TETANUS/TDAP  05/01/2021   COVID-19 Vaccine  Completed   PNA vac Low Risk Adult  Completed   DEXA SCAN  Addressed    Physical Exam: Vitals:   10/30/19 1307  BP: 138/68  Pulse: 96  Temp: (!) 96.8 F (36 C)  SpO2: 97%  Weight: 143 lb 12.8 oz (65.2 kg)  Height: _0  (1.6 m)   Body mass index is 25.47 kg/m. Physical Exam Vitals and nursing note reviewed.  Constitutional:      Appearance: Normal appearance.  HENT:     Head: Normocephalic and atraumatic.     Mouth/Throat:     Mouth: Mucous membranes are moist.  Eyes:     Extraocular Movements: Extraocular movements intact.     Conjunctiva/sclera: Conjunctivae normal.     Pupils: Pupils are equal, round, and reactive to light.     Comments: Mild ectropion R+L lower eyelids  Cardiovascular:     Rate and Rhythm: Normal rate and regular rhythm.     Heart sounds: Murmur heard.   Pulmonary:     Breath sounds: No rales.  Abdominal:     Palpations: Abdomen is soft.     Tenderness: There is no abdominal tenderness.  Musculoskeletal:     Cervical back:  Normal range of motion and neck supple.     Right lower leg: Edema present.     Left lower leg: No edema.     Comments: Trace edema RLE.  Ambulates with walker. Fullness left popliteal area.   Skin:    General: Skin is warm and dry.     Comments: Mild chronic venous insufficiency skin changes BLE. Lateral mid  right lower leg skin lesion, redness, warmth, tenderness, swelling surrounded skin lesion about the patient's palm size.   Neurological:     General: No focal deficit present.     Mental Status: She is alert and oriented to person, place, and time. Mental status is at baseline.     Motor: No weakness.     Coordination: Coordination normal.     Gait: Gait abnormal.  Psychiatric:        Mood and Affect: Mood normal.        Behavior: Behavior normal.        Thought Content: Thought content normal.        Judgment: Judgment normal.     Labs reviewed: Basic Metabolic Panel: Recent Labs    07/22/19 0715  NA 139  K 3.7  CL 103  CO2 26  GLUCOSE 103*  BUN 34*  CREATININE 1.30*  CALCIUM 8.9  TSH 0.84   Liver Function Tests: Recent Labs    07/22/19 0715  AST 13  ALT 10  BILITOT 0.9  PROT 5.7*   No results for input(s): LIPASE, AMYLASE in the last 8760 hours. No results for input(s): AMMONIA in the last 8760 hours. CBC: Recent Labs    07/22/19 0715  WBC 11.0*  NEUTROABS 6,424  HGB 13.7  HCT 42.5  MCV 91.4  PLT 226   Lipid Panel: Recent Labs    07/22/19 0715  CHOL 143  HDL 62  LDLCALC 64  TRIG 91  CHOLHDL 2.3   No results found for: HGBA1C  Procedures since last visit: Intravitreal Injection, Pharmacologic Agent - OD - Right Eye  Result Date: 10/07/2019 Time Out 10/07/2019. 11:02 AM. Confirmed correct patient, procedure, site, and patient consented. Anesthesia Topical anesthesia was used. Anesthetic medications included Akten 3.5%. Procedure Preparation included Tobramycin 0.3%, 10% betadine to eyelids, 5% betadine to ocular surface. A 30 gauge needle  was used. Injection: 2 mg aflibercept Alfonse Flavors) SOLN   NDC: [id number], Lot: [id number]   Route: Intravitreal, Site: Right Eye, Waste: 0 mg Post-op Post injection exam found visual acuity of at least counting fingers. The patient tolerated the procedure well. There were no complications. Post injection medications were not given.   OCT, Retina - OU - Both Eyes  Result Date: 10/07/2019 Right Eye Quality was good. Scan locations included subfoveal. Central Foveal Thickness: 274. Progression has improved. Findings include abnormal foveal contour, central retinal atrophy, outer retinal atrophy, subretinal scarring. Left Eye Quality was good. Scan locations included subfoveal. Central Foveal Thickness: 268. Findings include abnormal foveal contour, central retinal atrophy, outer retinal atrophy. Notes Small region of intraretinal and outer retinal CME inferior to the fovea the right eye has improved since last visit, thus less active CNVM associated OD.  Currently at 8-week exam interval.   Assessment/Plan  Essential hypertension Blood pressure is controlled, continue HCTZ, Lisinopril, Metoprolol.   Hypothyroidism TSH 0.84 07/22/19, continue Levothyroxine   Kidney disease, chronic, stage IV (severe, EGFR 15-29 ml/min) (HCC) Stable, Bun/creat 34/1.3, eGFR 33 07/22/19  Edema of both lower extremities due to peripheral venous insufficiency Trace in ankles, R>L, the patient stated it's not new, comes and goes, her baseline weight is about #142s, denied PND, cough, sputum production, chest pain/pressure, or palpitation. Lungs are clear. Observe.    Labs/tests ordered:  None  Next appt:  3-4 months with Dr. Lyndel Safe

## 2019-10-30 NOTE — Assessment & Plan Note (Signed)
Trace in ankles, R>L, the patient stated it's not new, comes and goes, her baseline weight is about #142s, denied PND, cough, sputum production, chest pain/pressure, or palpitation. Lungs are clear. Observe.

## 2019-10-30 NOTE — Assessment & Plan Note (Signed)
Stable, Bun/creat 34/1.3, eGFR 33 07/22/19

## 2019-10-31 ENCOUNTER — Encounter: Payer: Self-pay | Admitting: Nurse Practitioner

## 2019-11-07 ENCOUNTER — Other Ambulatory Visit: Payer: Self-pay

## 2019-11-07 ENCOUNTER — Telehealth: Payer: Self-pay

## 2019-11-07 ENCOUNTER — Encounter: Payer: Self-pay | Admitting: Family

## 2019-11-07 ENCOUNTER — Ambulatory Visit (INDEPENDENT_AMBULATORY_CARE_PROVIDER_SITE_OTHER): Payer: Medicare Other | Admitting: Family

## 2019-11-07 DIAGNOSIS — R197 Diarrhea, unspecified: Secondary | ICD-10-CM | POA: Diagnosis not present

## 2019-11-07 NOTE — Telephone Encounter (Signed)
Ms. adasha, boehme are scheduled for a virtual visit with your provider today.    Just as we do with appointments in the office, we must obtain your consent to participate.  Your consent will be active for this visit and any virtual visit you may have with one of our providers in the next 365 days.    If you have a MyChart account, I can also send a copy of this consent to you electronically.  All virtual visits are billed to your insurance company just like a traditional visit in the office.  As this is a virtual visit, video technology does not allow for your provider to perform a traditional examination.  This may limit your provider's ability to fully assess your condition.  If your provider identifies any concerns that need to be evaluated in person or the need to arrange testing such as labs, EKG, etc, we will make arrangements to do so.    Although advances in technology are sophisticated, we cannot ensure that it will always work on either your end or our end.  If the connection with a video visit is poor, we may have to switch to a telephone visit.  With either a video or telephone visit, we are not always able to ensure that we have a secure connection.   I need to obtain your verbal consent now.   Are you willing to proceed with your visit today?   SHAQUNA GEIGLE has provided verbal consent on 11/07/2019 for a virtual visit (video or telephone).   [place], [place] 11/07/2019  9:00 AM

## 2019-11-07 NOTE — Patient Instructions (Addendum)
-    drink Pedialyte or Gatorade after every loose stool. - continue to drink water 6-8 glasses daily  - Bland diet for the next 24 hrs then advance as tolerated  - Notify provider or go to ED/urgent care if symptoms worsen or fail to improve   Bland Diet A bland diet consists of foods that are often soft and do not have a lot of fat, fiber, or extra seasonings. Foods without fat, fiber, or seasoning are easier for the body to digest. They are also less likely to irritate your mouth, throat, stomach, and other parts of your digestive system. A bland diet is sometimes called a BRAT diet. What is my plan? Your health care provider or food and nutrition specialist (dietitian) may recommend specific changes to your diet to prevent symptoms or to treat your symptoms. These changes may include:  Eating small meals often.  Cooking food until it is soft enough to chew easily.  Chewing your food well.  Drinking fluids slowly.  Not eating foods that are very spicy, sour, or fatty.  Not eating citrus fruits, such as oranges and grapefruit. What do I need to know about this diet?  Eat a variety of foods from the bland diet food list.  Do not follow a bland diet longer than needed.  Ask your health care provider whether you should take vitamins or supplements. What foods can I eat? Grains  Hot cereals, such as cream of wheat. Rice. Bread, crackers, or tortillas made from refined white flour. Vegetables Canned or cooked vegetables. Mashed or boiled potatoes. Fruits  Bananas. Applesauce. Other types of cooked or canned fruit with the skin and seeds removed, such as canned peaches or pears. Meats and other proteins  Scrambled eggs. Creamy peanut butter or other nut butters. Lean, well-cooked meats, such as chicken or fish. Tofu. Soups or broths. Dairy Low-fat dairy products, such as milk, cottage cheese, or yogurt. Beverages  Water. Herbal tea. Apple juice. Fats and oils Mild salad  dressings. Canola or olive oil. Sweets and desserts Pudding. Custard. Fruit gelatin. Ice cream. The items listed above may not be a complete list of recommended foods and beverages. Contact a dietitian for more options. What foods are not recommended? Grains Whole grain breads and cereals. Vegetables Raw vegetables. Fruits Raw fruits, especially citrus, berries, or dried fruits. Dairy Whole fat dairy foods. Beverages Caffeinated drinks. Alcohol. Seasonings and condiments Strongly flavored seasonings or condiments. Hot sauce. Salsa. Other foods Spicy foods. Fried foods. Sour foods, such as pickled or fermented foods. Foods with high sugar content. Foods high in fiber. The items listed above may not be a complete list of foods and beverages to avoid. Contact a dietitian for more information. Summary  A bland diet consists of foods that are often soft and do not have a lot of fat, fiber, or extra seasonings.  Foods without fat, fiber, or seasoning are easier for the body to digest.  Check with your health care provider to see how long you should follow this diet plan. It is not meant to be followed for long periods. This information is not intended to replace advice given to you by your health care provider. Make sure you discuss any questions you have with your health care provider. Document Revised: 03/14/2017 Document Reviewed: 03/14/2017 Elsevier Patient Education  2020 [employer].

## 2019-11-07 NOTE — Progress Notes (Signed)
This service is provided via telemedicine  No vital signs collected/recorded due to the encounter was a telemedicine visit.   Location of patient (ex: home, work): Home.  Patient consents to a telephone visit: Yes.  Location of the provider (ex: office, home):  [facility].  Name of any referring provider: N/A  Names of all persons participating in the telemedicine service and their role in the encounter: Patient, Heriberto Antigua, [place], [place], Webb Silversmith, NP.    Time spent on call: 8 minutes spent on the phone with Medical Assistant.     Provider: Marlowe Sax FNP-C  Mast, Man X, NP  Patient Care Team: Mast, Man X, NP as PCP - General (Internal Medicine)  Extended Emergency Contact Information Primary Emergency Contact: Anastasia Fiedler, [place] Phone: [phone] Relation: Son  Code Status:  DNR Goals of care: Advanced Directive information Advanced Directives 11/07/2019  Does Patient Have a Medical Advance Directive? Yes  Type of Advance Directive Living will;Healthcare Power of Attorney  Does patient want to make changes to medical advance directive? No - Guardian declined  Copy of [place] in Chart? No - copy requested  Pre-existing out of facility DNR order (yellow form or pink MOST form) -     Chief Complaint  Patient presents with  . Acute Visit    Complains of Diarrhea, Loss of Appetite and Fatigue.    HPI:  Pt is a 86 y.o. female seen today for an acute visit for complains of diarrhea since Tuesday 11/04/2019.Started as  loose stool then progressed to watery stool.Had no appetite but has improved.she was able to eat a baked potatoes on Wednesday and had chicken noodle soup and crackers last night.Has also been drinking plenty of fluid including coffee.drinks a small glass of wine at bedtime too. She denies any fever,chills,loss of taste ,smell ,nausea or vomiting.Also denies any abdominal  pain.she does not recall eating any that could have caused diarrhea.does recall long time one of her Doctor's thought she had irritable bowel syndrome.on chart review on Imodium which she states took two early this morning.    Past Medical History:  Diagnosis Date  . Breast cancer ([place])   . Cardiac pacemaker in situ 04/01/2004   Original implant 04/01/04 Dr. Doreatha Lew Indications syncope with Mobitz 2 heart block  RV lead Guidant 4470 52 cm bipolar lead, SN [id number] atrial lead Guidant bipolar serial #[id number].  Medtronic EnPulse Model number [id number], SN PNB [id number]   . Carotid bruit 10/25/2010   right  . Diverticulosis   . H/O syncope   . 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 Hiatal hernia   . HTN (hypertension), benign   . Hyperlipidemia   . Melanoma of back ([place]) 2009  . Mobitz II   . Osteoporosis    Past Surgical History:  Procedure Laterality Date  . BREAST BIOPSY  1960   Left  . BREAST LUMPECTOMY  1960   left 2017  . BREAST LUMPECTOMY WITH RADIOACTIVE SEED LOCALIZATION Left 09/10/2015   Procedure: BREAST LUMPECTOMY WITH RADIOACTIVE SEED LOCALIZATION;  Surgeon: Excell Seltzer, MD;  Location: [place];  Service: General;  Laterality: Left;  . CATARACT EXTRACTION Bilateral 2002   Dr. Katy Fitch  . CATARACT EXTRACTION, BILATERAL  2001  . INSERT / REPLACE / REMOVE PACEMAKER    . LOM HEMOTYMPANUM THROAT & SINUS BX  1977  . PACEMAKER GENERATOR CHANGE N/A 08/16/2012  Procedure: PACEMAKER GENERATOR CHANGE;  Surgeon: Deboraha Sprang, MD;  Location: [facility] CATH LAB;  Service: Cardiovascular;  Laterality: N/A;  . PACEMAKER INSERTION  2006   Medtronic DDDR E2DRO1, SERIAL #  [id number]  . surgery of ear      Allergies  Allergen Reactions  . Adhesive [Tape] Itching and Rash    Please use "paper" tape  . Codeine Nausea Only  . Triamterene-Hctz Other (See Comments)    fatigue  . Vasotec Diarrhea    Outpatient Encounter Medications as of 11/07/2019  Medication Sig  . acetaminophen  (TYLENOL) 500 MG tablet Take 500 mg by mouth every 6 (six) hours as needed.  Marland Kitchen aspirin 81 MG tablet Take 81 mg by mouth daily.   . beta carotene w/minerals (OCUVITE) tablet Take 1 tablet by mouth daily.  . Cholecalciferol (VITAMIN D-3) 1000 units CAPS Take 1,000 Units by mouth daily.  . hydrochlorothiazide (MICROZIDE) 12.5 MG capsule TAKE (1) CAPSULE DAILY.  Marland Kitchen levothyroxine (SYNTHROID) 50 MCG tablet TAKE 1 TABLET ONCE DAILY.  Marland Kitchen lisinopril (ZESTRIL) 10 MG tablet TAKE 1 TABLET ONCE DAILY.  Marland Kitchen loperamide (IMODIUM A-D) 2 MG tablet Take 2 mg by mouth daily as needed for diarrhea or loose stools.  . metoprolol succinate (TOPROL-XL) 25 MG 24 hr tablet TAKE 1 TABLET ONCE DAILY.  . pravastatin (PRAVACHOL) 20 MG tablet Take one tablet by mouth once daily   No facility-administered encounter medications on file as of 11/07/2019.    Review of Systems  Constitutional: Negative for appetite change, chills, fatigue and fever.  HENT: Positive for hearing loss. Negative for congestion, rhinorrhea, sinus pressure, sinus pain, sneezing and sore throat.        No loss of taste or smell   Respiratory: Negative for cough, chest tightness, shortness of breath and wheezing.   Cardiovascular: Negative for chest pain, palpitations and leg swelling.  Gastrointestinal: Positive for diarrhea. Negative for abdominal distention, abdominal pain, blood in stool, constipation, nausea and vomiting.  Skin: Negative for color change, pallor and rash.  Neurological: Negative for dizziness, speech difficulty, weakness, light-headedness and headaches.  Psychiatric/Behavioral: Negative for agitation, confusion and sleep disturbance. The patient is not nervous/anxious.     Immunization History  Administered Date(s) Administered  . Fluad Quad(high Dose 65+) 12/10/2018  . Influenza Split 12/10/2012  . Influenza-Unspecified 12/11/2013, 11/28/2014, 12/08/2015, 12/04/2016, 11/27/2017  . Moderna SARS-COVID-2 Vaccination 03/03/2019,  03/31/2019  . Pneumococcal Conjugate-13 01/06/2014, 01/08/2014  . Pneumococcal Polysaccharide-23 04/20/2009  . Tdap 07/03/2000, 05/02/2011  . Zoster 10/20/2009   Pertinent  Health Maintenance Due  Topic Date Due  . INFLUENZA VACCINE  09/28/2019  . PNA vac Low Risk Adult  Completed  . DEXA SCAN  Addressed   Fall Risk  11/07/2019 10/30/2019 07/25/2019 07/10/2019 07/29/2018  Falls in the past year? 0 0 0 0 0  Number falls in past yr: 0 0 0 0 -  Injury with Fall? 0 - - - -  Risk for fall due to : - - - - -    There were no vitals filed for this visit. There is no height or weight on file to calculate BMI. Physical Exam Unable to complete on Telephone visit   Labs reviewed: Recent Labs    07/22/19 0715  NA 139  K 3.7  CL 103  CO2 26  GLUCOSE 103*  BUN 34*  CREATININE 1.30*  CALCIUM 8.9   Recent Labs    07/22/19 0715  AST 13  ALT 10  BILITOT 0.9  PROT 5.7*   Recent Labs    07/22/19 0715  WBC 11.0*  NEUTROABS 6,424  HGB 13.7  HCT 42.5  MCV 91.4  PLT 226   Lab Results  Component Value Date   TSH 0.84 07/22/2019   No results found for: HGBA1C Lab Results  Component Value Date   CHOL 143 07/22/2019   HDL 62 07/22/2019   LDLCALC 64 07/22/2019   TRIG 91 07/22/2019   CHOLHDL 2.3 07/22/2019    Significant Diagnostic Results in last 30 days:  No results found.  Assessment/Plan   Diarrhea, unspecified type Reports no fever.Unclear etiology though has had similar symptoms in the past had imodiun at hand.Advised to continue on imodium not to exceed 16 mg per day. -   Advised to drink Pedialyte or Gatorade after every loose stool. - continue to drink water 6-8 glasses daily  - Bland diet for the next 24 hrs then advance as tolerated states has a friend who will get oatmeal,rice etc - Notify provider or go to ED/urgent care if symptoms worsen or fail to improve   Family/ staff Communication: Reviewed plan of care with patient verbalized  understanding.  Labs/tests ordered: None   Next Appointment: As needed if symptoms worsen or fail to improve.   I connected with  [name] on 11/07/19 by a video enabled telemedicine application and verified that I am speaking with the correct person using two identifiers.   I discussed the limitations of evaluation and management by telemedicine. The patient expressed understanding and agreed to proceed.   Spent 12 minutes of non-face to face with patient    Sandrea Hughs, NP

## 2019-11-11 ENCOUNTER — Telehealth: Payer: Self-pay

## 2019-11-11 NOTE — Telephone Encounter (Signed)
I spoke with Nuala Alpha Engineer, petroleum) and she plans to further follow-up with staff at [facility] and with Dr.Gupta. I am awaiting reply before I call patient back.

## 2019-11-11 NOTE — Telephone Encounter (Signed)
Incoming call received form patient requesting appointment at [facility] for left leg pain since Sunday. Patient rates pain 8 on a scale of 1-10. Patient seen nurse at [facility] and she recommended tylenol in which patient took. Tylenol provided some relief.   Patient aware we do not have any available appointments at [facility] this week or next week. Patient was upset about this and asked that I send a message to Dr.Gupta to see if there is a way she can be accommodated. Patient refused appointment here at [facility].  Please advise

## 2019-11-11 NOTE — Telephone Encounter (Signed)
Nuala Alpha spoke with Ludger Nutting (Nurse @ [facility]), they have orchstrated transportation and discussed with patient. Patient agreed to be seen tomorrow at the [facility] clinic

## 2019-11-11 NOTE — Telephone Encounter (Signed)
I called patient to give her a status update and let her know I am still awaiting reply form Dr.Gupta. Patient was offered to go to [facility] clinic and refused.

## 2019-11-11 NOTE — Telephone Encounter (Signed)
I don't know what else I can do . I don't have clinic in [place] for this Month.

## 2019-11-12 ENCOUNTER — Non-Acute Institutional Stay: Payer: Medicare Other | Admitting: Internal Medicine

## 2019-11-12 ENCOUNTER — Other Ambulatory Visit: Payer: Self-pay

## 2019-11-12 ENCOUNTER — Encounter: Payer: Self-pay | Admitting: Internal Medicine

## 2019-11-12 VITALS — BP 132/66 | HR 95 | Temp 97.2°F | Ht 63.0 in | Wt 136.4 lb

## 2019-11-12 DIAGNOSIS — R197 Diarrhea, unspecified: Secondary | ICD-10-CM | POA: Diagnosis not present

## 2019-11-12 DIAGNOSIS — M25562 Pain in left knee: Secondary | ICD-10-CM | POA: Diagnosis not present

## 2019-11-12 DIAGNOSIS — I10 Essential (primary) hypertension: Secondary | ICD-10-CM | POA: Diagnosis not present

## 2019-11-12 DIAGNOSIS — E039 Hypothyroidism, unspecified: Secondary | ICD-10-CM | POA: Diagnosis not present

## 2019-11-12 DIAGNOSIS — N184 Chronic kidney disease, stage 4 (severe): Secondary | ICD-10-CM

## 2019-11-12 MED ORDER — MELOXICAM 7.5 MG PO TABS
7.5000 mg | ORAL_TABLET | Freq: Every day | ORAL | 0 refills | Status: AC
Start: 2019-11-12 — End: 2019-11-19

## 2019-11-12 NOTE — Progress Notes (Signed)
Location: [place] of Service:  Clinic (12)  Provider:   Code Status:  Goals of Care:  Advanced Directives 11/07/2019  Does Patient Have a Medical Advance Directive? Yes  Type of Advance Directive Living will;Healthcare Power of Attorney  Does patient want to make changes to medical advance directive? No - Guardian declined  Copy of [place] in Chart? No - copy requested  Pre-existing out of facility DNR order (yellow form or pink MOST form) -     Chief Complaint  Patient presents with  . Acute Visit    [place] resident returns to the clinic for left leg pain.     HPI: Patient is a 86 y.o. female seen today for an acute visit for Left Leg Pain  Patient has h/o Hypertension, H/o Atrial Fibrillation, S/P Pacemaker,CKD, Arthritis  Came with Acute onset of Left Knee Pain. Noticed few days ago. No Fall. No swelling C/o Pain when tries to get up or on Flexion. Walking with her walker Took Tylenol Last night and says Pain got better.  Had diarrhea few weeks ago which is now resolved  Past Medical History:  Diagnosis Date  . Breast cancer ([place])   . Cardiac pacemaker in situ 04/01/2004   Original implant 04/01/04 Dr. Doreatha Lew Indications syncope with Mobitz 2 heart block  RV lead Guidant 4470 52 cm bipolar lead, SN [id number] atrial lead Guidant bipolar serial #[id number].  Medtronic EnPulse Model number [id number], SN PNB [id number]   . Carotid bruit 10/25/2010   right  . Diverticulosis   . H/O syncope   . 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 Hiatal hernia   . HTN (hypertension), benign   . Hyperlipidemia   . Melanoma of back ([place]) 2009  . Mobitz II   . Osteoporosis     Past Surgical History:  Procedure Laterality Date  . BREAST BIOPSY  1960   Left  . BREAST LUMPECTOMY  1960   left 2017  . BREAST LUMPECTOMY WITH RADIOACTIVE SEED LOCALIZATION Left 09/10/2015   Procedure: BREAST LUMPECTOMY WITH RADIOACTIVE SEED  LOCALIZATION;  Surgeon: Excell Seltzer, MD;  Location: [place];  Service: General;  Laterality: Left;  . CATARACT EXTRACTION Bilateral 2002   Dr. Katy Fitch  . CATARACT EXTRACTION, BILATERAL  2001  . INSERT / REPLACE / REMOVE PACEMAKER    . LOM HEMOTYMPANUM THROAT & SINUS BX  1977  . PACEMAKER GENERATOR CHANGE N/A 08/16/2012   Procedure: PACEMAKER GENERATOR CHANGE;  Surgeon: Deboraha Sprang, MD;  Location: [facility] CATH LAB;  Service: Cardiovascular;  Laterality: N/A;  . PACEMAKER INSERTION  2006   Medtronic DDDR E2DRO1, SERIAL #  [id number]  . surgery of ear      Allergies  Allergen Reactions  . Adhesive [Tape] Itching and Rash    Please use "paper" tape  . Codeine Nausea Only  . Triamterene-Hctz Other (See Comments)    fatigue  . Vasotec Diarrhea    Outpatient Encounter Medications as of 11/12/2019  Medication Sig  . acetaminophen (TYLENOL) 500 MG tablet Take 500 mg by mouth every 6 (six) hours as needed.  Marland Kitchen aspirin 81 MG tablet Take 81 mg by mouth daily.   . beta carotene w/minerals (OCUVITE) tablet Take 1 tablet by mouth daily.  . Cholecalciferol (VITAMIN D-3) 1000 units CAPS Take 1,000 Units by mouth daily.  . hydrochlorothiazide (MICROZIDE) 12.5 MG capsule TAKE (1) CAPSULE DAILY.  Marland Kitchen levothyroxine (SYNTHROID) 50 MCG tablet TAKE 1 TABLET  ONCE DAILY.  Marland Kitchen lisinopril (ZESTRIL) 10 MG tablet TAKE 1 TABLET ONCE DAILY.  Marland Kitchen loperamide (IMODIUM A-D) 2 MG tablet Take 2 mg by mouth daily as needed for diarrhea or loose stools.  . metoprolol succinate (TOPROL-XL) 25 MG 24 hr tablet TAKE 1 TABLET ONCE DAILY.  . pravastatin (PRAVACHOL) 20 MG tablet Take one tablet by mouth once daily  . meloxicam (MOBIC) 7.5 MG tablet Take 1 tablet (7.5 mg total) by mouth daily for 7 days.   No facility-administered encounter medications on file as of 11/12/2019.    Review of Systems:  Review of Systems  Constitutional: Positive for activity change.  HENT: Negative.   Respiratory: Negative.   Cardiovascular:  Negative.   Gastrointestinal: Positive for constipation.  Genitourinary: Negative.   Musculoskeletal: Positive for arthralgias, gait problem and myalgias.  Neurological: Negative for weakness.  Psychiatric/Behavioral: Negative.     Health Maintenance  Topic Date Due  . INFLUENZA VACCINE  09/28/2019  . TETANUS/TDAP  05/01/2021  . COVID-19 Vaccine  Completed  . PNA vac Low Risk Adult  Completed  . DEXA SCAN  Addressed    Physical Exam: Vitals:   11/12/19 1434  BP: 132/66  Pulse: 95  Temp: (!) 97.2 F (36.2 C)  SpO2: 98%  Weight: 136 lb 6.4 oz (61.9 kg)  Height: 5' 3"  (1.6 m)   Body mass index is 24.16 kg/m. Physical Exam Vitals reviewed.  Constitutional:      Appearance: Normal appearance.  HENT:     Head: Normocephalic.     Nose: Nose normal.     Mouth/Throat:     Mouth: Mucous membranes are moist.     Pharynx: Oropharynx is clear.  Eyes:     Pupils: Pupils are equal, round, and reactive to light.  Cardiovascular:     Rate and Rhythm: Normal rate and regular rhythm.     Pulses: Normal pulses.  Pulmonary:     Effort: Pulmonary effort is normal.     Breath sounds: Normal breath sounds.  Abdominal:     General: Abdomen is flat. Bowel sounds are normal.     Palpations: Abdomen is soft.  Musculoskeletal:     Cervical back: Neck supple.     Comments: Left Knee had Chronic Arthritis changes. Not Swollen.No Effusion . No redness. No warmth  Does have tenderness in Anterior top part of Knee. Does have Pain on Flexion Able to stand up and walk  Skin:    General: Skin is warm.  Neurological:     General: No focal deficit present.     Mental Status: She is alert.     Labs reviewed: Basic Metabolic Panel: Recent Labs    07/22/19 0715  NA 139  K 3.7  CL 103  CO2 26  GLUCOSE 103*  BUN 34*  CREATININE 1.30*  CALCIUM 8.9  TSH 0.84   Liver Function Tests: Recent Labs    07/22/19 0715  AST 13  ALT 10  BILITOT 0.9  PROT 5.7*   No results for input(s):  LIPASE, AMYLASE in the last 8760 hours. No results for input(s): AMMONIA in the last 8760 hours. CBC: Recent Labs    07/22/19 0715  WBC 11.0*  NEUTROABS 6,424  HGB 13.7  HCT 42.5  MCV 91.4  PLT 226   Lipid Panel: Recent Labs    07/22/19 0715  CHOL 143  HDL 62  LDLCALC 64  TRIG 91  CHOLHDL 2.3   No results found for: HGBA1C  Procedures since  last visit: No results found.  Assessment/Plan 1. Acute pain of left knee ? Arthritis with Patellofemoral Pain/ Tendinitis Will do Meloxicam 7.5 mg QD for 7 Days Tylenol Q 8 Prn for pain If not better in 2 weeks to call us. Possible Xray / Ortho Consult  2. Diarrhea, unspecified type  Resolved now  3. Essential hypertension Controlled on Toprol and HCTZ and Lisinopril  4. Hypothyroidism, unspecified type  TSH was normal in 5/21  5. 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

## 2019-11-12 NOTE — Patient Instructions (Signed)
Can take Tylenol 650 mg three times a day Take Meloxicam medicine with food  Call our office if pain not better in 2 weeks

## 2019-11-21 ENCOUNTER — Other Ambulatory Visit: Payer: Self-pay | Admitting: Nurse Practitioner

## 2019-11-21 NOTE — Telephone Encounter (Signed)
Pharmacy faxed refill Pended Rx and sent to Dr. Lyndel Safe for approval due to [place].

## 2019-12-02 ENCOUNTER — Other Ambulatory Visit: Payer: Self-pay

## 2019-12-02 ENCOUNTER — Ambulatory Visit (INDEPENDENT_AMBULATORY_CARE_PROVIDER_SITE_OTHER): Payer: Medicare Other | Admitting: Ophthalmology

## 2019-12-02 ENCOUNTER — Encounter (INDEPENDENT_AMBULATORY_CARE_PROVIDER_SITE_OTHER): Payer: Self-pay | Admitting: Ophthalmology

## 2019-12-02 DIAGNOSIS — H353211 Exudative age-related macular degeneration, right eye, with active choroidal neovascularization: Secondary | ICD-10-CM

## 2019-12-02 DIAGNOSIS — H353124 Nonexudative age-related macular degeneration, left eye, advanced atrophic with subfoveal involvement: Secondary | ICD-10-CM | POA: Diagnosis not present

## 2019-12-02 MED ORDER — AFLIBERCEPT 2MG/0.05ML IZ SOLN FOR KALEIDOSCOPE
2.0000 mg | INTRAVITREAL | Status: AC | PRN
  Administered 2019-12-02: 2 mg via INTRAVITREAL

## 2019-12-02 NOTE — Patient Instructions (Signed)
Patient is asked to report new onset visual acuity decline or distortion in either eye

## 2019-12-02 NOTE — Assessment & Plan Note (Signed)
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 The results of the AREDS 2 study was discussed with the patient. A diet rich in dark leafy [place] vegetables was advised and specific recommendations were made regarding supplements with AREDS 2 formulation . Control of hypertension and serum cholesterol may slow the disease. Smoking cessation is mandatory to slow the disease and diminish the risk of progressing to wet age related macular degeneration. The patient was instructed in the use of an [place] Grid and was told to return immediately for any changes in the Grid. Stressed to the patient do not rub eyes 

## 2019-12-02 NOTE — Assessment & Plan Note (Signed)
Stable and improved on Eylea, 8-week follow-up today, will extend examination interval to 10 weeks next

## 2019-12-02 NOTE — Progress Notes (Signed)
12/02/2019     CHIEF COMPLAINT Patient presents for Retina Follow Up   HISTORY OF PRESENT ILLNESS: [name] [name] is a 86 y.o. female who presents to the clinic today for:   HPI    Retina Follow Up    Patient presents with  Wet AMD.  In right eye.  Severity is moderate.  Duration of 8 weeks.  Since onset it is stable.  I, the attending physician,  performed the HPI with the patient and updated documentation appropriately.          Comments    8 Week Wet AMD f\u OD. Possible Eylea OD. OCT  Pt states she has not noticed any changes in vision. Pt had a fall about 2 weeks ago.       Last edited by Tilda Franco on 12/02/2019  9:53 AM. (History)      Referring physician: Mast, Man X, NP [address],  [place] [address]  HISTORICAL INFORMATION:   Selected notes from the MEDICAL RECORD NUMBER       CURRENT MEDICATIONS: No current outpatient medications on file. (Ophthalmic Drugs)   No current facility-administered medications for this visit. (Ophthalmic Drugs)   Current Outpatient Medications (Other)  Medication Sig  . acetaminophen (TYLENOL) 500 MG tablet Take 500 mg by mouth every 6 (six) hours as needed.  Marland Kitchen aspirin 81 MG tablet Take 81 mg by mouth daily.   . beta carotene w/minerals (OCUVITE) tablet Take 1 tablet by mouth daily.  . Cholecalciferol (VITAMIN D-3) 1000 units CAPS Take 1,000 Units by mouth daily.  . hydrochlorothiazide (MICROZIDE) 12.5 MG capsule TAKE (1) CAPSULE DAILY.  Marland Kitchen levothyroxine (SYNTHROID) 50 MCG tablet TAKE 1 TABLET ONCE DAILY.  Marland Kitchen lisinopril (ZESTRIL) 10 MG tablet TAKE 1 TABLET ONCE DAILY.  Marland Kitchen loperamide (IMODIUM A-D) 2 MG tablet Take 2 mg by mouth daily as needed for diarrhea or loose stools.  . metoprolol succinate (TOPROL-XL) 25 MG 24 hr tablet TAKE 1 TABLET ONCE DAILY.  . pravastatin (PRAVACHOL) 20 MG tablet Take one tablet by mouth once daily   No current facility-administered medications for this visit. (Other)       REVIEW OF SYSTEMS:    ALLERGIES Allergies  Allergen Reactions  . Adhesive [Tape] Itching and Rash    Please use "paper" tape  . Codeine Nausea Only  . Triamterene-Hctz Other (See Comments)    fatigue  . Vasotec Diarrhea    PAST MEDICAL HISTORY Past Medical History:  Diagnosis Date  . Breast cancer ([place])   . Cardiac pacemaker in situ 04/01/2004   Original implant 04/01/04 Dr. Doreatha Lew Indications syncope with Mobitz 2 heart block  RV lead Guidant 4470 52 cm bipolar lead, SN [id number] atrial lead Guidant bipolar serial #[id number].  Medtronic EnPulse Model number [id number], SN PNB [id number]   . Carotid bruit 10/25/2010   right  . Diverticulosis   . H/O syncope   . 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 Hiatal hernia   . HTN (hypertension), benign   . Hyperlipidemia   . Melanoma of back ([place]) 2009  . Mobitz II   . Osteoporosis    Past Surgical History:  Procedure Laterality Date  . BREAST BIOPSY  1960   Left  . BREAST LUMPECTOMY  1960   left 2017  . BREAST LUMPECTOMY WITH RADIOACTIVE SEED LOCALIZATION Left 09/10/2015   Procedure: BREAST LUMPECTOMY WITH RADIOACTIVE SEED LOCALIZATION;  Surgeon: Excell Seltzer, MD;  Location: [facility];  Service: General;  Laterality: Left;  . CATARACT EXTRACTION Bilateral 2002   Dr. Katy Fitch  . CATARACT EXTRACTION, BILATERAL  2001  . INSERT / REPLACE / REMOVE PACEMAKER    . LOM HEMOTYMPANUM THROAT & SINUS BX  1977  . PACEMAKER GENERATOR CHANGE N/A 08/16/2012   Procedure: PACEMAKER GENERATOR CHANGE;  Surgeon: Deboraha Sprang, MD;  [facility] CATH LAB;  Service: Cardiovascular;  Laterality: N/A;  . PACEMAKER INSERTION  2006   Medtronic DDDR E2DRO1, SERIAL #  [id number]  . surgery of ear      FAMILY HISTORY Family History  Problem Relation Age of Onset  . Stroke Mother 76  . Kidney disease Father 22  . Cirrhosis Son     SOCIAL HISTORY Social History   Tobacco Use  . Smoking status: Former Smoker    Packs/day: 0.33    Years:  10.00    Pack years: 3.30    Types: Cigarettes    Quit date: 02/27/1946    Years since quitting: 73.8  . Smokeless tobacco: Never Used  Vaping Use  . Vaping Use: Never used  Substance Use Topics  . Alcohol use: Yes    Alcohol/week: 7.0 standard drinks    Types: 7 Glasses of wine per week    Comment: wine  . Drug use: No         OPHTHALMIC EXAM: Base Eye Exam    Visual Acuity (Snellen - Linear)      Right Left   Dist cc 20/50 + CF @ 2'   Dist ph cc NI NI   Correction: Glasses       Tonometry (Tonopen, 9:57 AM)      Right Left   Pressure 10 12       Pupils      Pupils Dark Light Shape React APD   Right PERRL 4 4 Round Minimal None   Left PERRL 4 4 Round Minimal None       Visual Fields (Counting fingers)      Left Right    Full Full       Neuro/Psych    Oriented x3: Yes   Mood/Affect: Normal       Dilation    Right eye: 1.0% Mydriacyl, 2.5% Phenylephrine @ 9:57 AM        Slit Lamp and Fundus Exam    External Exam      Right Left   External Normal Normal       Slit Lamp Exam      Right Left   Lids/Lashes Normal Normal   Conjunctiva/Sclera White and quiet White and quiet   Cornea Clear Clear   Anterior Chamber Deep and quiet Deep and quiet   Iris Round and reactive Round and reactive   Lens Posterior chamber intraocular lens Posterior chamber intraocular lens   Anterior Vitreous Normal Normal       Fundus Exam      Right Left   Posterior Vitreous Posterior vitreous detachment    Disc Normal    C/D Ratio 0.5    Macula Atrophy, Retinal pigment epithelial atrophy, Age related macular degeneration, Early age related macular degeneration, [place], no exudates, no hemorrhage, no macular thickening    Vessels Normal    Periphery Normal           IMAGING AND PROCEDURES  Imaging and Procedures for 12/02/19  OCT, Retina - OU - Both Eyes       Right Eye Quality was good. Central Foveal Thickness: 274. Findings include  abnormal foveal contour,  no SRF, inner retinal atrophy, subretinal scarring.   Left Eye Quality was good. Scan locations included subfoveal. Central Foveal Thickness: 268. Progression has been stable. Findings include abnormal foveal contour, no SRF, no IRF.   Notes No signs of CNVM OD yet with outer retinal thickening at the level of Bruch's membrane, subretinal scarring  OS with atrophic ARMD dry       Intravitreal Injection, Pharmacologic Agent - OD - Right Eye       Time Out 12/02/2019. 11:18 AM. Confirmed correct patient, procedure, site, and patient consented.   Anesthesia Topical anesthesia was used. Anesthetic medications included Akten 3.5%.   Procedure Preparation included Tobramycin 0.3%, 10% betadine to eyelids, 5% betadine to ocular surface. A 30 gauge needle was used.   Injection:  2 mg aflibercept Alfonse Flavors) SOLN   NDC: A3590391, Lot: [id number]   Route: Intravitreal, Site: Right Eye, Waste: 0 mg  Post-op Post injection exam found visual acuity of at least counting fingers. The patient tolerated the procedure well. There were no complications. Post injection medications were not given.                 ASSESSMENT/PLAN:  Exudative age-related macular degeneration of right eye with active choroidal neovascularization (HCC) Stable and improved on Eylea, 8-week follow-up today, will extend examination interval to 10 weeks next      ICD-10-CM   1. Exudative age-related macular degeneration of right eye with active choroidal neovascularization (HCC)  H35.3211 OCT, Retina - OU - Both Eyes    Intravitreal Injection, Pharmacologic Agent - OD - Right Eye    aflibercept (EYLEA) SOLN 2 mg  2. 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  Repeat intravitreal Eylea OD today, at 8-week interval and extend examination interval to 10 weeks next  2.  3.  Ophthalmic Meds Ordered this visit:  Meds ordered this encounter   Medications  . aflibercept (EYLEA) SOLN 2 mg       Return in about 10 weeks (around 02/10/2020) for OD, dilate, EYLEA OCT.  Patient Instructions  Patient is asked to report new onset visual acuity decline or distortion in either eye    Explained the diagnoses, plan, and follow up with the patient and they expressed understanding.  Patient expressed understanding of the importance of proper follow up care.   Clent Demark Tracey Stewart M.D. Diseases & Surgery of the Retina and Vitreous Retina & Diabetic [place] 12/02/19     Abbreviations: M myopia (nearsighted); A astigmatism; H hyperopia (farsighted); P presbyopia; Mrx spectacle prescription;  CTL contact lenses; OD right eye; OS left eye; OU both eyes  XT exotropia; ET esotropia; PEK punctate epithelial keratitis; PEE punctate epithelial erosions; DES dry eye syndrome; MGD meibomian gland dysfunction; ATs artificial tears; PFAT's preservative free artificial tears; [place] nuclear sclerotic cataract; PSC posterior subcapsular cataract; ERM epi-retinal membrane; PVD posterior vitreous detachment; RD retinal detachment; DM diabetes mellitus; DR diabetic retinopathy; NPDR non-proliferative diabetic retinopathy; PDR proliferative diabetic retinopathy; CSME clinically significant macular edema; DME diabetic macular edema; dbh dot blot hemorrhages; CWS cotton wool spot; POAG primary open angle glaucoma; C/D cup-to-disc ratio; HVF humphrey visual field; GVF goldmann visual field; OCT optical coherence tomography; IOP intraocular pressure; BRVO Branch retinal vein occlusion; CRVO central retinal vein occlusion; CRAO central retinal artery occlusion; BRAO branch retinal artery occlusion; RT retinal tear; SB scleral buckle; PPV pars plana vitrectomy; VH Vitreous hemorrhage; PRP panretinal laser  photocoagulation; IVK intravitreal kenalog; VMT vitreomacular traction; MH Macular hole;  NVD neovascularization of the disc; NVE neovascularization elsewhere; AREDS age  related eye disease study; ARMD age related macular degeneration; POAG primary open angle glaucoma; EBMD epithelial/anterior basement membrane dystrophy; ACIOL anterior chamber intraocular lens; IOL intraocular lens; PCIOL posterior chamber intraocular lens; Phaco/IOL phacoemulsification with intraocular lens placement; [place] photorefractive keratectomy; LASIK laser assisted in situ keratomileusis; HTN hypertension; DM diabetes mellitus; COPD chronic obstructive pulmonary disease

## 2019-12-09 ENCOUNTER — Other Ambulatory Visit: Payer: Self-pay | Admitting: Nurse Practitioner

## 2019-12-09 ENCOUNTER — Encounter: Payer: Self-pay | Admitting: Nurse Practitioner

## 2019-12-09 NOTE — Telephone Encounter (Signed)
Allergy/contraindication sme up when trying to fill medication. Medication pended and sent to [facility] for approval.

## 2019-12-10 ENCOUNTER — Other Ambulatory Visit: Payer: Self-pay | Admitting: Nurse Practitioner

## 2019-12-10 ENCOUNTER — Ambulatory Visit (INDEPENDENT_AMBULATORY_CARE_PROVIDER_SITE_OTHER): Payer: Medicare Other

## 2019-12-10 DIAGNOSIS — Z23 Encounter for immunization: Secondary | ICD-10-CM | POA: Diagnosis not present

## 2019-12-10 DIAGNOSIS — I442 Atrioventricular block, complete: Secondary | ICD-10-CM

## 2019-12-11 ENCOUNTER — Encounter: Payer: Self-pay | Admitting: Nurse Practitioner

## 2019-12-11 ENCOUNTER — Other Ambulatory Visit: Payer: Self-pay | Admitting: Nurse Practitioner

## 2019-12-11 ENCOUNTER — Non-Acute Institutional Stay: Payer: Medicare Other | Admitting: Nurse Practitioner

## 2019-12-11 ENCOUNTER — Other Ambulatory Visit: Payer: Self-pay

## 2019-12-11 DIAGNOSIS — I10 Essential (primary) hypertension: Secondary | ICD-10-CM | POA: Diagnosis not present

## 2019-12-11 DIAGNOSIS — E039 Hypothyroidism, unspecified: Secondary | ICD-10-CM | POA: Diagnosis not present

## 2019-12-11 DIAGNOSIS — M8949 Other hypertrophic osteoarthropathy, multiple sites: Secondary | ICD-10-CM | POA: Diagnosis not present

## 2019-12-11 DIAGNOSIS — S62101A Fracture of unspecified carpal bone, right wrist, initial encounter for closed fracture: Secondary | ICD-10-CM | POA: Insufficient documentation

## 2019-12-11 DIAGNOSIS — N184 Chronic kidney disease, stage 4 (severe): Secondary | ICD-10-CM | POA: Diagnosis not present

## 2019-12-11 DIAGNOSIS — M25531 Pain in right wrist: Secondary | ICD-10-CM | POA: Insufficient documentation

## 2019-12-11 DIAGNOSIS — M159 Polyosteoarthritis, unspecified: Secondary | ICD-10-CM

## 2019-12-11 NOTE — Assessment & Plan Note (Signed)
HTN, blood pressure is controlled on HCTZ 12.5mg  qd, Lisinopril 10mg  qd, Metoprolol 25mg  qd.

## 2019-12-11 NOTE — Progress Notes (Signed)
Location:    clinic [place]   Place of Service:  Clinic (12) Provider: Marlana Latus NP  Code Status: DNR Goals of Care: [place] 11/07/2019  Does Patient Have a Medical Advance Directive? Yes  Type of Advance Directive Living will;Healthcare Power of Attorney  Does patient want to make changes to medical advance directive? No - Guardian declined  Copy of [place] in Chart? No - copy requested  Pre-existing out of facility DNR order (yellow form or pink MOST form) -     Chief Complaint  Patient presents with  . Medical Management of Chronic Issues    Patient returns to the clinic for follow up.     HPI: Patient is a 86 y.o. female seen today for medical management of chronic diseases.    Right wrist pain/swelling/warmth, but able to ROM or weight bearing since fall 11/14/19 at home.   Left knee pain, resolved. saw Dr. Lyndel Safe 11/12/19, took Meloxicam 7.69m x 7 days, Tylenol q8hr prn   HTN, blood pressure is controlled on HCTZ 12.526mqd, Lisinopril 1079md, Metoprolol 86m70m.              Hypothyroidism, stable, on Levothyroxine 50mc102m. TSH 0.84 07/22/19             CKD creat 1.3 07/22/19  Past Medical History:  Diagnosis Date  . Breast cancer (HCC) [place] Cardiac pacemaker in situ 04/01/2004   Original implant 04/01/04 Dr. TennaDoreatha Lewcations syncope with Mobitz 2 heart block  RV lead Guidant 4470 52 cm bipolar lead, SN [id number] lead Guidant bipolar serial #[id number] EnPulse Model number [id number]. Carotid bruit 10/25/2010   right  . Diverticulosis   . H/O syncope   . 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 Hiatal hernia   . HTN (hypertension), benign   . Hyperlipidemia   . Melanoma of back (HCC) [place]9  . Mobitz II   . Osteoporosis     Past Surgical History:  Procedure Laterality Date  . BREAST BIOPSY  1960   Left  . BREAST LUMPECTOMY  1960   left 2017  . BREAST LUMPECTOMY WITH RADIOACTIVE SEED  LOCALIZATION Left 09/10/2015   Procedure: BREAST LUMPECTOMY WITH RADIOACTIVE SEED LOCALIZATION;  Surgeon: BenjaExcell Seltzer  [facility];  Laterality: Left;  . CATARACT EXTRACTION Bilateral 2002   Dr. GroatKaty FitchATARACT EXTRACTION, BILATERAL  2001  . INSERT / REPLACE / REMOVE PACEMAKER    . LOM HEMOTYMPANUM THROAT & SINUS BX  1977  . PACEMAKER GENERATOR CHANGE N/A 08/16/2012   Procedure: PACEMAKER GENERATOR CHANGE;  Surgeon: SteveDeboraha Sprang  [facility];  Service: Cardiovascular;  Laterality: N/A;  . PACEMAKER INSERTION  2006   Medtronic DDDR E2DRO1, SERIAL #  [id number] surgery of ear      Allergies  Allergen Reactions  . Adhesive [Tape] Itching and Rash    Please use "paper" tape  . Codeine Nausea Only  . Triamterene-Hctz Other (See Comments)    fatigue  . Vasotec Diarrhea    Allergies as of 12/11/2019      Reactions   Adhesive [tape] Itching, Rash   Please use "paper" tape   Codeine Nausea Only   Triamterene-hctz Other (See Comments)   fatigue   Vasotec Diarrhea      Medication List       Accurate as of December 11, 2019 11:59 PM. If you have any questions, ask your nurse or doctor.        aspirin 81 MG tablet Take 81 mg by mouth daily.   beta carotene w/minerals tablet Take 1 tablet by mouth daily.   hydrochlorothiazide 12.5 MG capsule Commonly known as: MICROZIDE TAKE (1) CAPSULE DAILY.   levothyroxine 50 MCG tablet Commonly known as: SYNTHROID TAKE 1 TABLET ONCE DAILY.   lisinopril 10 MG tablet Commonly known as: ZESTRIL TAKE 1 TABLET ONCE DAILY.   loperamide 2 MG tablet Commonly known as: IMODIUM A-D Take 2 mg by mouth daily as needed for diarrhea or loose stools.   metoprolol succinate 25 MG 24 hr tablet Commonly known as: TOPROL-XL TAKE 1 TABLET ONCE DAILY.   pravastatin 20 MG tablet Commonly known as: PRAVACHOL Take one tablet by mouth once daily   TYLENOL 500 MG tablet Generic drug:  acetaminophen Take 500 mg by mouth every 6 (six) hours as needed.   Vitamin D-3 25 MCG (1000 UT) Caps Take 1,000 Units by mouth daily.       Review of Systems:  Review of Systems  Constitutional: Negative for activity change, appetite change, fever and unexpected weight change.  HENT: Positive for hearing loss. Negative for congestion and voice change.        Hearing aids.   Eyes: Negative for visual disturbance.       S/p inj, macular degeneration  Respiratory: Negative for cough, shortness of breath and wheezing.   Cardiovascular: Negative for chest pain, palpitations and leg swelling.  Gastrointestinal: Negative for abdominal pain and constipation.  Genitourinary: Positive for frequency.       Every 2-3 hours at night  Musculoskeletal: Positive for arthralgias, gait problem and joint swelling.       R wrist pain  Skin: Negative for color change.       Lateral mid right lower leg surgical scar  Neurological: Negative for speech difficulty, weakness, light-headedness and headaches.       Tingling numbness in hands.   Psychiatric/Behavioral: Positive for sleep disturbance. Negative for agitation and behavioral problems.       Due to frequent urination at night.     Health Maintenance  Topic Date Due  . TETANUS/TDAP  05/01/2021  . INFLUENZA VACCINE  Completed  . COVID-19 Vaccine  Completed  . PNA vac Low Risk Adult  Completed  . DEXA SCAN  Addressed    Physical Exam: Vitals:   12/11/19 1506  BP: (!) 110/56  Pulse: 82  Temp: (!) 97.3 F (36.3 C)  SpO2: 95%  Weight: 139 lb (63 kg)  Height: _0  (1.6 m)   Body mass index is 24.62 kg/m. Physical Exam Vitals and nursing note reviewed.  Constitutional:      Appearance: Normal appearance.  HENT:     Head: Normocephalic and atraumatic.     Mouth/Throat:     Mouth: Mucous membranes are moist.  Eyes:     Extraocular Movements: Extraocular movements intact.     Conjunctiva/sclera: Conjunctivae normal.     Pupils:  Pupils are equal, round, and reactive to light.     Comments: Mild ectropion R+L lower eyelids  Cardiovascular:     Rate and Rhythm: Normal rate and regular rhythm.     Heart sounds: Murmur heard.   Pulmonary:     Breath sounds: No rales.  Abdominal:     Palpations: Abdomen is soft.     Tenderness: There is no abdominal tenderness.  Musculoskeletal:        General: Swelling, tenderness and signs of injury present. No deformity.     Cervical back: Normal range of motion and neck supple.     Right lower leg: No edema.     Left lower leg: No edema.     Comments: Trace edema RLE.  Ambulates with walker. Fullness left popliteal area. Right wrist swelling, pain, warmth, but no decreased ROM, able to bear weight such as using walker.   Skin:    General: Skin is warm and dry.     Findings: Bruising present.     Comments: Mild chronic venous insufficiency skin changes BLE. Left facial ecchymosis residual from fall 11/14/19  Neurological:     General: No focal deficit present.     Mental Status: She is alert and oriented to person, place, and time. Mental status is at baseline.     Motor: No weakness.     Coordination: Coordination normal.     Gait: Gait abnormal.  Psychiatric:        Mood and Affect: Mood normal.        Behavior: Behavior normal.        Thought Content: Thought content normal.        Judgment: Judgment normal.     Labs reviewed: Basic Metabolic Panel: Recent Labs    07/22/19 0715  NA 139  K 3.7  CL 103  CO2 26  GLUCOSE 103*  BUN 34*  CREATININE 1.30*  CALCIUM 8.9  TSH 0.84   Liver Function Tests: Recent Labs    07/22/19 0715  AST 13  ALT 10  BILITOT 0.9  PROT 5.7*   No results for input(s): LIPASE, AMYLASE in the last 8760 hours. No results for input(s): AMMONIA in the last 8760 hours. CBC: Recent Labs    07/22/19 0715  WBC 11.0*  NEUTROABS 6,424  HGB 13.7  HCT 42.5  MCV 91.4  PLT 226   Lipid Panel: Recent Labs    07/22/19 0715  CHOL  143  HDL 62  LDLCALC 64  TRIG 91  CHOLHDL 2.3   No results found for: HGBA1C  Procedures since last visit: CUP PACEART REMOTE DEVICE CHECK  Result Date: 12/12/2019 Scheduled remote reviewed. Normal device function.  2 new VHR appear short AT AF burden 0.2% Next remote 91 days. JM  Intravitreal Injection, Pharmacologic Agent - OD - Right Eye  Result Date: 12/02/2019 Time Out 12/02/2019. 11:18 AM. Confirmed correct patient, procedure, site, and patient consented. Anesthesia Topical anesthesia was used. Anesthetic medications included Akten 3.5%. Procedure Preparation included Tobramycin 0.3%, 10% betadine to eyelids, 5% betadine to ocular surface. A 30 gauge needle was used. Injection: 2 mg aflibercept Alfonse Flavors) SOLN   NDC: A3590391, Lot: [id number]   Route: Intravitreal, Site: Right Eye, Waste: 0 mg Post-op Post injection exam found visual acuity of at least counting fingers. The patient tolerated the procedure well. There were no complications. Post injection medications were not given.   OCT, Retina - OU - Both Eyes  Result Date: 12/02/2019 Right Eye Quality was good. Central Foveal Thickness: 274. Findings include abnormal foveal contour, no SRF, inner retinal atrophy, subretinal scarring. Left Eye Quality was good. Scan locations included subfoveal. Central Foveal Thickness: 268. Progression has been stable. Findings include abnormal foveal contour, no SRF, no IRF. Notes No signs of CNVM OD yet with outer retinal thickening at the level of Bruch's membrane, subretinal scarring OS with atrophic ARMD dry   Assessment/Plan  Osteoarthritis  Left knee pain, saw Dr. Lyndel Safe 11/12/19, took Meloxicam 7.22m x 7 days, Tylenol q8hr prn, ? X-ray vs Ortho   Essential hypertension HTN, blood pressure is controlled on HCTZ 12.567mqd, Lisinopril 1056md, Metoprolol 23m30m.    Hypothyroidism Hypothyroidism, stable, on Levothyroxine 50mc47m. TSH 0.84 07/22/19   Kidney disease, chronic, stage IV  (severe, EGFR 15-29 ml/min) (HCC) CKD creat 1.3 07/22/19   Right wrist pain Right wrist pain/swelling/warmth, but able to ROM or weight bearing since fall 11/14/19 at home.  Will X-ray R wrist 3 views, CBC/diff, CMP/eGFR, CRP, ESR, uric acid. Meloxicam 7.5mg q32m 7 days.    Labs/tests ordered:  X-ray R wrist 3 views, CBC/diff, CMP/eGFR, ESR, CRP, uric acid.   Next appt:  01/30/2020

## 2019-12-11 NOTE — Assessment & Plan Note (Signed)
Right wrist pain/swelling/warmth, but able to ROM or weight bearing since fall 11/14/19 at home.  Will X-ray R wrist 3 views, CBC/diff, CMP/eGFR, CRP, ESR, uric acid. Meloxicam 7.17m qd x 7 days.

## 2019-12-11 NOTE — Assessment & Plan Note (Signed)
Hypothyroidism, stable, on Levothyroxine 17mcg qd. TSH 0.84 07/22/19

## 2019-12-11 NOTE — Assessment & Plan Note (Signed)
Left knee pain, saw Dr. Lyndel Safe 11/12/19, took Meloxicam 7.5mg  x 7 days, Tylenol q8hr prn, ? X-ray vs Ortho

## 2019-12-11 NOTE — Assessment & Plan Note (Signed)
CKD creat 1.3 07/22/19

## 2019-12-11 NOTE — Telephone Encounter (Signed)
Pharmacy requested refill.  Pended Rx and sent to [facility] for approval due to HIGH ALERT Warning.  

## 2019-12-12 ENCOUNTER — Encounter: Payer: Self-pay | Admitting: Nurse Practitioner

## 2019-12-12 LAB — CUP PACEART REMOTE DEVICE CHECK
Battery Impedance: 1983 Ohm
Battery Remaining Longevity: 29 mo
Battery Voltage: 2.75 V
Brady Statistic AP VP Percent: 19 %
Brady Statistic AP VS Percent: 0 %
Brady Statistic AS VP Percent: 81 %
Brady Statistic AS VS Percent: 0 %
Date Time Interrogation Session: 20211013163951
Implantable Lead Implant Date: 20060203
Implantable Lead Implant Date: 20060203
Implantable Lead Location: 753859
Implantable Lead Location: 753860
Implantable Lead Model: 4469
Implantable Lead Model: 4470
Implantable Lead Serial Number: 458590
Implantable Lead Serial Number: 500613
Implantable Pulse Generator Implant Date: 20140620
Lead Channel Impedance Value: 423 Ohm
Lead Channel Impedance Value: 498 Ohm
Lead Channel Pacing Threshold Amplitude: 1 V
Lead Channel Pacing Threshold Amplitude: 1.25 V
Lead Channel Pacing Threshold Pulse Width: 0.4 ms
Lead Channel Pacing Threshold Pulse Width: 0.4 ms
Lead Channel Setting Pacing Amplitude: 2.5 V
Lead Channel Setting Pacing Amplitude: 2.5 V
Lead Channel Setting Pacing Pulse Width: 0.4 ms
Lead Channel Setting Sensing Sensitivity: 2.8 mV

## 2019-12-15 NOTE — Progress Notes (Signed)
Remote pacemaker transmission.   

## 2019-12-16 ENCOUNTER — Other Ambulatory Visit: Payer: Self-pay

## 2019-12-16 ENCOUNTER — Encounter: Payer: Self-pay | Admitting: Nurse Practitioner

## 2019-12-16 DIAGNOSIS — M25531 Pain in right wrist: Secondary | ICD-10-CM | POA: Diagnosis not present

## 2019-12-16 DIAGNOSIS — M109 Gout, unspecified: Secondary | ICD-10-CM | POA: Insufficient documentation

## 2019-12-17 LAB — COMPLETE METABOLIC PANEL WITH GFR
AG Ratio: 1.5 (calc) (ref 1.0–2.5)
ALT: 11 U/L (ref 6–29)
AST: 16 U/L (ref 10–35)
Albumin: 3.9 g/dL (ref 3.6–5.1)
Alkaline phosphatase (APISO): 84 U/L (ref 37–153)
BUN/Creatinine Ratio: 22 (calc) (ref 6–22)
BUN: 35 mg/dL — ABNORMAL HIGH (ref 7–25)
CO2: 29 mmol/L (ref 20–32)
Calcium: 10 mg/dL (ref 8.6–10.4)
Chloride: 101 mmol/L (ref 98–110)
Creat: 1.58 mg/dL — ABNORMAL HIGH (ref 0.60–0.88)
GFR, Est African American: 31 mL/min/{1.73_m2} — ABNORMAL LOW (ref >=60)
GFR, Est Non African American: 26 mL/min/{1.73_m2} — ABNORMAL LOW (ref >=60)
Globulin: 2.6 g/dL (calc) (ref 1.9–3.7)
Glucose, Bld: 97 mg/dL (ref 65–99)
Potassium: 4.2 mmol/L (ref 3.5–5.3)
Sodium: 140 mmol/L (ref 135–146)
Total Bilirubin: 0.7 mg/dL (ref 0.2–1.2)
Total Protein: 6.5 g/dL (ref 6.1–8.1)

## 2019-12-17 LAB — CBC WITH DIFFERENTIAL/PLATELET
Absolute Monocytes: 909 cells/uL (ref 200–950)
Basophils Absolute: 71 cells/uL (ref 0–200)
Basophils Relative: 0.7 %
Eosinophils Absolute: 354 cells/uL (ref 15–500)
Eosinophils Relative: 3.5 %
HCT: 41.3 % (ref 35.0–45.0)
Hemoglobin: 13.8 g/dL (ref 11.7–15.5)
Lymphs Abs: 3737 cells/uL (ref 850–3900)
MCH: 29.7 pg (ref 27.0–33.0)
MCHC: 33.4 g/dL (ref 32.0–36.0)
MCV: 89 fL (ref 80.0–100.0)
MPV: 12.1 fL (ref 7.5–12.5)
Monocytes Relative: 9 %
Neutro Abs: 5030 cells/uL (ref 1500–7800)
Neutrophils Relative %: 49.8 %
Platelets: 245 10*3/uL (ref 140–400)
RBC: 4.64 10*6/uL (ref 3.80–5.10)
RDW: 13 % (ref 11.0–15.0)
Total Lymphocyte: 37 %
WBC: 10.1 10*3/uL (ref 3.8–10.8)

## 2019-12-17 LAB — SEDIMENTATION RATE: Sed Rate: 9 mm/h (ref 0–30)

## 2019-12-17 LAB — URIC ACID: Uric Acid, Serum: 7.5 mg/dL — ABNORMAL HIGH (ref 2.5–7.0)

## 2019-12-17 LAB — C-REACTIVE PROTEIN: CRP: 2.9 mg/L (ref <8.0)

## 2019-12-19 ENCOUNTER — Other Ambulatory Visit: Payer: Self-pay

## 2019-12-19 DIAGNOSIS — M109 Gout, unspecified: Secondary | ICD-10-CM

## 2019-12-19 MED ORDER — ALLOPURINOL 100 MG PO TABS: 100.0000 mg | ORAL_TABLET | Freq: Every day | ORAL | 2 refills | Status: DC

## 2019-12-22 ENCOUNTER — Other Ambulatory Visit: Payer: Self-pay

## 2019-12-22 ENCOUNTER — Ambulatory Visit
Admission: RE | Admit: 2019-12-22 | Discharge: 2019-12-22 | Disposition: A | Discharge status: Home / Self Care | Payer: Medicare Other | Source: Ambulatory Visit | Attending: Nurse Practitioner | Admitting: Nurse Practitioner

## 2019-12-22 DIAGNOSIS — M7989 Other specified soft tissue disorders: Secondary | ICD-10-CM | POA: Diagnosis not present

## 2019-12-22 DIAGNOSIS — S52591A Other fractures of lower end of right radius, initial encounter for closed fracture: Secondary | ICD-10-CM | POA: Diagnosis not present

## 2019-12-22 DIAGNOSIS — M25531 Pain in right wrist: Secondary | ICD-10-CM

## 2019-12-23 ENCOUNTER — Other Ambulatory Visit: Payer: Self-pay | Admitting: Nurse Practitioner

## 2019-12-23 ENCOUNTER — Telehealth: Payer: Self-pay

## 2019-12-23 DIAGNOSIS — M25531 Pain in right wrist: Secondary | ICD-10-CM

## 2019-12-23 NOTE — Telephone Encounter (Signed)
Results given to patient. She will wait further details on referral.

## 2019-12-23 NOTE — Telephone Encounter (Signed)
Monday, December 22, 2019 5:02:09 PM Mast, Man [email or web address]> Subject: [name] [name] 1916/09/09    STAT from [facility] 1 acute midly midly impaced fractedthrough the distal radius Soft tissue swelling about the wrist   Per [facility] : She will place an referral to Ortho in [place].  Called patient. No answer. LMOM to return call.

## 2019-12-29 ENCOUNTER — Encounter: Payer: Self-pay | Admitting: Physician Assistant

## 2019-12-29 ENCOUNTER — Ambulatory Visit (INDEPENDENT_AMBULATORY_CARE_PROVIDER_SITE_OTHER): Payer: Medicare Other | Admitting: Physician Assistant

## 2019-12-29 ENCOUNTER — Other Ambulatory Visit: Payer: Self-pay

## 2019-12-29 DIAGNOSIS — M25531 Pain in right wrist: Secondary | ICD-10-CM | POA: Diagnosis not present

## 2019-12-29 NOTE — Progress Notes (Signed)
HPI: Ms. Domzalski comes in today for right wrist pain.  She fell September 17 on her wrist and when she saw her primary care physician she asked about the wrist and the radiographs were obtained.  She has been wearing a removable wrist splint.  States she really does not have any pain in the wrist at this point time.  Is been 6 weeks that she did fall onto the wrist. Radiographs dated 12/22/2019 show a distal radius fracture with slight impaction.  There is sclerotic changes consistent with healing.  No other acute findings.  Review of systems: See HPI otherwise negative or noncontributory.  Physical exam: General: Well-developed well-nourished female in no acute distress.  Ambulates with a walker. Right wrist radial pulse is intact.  There is no skin breakdown.  She has full dorsal and volar flexion she is able to deviate the wrist to the ulnar side and the radial side without pain.  She is nontender over the distal wrist with palpation.  There is no gross deformity at the wrist.  Impression: Right wrist fracture 6 weeks status post injury  Plan: Given the event the patient is now 6 weeks in a week ago there was some interval healing and her clinical exam would not recommend any additional radiographs.  Recommend she wear the removable wrist splint for the next 4 weeks and then discontinue.  Should follow-up with [place] as needed.  Questions were encouraged and answered both patient and her son who was present today.

## 2019-12-30 ENCOUNTER — Encounter: Payer: Self-pay | Admitting: Nurse Practitioner

## 2020-01-06 DIAGNOSIS — Z23 Encounter for immunization: Secondary | ICD-10-CM | POA: Diagnosis not present

## 2020-01-09 ENCOUNTER — Ambulatory Visit: Payer: Medicare Other | Admitting: Internal Medicine

## 2020-01-25 ENCOUNTER — Other Ambulatory Visit: Payer: Self-pay | Admitting: Nurse Practitioner

## 2020-01-25 ENCOUNTER — Other Ambulatory Visit: Payer: Self-pay | Admitting: Internal Medicine

## 2020-01-30 ENCOUNTER — Other Ambulatory Visit: Payer: Self-pay

## 2020-01-30 ENCOUNTER — Encounter: Payer: Self-pay | Admitting: Internal Medicine

## 2020-01-30 ENCOUNTER — Non-Acute Institutional Stay: Payer: Medicare Other | Admitting: Internal Medicine

## 2020-01-30 VITALS — BP 158/88 | HR 80 | Temp 97.5°F | Ht 63.0 in | Wt 139.6 lb

## 2020-01-30 DIAGNOSIS — M25531 Pain in right wrist: Secondary | ICD-10-CM

## 2020-01-30 DIAGNOSIS — I10 Essential (primary) hypertension: Secondary | ICD-10-CM | POA: Diagnosis not present

## 2020-01-30 DIAGNOSIS — M109 Gout, unspecified: Secondary | ICD-10-CM | POA: Diagnosis not present

## 2020-01-30 DIAGNOSIS — E7800 Pure hypercholesterolemia, unspecified: Secondary | ICD-10-CM | POA: Diagnosis not present

## 2020-01-30 DIAGNOSIS — I480 Paroxysmal atrial fibrillation: Secondary | ICD-10-CM | POA: Diagnosis not present

## 2020-01-30 DIAGNOSIS — M150 Primary generalized (osteo)arthritis: Secondary | ICD-10-CM

## 2020-01-30 DIAGNOSIS — N1832 Chronic kidney disease, stage 3b: Secondary | ICD-10-CM | POA: Diagnosis not present

## 2020-01-30 DIAGNOSIS — M8949 Other hypertrophic osteoarthropathy, multiple sites: Secondary | ICD-10-CM

## 2020-01-30 DIAGNOSIS — E039 Hypothyroidism, unspecified: Secondary | ICD-10-CM

## 2020-01-30 DIAGNOSIS — M159 Polyosteoarthritis, unspecified: Secondary | ICD-10-CM

## 2020-01-30 NOTE — Progress Notes (Signed)
Location:  [place] of Service:  Clinic (12)  Provider:   Code Status:  Goals of Care:  Advanced Directives 11/07/2019  Does Patient Have a Medical Advance Directive? Yes  Type of Advance Directive Living will;Healthcare Power of Attorney  Does patient want to make changes to medical advance directive? No - Guardian declined  Copy of [place] in Chart? No - copy requested  Pre-existing out of facility DNR order (yellow form or pink MOST form) -     Chief Complaint  Patient presents with  . Medical Management of Chronic Issues    Patient returns to the clinic for her 3 month follow up.     HPI: Patient is a 86 y.o. female seen today for medical management of chronic diseases.     Past Medical History:  Diagnosis Date  . Breast cancer ([place])   . Cardiac pacemaker in situ 04/01/2004   Original implant 04/01/04 Dr. Doreatha Lew Indications syncope with Mobitz 2 heart block  RV lead Guidant 4470 52 cm bipolar lead, SN [id number] atrial lead Guidant bipolar serial #[id number].  Medtronic EnPulse Model number [id number], SN PNB [id number]   . Carotid bruit 10/25/2010   right  . Diverticulosis   . H/O syncope   . 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 Hiatal hernia   . HTN (hypertension), benign   . Hyperlipidemia   . Melanoma of back ([place]) 2009  . Mobitz II   . Osteoporosis     Past Surgical History:  Procedure Laterality Date  . BREAST BIOPSY  1960   Left  . BREAST LUMPECTOMY  1960   left 2017  . BREAST LUMPECTOMY WITH RADIOACTIVE SEED LOCALIZATION Left 09/10/2015   Procedure: BREAST LUMPECTOMY WITH RADIOACTIVE SEED LOCALIZATION;  Surgeon: Excell Seltzer, MD;  Location: [facility];  Service: General;  Laterality: Left;  . CATARACT EXTRACTION Bilateral 2002   Dr. Katy Fitch  . CATARACT EXTRACTION, BILATERAL  2001  . INSERT / REPLACE / REMOVE PACEMAKER    . LOM HEMOTYMPANUM THROAT & SINUS BX  1977  . PACEMAKER GENERATOR CHANGE N/A 08/16/2012    Procedure: PACEMAKER GENERATOR CHANGE;  Surgeon: Deboraha Sprang, MD;  Location: [facility] CATH LAB;  Service: Cardiovascular;  Laterality: N/A;  . PACEMAKER INSERTION  2006   Medtronic DDDR E2DRO1, SERIAL #  [id number]  . surgery of ear      Allergies  Allergen Reactions  . Adhesive [Tape] Itching and Rash    Please use "paper" tape  . Codeine Nausea Only  . Triamterene-Hctz Other (See Comments)    fatigue  . Vasotec Diarrhea    Outpatient Encounter Medications as of 01/30/2020  Medication Sig  . acetaminophen (TYLENOL) 500 MG tablet Take 500 mg by mouth every 6 (six) hours as needed.  Marland Kitchen allopurinol (ZYLOPRIM) 100 MG tablet Take 1 tablet (100 mg total) by mouth daily. Please deliver to patient  . aspirin 81 MG tablet Take 81 mg by mouth daily.   . beta carotene w/minerals (OCUVITE) tablet Take 1 tablet by mouth daily.  . Cholecalciferol (VITAMIN D-3) 1000 units CAPS Take 1,000 Units by mouth daily.  . hydrochlorothiazide (MICROZIDE) 12.5 MG capsule TAKE (1) CAPSULE DAILY.  Marland Kitchen levothyroxine (SYNTHROID) 50 MCG tablet TAKE 1 TABLET ONCE DAILY.  Marland Kitchen lisinopril (ZESTRIL) 10 MG tablet TAKE 1 TABLET ONCE DAILY.  Marland Kitchen loperamide (IMODIUM A-D) 2 MG tablet Take 2 mg by mouth daily as needed for diarrhea or loose stools.  Marland Kitchen  metoprolol succinate (TOPROL-XL) 25 MG 24 hr tablet TAKE 1 TABLET ONCE DAILY.  . pravastatin (PRAVACHOL) 20 MG tablet TAKE 1 TABLET EACH DAY.   No facility-administered encounter medications on file as of 01/30/2020.    Review of Systems:  Review of Systems  Review of Systems  Constitutional: Negative for activity change, appetite change, chills, diaphoresis, fatigue and fever.  HENT: Negative for mouth sores, postnasal drip, rhinorrhea, sinus pain and sore throat.   Respiratory: Negative for apnea, cough, chest tightness, shortness of breath and wheezing.   Cardiovascular: Negative for chest pain, palpitations and leg swelling.  Gastrointestinal: Negative for abdominal distention,  abdominal pain, constipation, diarrhea, nausea and vomiting.  Genitourinary: Negative for dysuria and frequency.  Musculoskeletal: Negative for arthralgias, joint swelling and myalgias.  Skin: Negative for rash.  Neurological: Negative for dizziness, syncope, weakness, light-headedness and numbness.  Psychiatric/Behavioral: Negative for behavioral problems, confusion and sleep disturbance.     Health Maintenance  Topic Date Due  . TETANUS/TDAP  05/01/2021  . INFLUENZA VACCINE  Completed  . COVID-19 Vaccine  Completed  . PNA vac Low Risk Adult  Completed  . DEXA SCAN  Addressed    Physical Exam: Vitals:   01/30/20 0921  BP: (!) 158/88  Pulse: 80  Temp: (!) 97.5 F (36.4 C)  SpO2: 96%  Weight: 139 lb 9.6 oz (63.3 kg)  Height: 5\' 3"  (1.6 m)   Body mass index is 24.73 kg/m. Physical Exam  Constitutional: Oriented to person, place, and time. Well-developed and well-nourished.  HENT:  Head: Normocephalic.  Mouth/Throat: Oropharynx is clear and moist.  Eyes: Pupils are equal, round, and reactive to light.  Neck: Neck supple.  Cardiovascular: Normal rate and normal heart sounds.  Soft mumur Pulmonary/Chest: Effort normal and breath sounds normal. No respiratory distress. No wheezes. She has no rales.  Abdominal: Soft. Bowel sounds are normal. No distension. There is no tenderness. There is no rebound.  Musculoskeletal: No edema.Right wrist no pain or Swelling mildily stiff  Lymphadenopathy: none Neurological: Alert and oriented to person, place, and time. No Focal deficits Skin: Skin is warm and dry.  Psychiatric: Normal mood and affect. Behavior is normal. Thought content normal.    Labs reviewed: Basic Metabolic Panel: Recent Labs    07/22/19 0715 12/16/19 0700  NA 139 140  K 3.7 4.2  CL 103 101  CO2 26 29  GLUCOSE 103* 97  BUN 34* 35*  CREATININE 1.30* 1.58*  CALCIUM 8.9 10.0  TSH 0.84  --    Liver Function Tests: Recent Labs    07/22/19 0715  12/16/19 0700  AST 13 16  ALT 10 11  BILITOT 0.9 0.7  PROT 5.7* 6.5   No results for input(s): LIPASE, AMYLASE in the last 8760 hours. No results for input(s): AMMONIA in the last 8760 hours. CBC: Recent Labs    07/22/19 0715 12/16/19 0700  WBC 11.0* 10.1  NEUTROABS 6,424 5,030  HGB 13.7 13.8  HCT 42.5 41.3  MCV 91.4 89.0  PLT 226 245   Lipid Panel: Recent Labs    07/22/19 0715  CHOL 143  HDL 62  LDLCALC 64  TRIG 91  CHOLHDL 2.3   No results found for: HGBA1C  Procedures since last visit: No results found.  Assessment/Plan 1. Right wrist pain S/P Right Distal Radius Fracture Seen by Ortho Adviced to wear Splint for 6 weeks It is now doing well Suggested to to do some exercise with Stress balls for strengthening  2. Gout, unspecified  cause, unspecified chronicity, unspecified site Uric acid was 7.5 Not sure she is gout as she had Fracture No other joint pain Took Allopurinol for 30 days But not taking it now Will discontinue it for now Repeat Uric acid in 3 months She has no symptoms  3. Primary osteoarthritis involving multiple joints Tylenol prn  4. Essential hypertension Loose control with her fall Continue low dose of HCTZ Lisinopril and Toprol  5. Hypothyroidism, unspecified type Repeat TSH  6. Stage 4 chronic kidney disease (HCC) Repeat BMP Creat stable  7. PAF (paroxysmal atrial fibrillation) (HCC) On Toprol XL  A. fib burden is low and she is not on anticoagulation due to fall risk and low burden  History of second degree block S/P Pacemaker placement  8. Hyperlipidemia Discontinue statin due to no need at her age with no h/o Stroke or heart disease    Labs/tests ordered:  * No order type specified * Next appt:  04/29/2020

## 2020-02-10 ENCOUNTER — Ambulatory Visit (INDEPENDENT_AMBULATORY_CARE_PROVIDER_SITE_OTHER): Payer: Medicare Other | Admitting: Ophthalmology

## 2020-02-10 ENCOUNTER — Encounter (INDEPENDENT_AMBULATORY_CARE_PROVIDER_SITE_OTHER): Payer: Self-pay | Admitting: Ophthalmology

## 2020-02-10 ENCOUNTER — Other Ambulatory Visit: Payer: Self-pay

## 2020-02-10 DIAGNOSIS — H353222 Exudative age-related macular degeneration, left eye, with inactive choroidal neovascularization: Secondary | ICD-10-CM | POA: Diagnosis not present

## 2020-02-10 DIAGNOSIS — H353211 Exudative age-related macular degeneration, right eye, with active choroidal neovascularization: Secondary | ICD-10-CM

## 2020-02-10 MED ORDER — AFLIBERCEPT 2MG/0.05ML IZ SOLN FOR KALEIDOSCOPE
2.0000 mg | INTRAVITREAL | Status: AC | PRN
  Administered 2020-02-10: 11:00:00 2 mg via INTRAVITREAL

## 2020-02-10 NOTE — Patient Instructions (Signed)
Patient instructed to contact the office promptly new onset visual acuity decline or distortions 

## 2020-02-10 NOTE — Assessment & Plan Note (Signed)
Vastly improved overall anatomy and preservation of acuity OD on intravitreal Eylea, currently 10-week follow-up interval, repeat injection today to maintain current functioning and extend interval examination to 3 months next

## 2020-02-10 NOTE — Progress Notes (Signed)
02/10/2020     CHIEF COMPLAINT Patient presents for Retina Follow Up (10 WK FU OD, POSS EYLEA OD///Pt reports stable vision, no new f/f, no pain or pressure. )   HISTORY OF PRESENT ILLNESS: [name] [name] is a 86 y.o. female who presents to the clinic today for:   HPI    Retina Follow Up    Patient presents with  Wet AMD.  In right eye.  This started 10 weeks ago.  Severity is mild.  Duration of 10 weeks.  Since onset it is stable. Additional comments: 10 WK FU OD, POSS EYLEA OD   Pt reports stable vision, no new f/f, no pain or pressure.        Last edited by Nichola Sizer D on 02/10/2020 10:34 AM. (History)      Referring physician: Mast, Man X, NP [address],  [place] [address]  HISTORICAL INFORMATION:   Selected notes from the MEDICAL RECORD NUMBER       CURRENT MEDICATIONS: No current outpatient medications on file. (Ophthalmic Drugs)   No current facility-administered medications for this visit. (Ophthalmic Drugs)   Current Outpatient Medications (Other)  Medication Sig  . acetaminophen (TYLENOL) 500 MG tablet Take 500 mg by mouth every 6 (six) hours as needed.  . beta carotene w/minerals (OCUVITE) tablet Take 1 tablet by mouth daily.  . Cholecalciferol (VITAMIN D-3) 1000 units CAPS Take 1,000 Units by mouth daily.  . hydrochlorothiazide (MICROZIDE) 12.5 MG capsule TAKE (1) CAPSULE DAILY.  Marland Kitchen levothyroxine (SYNTHROID) 50 MCG tablet TAKE 1 TABLET ONCE DAILY.  Marland Kitchen lisinopril (ZESTRIL) 10 MG tablet TAKE 1 TABLET ONCE DAILY.  Marland Kitchen loperamide (IMODIUM A-D) 2 MG tablet Take 2 mg by mouth daily as needed for diarrhea or loose stools.  . metoprolol succinate (TOPROL-XL) 25 MG 24 hr tablet TAKE 1 TABLET ONCE DAILY.   No current facility-administered medications for this visit. (Other)      REVIEW OF SYSTEMS:    ALLERGIES Allergies  Allergen Reactions  . Adhesive [Tape] Itching and Rash    Please use "paper" tape  . Codeine Nausea Only  .  Triamterene-Hctz Other (See Comments)    fatigue  . Vasotec Diarrhea    PAST MEDICAL HISTORY Past Medical History:  Diagnosis Date  . Breast cancer ([place])   . Cardiac pacemaker in situ 04/01/2004   Original implant 04/01/04 Dr. Doreatha Lew Indications syncope with Mobitz 2 heart block  RV lead Guidant 4470 52 cm bipolar lead, SN [id number] atrial lead Guidant bipolar serial #[id number].  Medtronic EnPulse Model number [id number], SN PNB [id number]   . Carotid bruit 10/25/2010   right  . Diverticulosis   . H/O syncope   . 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 Hiatal hernia   . HTN (hypertension), benign   . Hyperlipidemia   . Melanoma of back ([place]) 2009  . Mobitz II   . Osteoporosis    Past Surgical History:  Procedure Laterality Date  . BREAST BIOPSY  1960   Left  . BREAST LUMPECTOMY  1960   left 2017  . BREAST LUMPECTOMY WITH RADIOACTIVE SEED LOCALIZATION Left 09/10/2015   Procedure: BREAST LUMPECTOMY WITH RADIOACTIVE SEED LOCALIZATION;  Surgeon: Excell Seltzer, MD;  Location: [facility];  Service: General;  Laterality: Left;  . CATARACT EXTRACTION Bilateral 2002   Dr. Katy Fitch  . CATARACT EXTRACTION, BILATERAL  2001  . INSERT / REPLACE / REMOVE PACEMAKER    . LOM HEMOTYMPANUM THROAT & SINUS BX  [place] N/A 08/16/2012   Procedure: PACEMAKER GENERATOR CHANGE;  Surgeon: Deboraha Sprang, MD;  Location: [facility] CATH LAB;  Service: Cardiovascular;  Laterality: N/A;  . PACEMAKER INSERTION  2006   Medtronic DDDR E2DRO1, SERIAL #  [id number]  . surgery of ear      FAMILY HISTORY Family History  Problem Relation Age of Onset  . Stroke Mother 69  . Kidney disease Father 57  . Cirrhosis Son     SOCIAL HISTORY Social History   Tobacco Use  . Smoking status: Former Smoker    Packs/day: 0.33    Years: 10.00    Pack years: 3.30    Types: Cigarettes    Quit date: 02/27/1946    Years since quitting: 74.0  . Smokeless tobacco: Never Used  Vaping Use  . Vaping Use: Never  used  Substance Use Topics  . Alcohol use: Yes    Alcohol/week: 7.0 standard drinks    Types: 7 Glasses of wine per week    Comment: wine  . Drug use: No         OPHTHALMIC EXAM:  Base Eye Exam    Visual Acuity (ETDRS)      Right Left   Dist cc 20/50 +2 CF at 2'   Dist ph cc NI    Correction: Glasses       Tonometry (Tonopen, 10:40 AM)      Right Left   Pressure 18 17       Visual Fields (Counting fingers)      Left Right    Full Full       Extraocular Movement      Right Left    Full, Ortho Full, Ortho       Neuro/Psych    Oriented x3: Yes   Mood/Affect: Normal       Dilation    Right eye: 1.0% Mydriacyl, 2.5% Phenylephrine @ 10:40 AM        Slit Lamp and Fundus Exam    External Exam      Right Left   External Normal Normal       Slit Lamp Exam      Right Left   Lids/Lashes Normal Normal   Conjunctiva/Sclera White and quiet White and quiet   Cornea Clear Clear   Anterior Chamber Deep and quiet Deep and quiet   Iris Round and reactive Round and reactive   Lens Posterior chamber intraocular lens Posterior chamber intraocular lens   Anterior Vitreous Normal Normal       Fundus Exam      Right Left   Posterior Vitreous Posterior vitreous detachment    Disc Normal    C/D Ratio 0.45    Macula Atrophy, Retinal pigment epithelial atrophy, Age related macular degeneration, Early age related macular degeneration, Drusen, no exudates, no hemorrhage, no macular thickening, Geographic atrophy, reddish hue temporal    Vessels Normal    Periphery Normal           IMAGING AND PROCEDURES  Imaging and Procedures for 02/10/20  OCT, Retina - OU - Both Eyes       Right Eye Quality was good. Scan locations included subfoveal. Central Foveal Thickness: 272. Progression has improved. Findings include abnormal foveal contour, retinal drusen , no SRF, subretinal scarring.   Left Eye Quality was good. Scan locations included subfoveal. Central Foveal  Thickness: 273. Progression has been stable. Findings include abnormal foveal contour, central retinal atrophy, outer retinal atrophy,  no SRF, no IRF, subretinal scarring.   Notes Anatomy and visual acuity OD preserved and stable with much less CNVM activity at 10-week interval today  We will repeat injection Eylea today to maintain and extend interval of examination to 3 months       Intravitreal Injection, Pharmacologic Agent - OD - Right Eye       Time Out 02/10/2020. 11:11 AM. Confirmed correct patient, procedure, site, and patient consented.   Anesthesia Topical anesthesia was used. Anesthetic medications included Akten 3.5%.   Procedure Preparation included 10% betadine to eyelids, 5% betadine to ocular surface, Ofloxacin . A 30 gauge needle was used.   Injection:  2 mg aflibercept Alfonse Flavors) SOLN   NDC: [id number], Lot: [id number]   Route: Intravitreal, Site: Right Eye, Waste: 0 mg  Post-op Post injection exam found visual acuity of at least counting fingers. The patient tolerated the procedure well. There were no complications. Post injection medications were not given.                 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 Exudative age-related macular degeneration of right eye with active choroidal neovascularization (HCC)  H35.3211 OCT, Retina - OU - Both Eyes    Intravitreal Injection, Pharmacologic Agent - OD - Right Eye    aflibercept (EYLEA) SOLN 2 mg  2. Exudative age-related macular degeneration of  left eye with inactive choroidal neovascularization ([place])  H35.3222     1.  OD looks excellent, preserved visual functioning and anatomy stable at 10-week follow-up interval post Eylea.  Repeat today  2.  Dilate OU next likely injection OD Eylea  3.  Ophthalmic Meds Ordered this visit:  Meds ordered this encounter  Medications  . aflibercept (EYLEA) SOLN 2 mg       Return in about 3 months (around 05/10/2020) for COLOR FP, DILATE OU, EYLEA OCT, OD.  Patient Instructions  Patient instructed to contact the office promptly new onset visual acuity decline or distortions    Explained the diagnoses, plan, and follow up with the patient and they expressed understanding.  Patient expressed understanding of the importance of proper follow up care.   Clent Demark Gabrial Poppell M.D. Diseases & Surgery of the Retina and Vitreous Retina & Diabetic [place] 02/10/20     Abbreviations: M myopia (nearsighted); A astigmatism; H hyperopia (farsighted); P presbyopia; Mrx spectacle prescription;  CTL contact lenses; OD right eye; OS left eye; OU both eyes  XT exotropia; ET esotropia; PEK punctate epithelial keratitis; PEE punctate epithelial erosions; DES dry eye syndrome; MGD meibomian gland dysfunction; ATs artificial tears; PFAT's preservative free artificial tears; Cool nuclear sclerotic cataract; PSC posterior subcapsular cataract; ERM epi-retinal membrane; PVD posterior vitreous detachment; RD retinal detachment; DM diabetes mellitus; DR diabetic retinopathy; NPDR non-proliferative diabetic retinopathy; PDR proliferative diabetic retinopathy; CSME clinically significant macular edema; DME diabetic macular edema; dbh dot blot hemorrhages; CWS cotton wool spot; POAG primary open angle glaucoma; C/D cup-to-disc ratio; HVF humphrey visual field; GVF goldmann visual field; OCT optical coherence tomography; IOP intraocular pressure; BRVO Branch retinal vein occlusion; CRVO central retinal vein occlusion; CRAO  central retinal  artery occlusion; BRAO branch retinal artery occlusion; RT retinal tear; SB scleral buckle; PPV pars plana vitrectomy; VH Vitreous hemorrhage; PRP panretinal laser photocoagulation; IVK intravitreal kenalog; VMT vitreomacular traction; MH Macular hole;  NVD neovascularization of the disc; NVE neovascularization elsewhere; AREDS age related eye disease study; ARMD age related macular degeneration; POAG primary open angle glaucoma; EBMD epithelial/anterior basement membrane dystrophy; ACIOL anterior chamber intraocular lens; IOL intraocular lens; PCIOL posterior chamber intraocular lens; Phaco/IOL phacoemulsification with intraocular lens placement; [place] photorefractive keratectomy; LASIK laser assisted in situ keratomileusis; HTN hypertension; DM diabetes mellitus; COPD chronic obstructive pulmonary disease

## 2020-02-10 NOTE — Assessment & Plan Note (Signed)
No signs of recurrence 

## 2020-02-12 ENCOUNTER — Ambulatory Visit (INDEPENDENT_AMBULATORY_CARE_PROVIDER_SITE_OTHER): Payer: Medicare Other | Admitting: Physician Assistant

## 2020-02-12 ENCOUNTER — Encounter: Payer: Self-pay | Admitting: Physician Assistant

## 2020-02-12 ENCOUNTER — Other Ambulatory Visit: Payer: Self-pay

## 2020-02-12 VITALS — BP 120/74 | HR 77 | Ht 63.0 in | Wt 135.2 lb

## 2020-02-12 DIAGNOSIS — Z950 Presence of cardiac pacemaker: Secondary | ICD-10-CM | POA: Diagnosis not present

## 2020-02-12 DIAGNOSIS — I480 Paroxysmal atrial fibrillation: Secondary | ICD-10-CM

## 2020-02-12 DIAGNOSIS — I10 Essential (primary) hypertension: Secondary | ICD-10-CM

## 2020-02-12 NOTE — Patient Instructions (Signed)
Medication Instructions:  Your physician recommends that you continue on your current medications as directed. Please refer to the Current Medication list given to you today.  *If you need a refill on your cardiac medications before your next appointment, please call your pharmacy*  Lab Work: NONE ordered at this time of appointment   If you have labs (blood work) drawn today and your tests are completely normal, you will receive your results only by: MyChart Message (if you have MyChart) OR A paper copy in the mail If you have any lab test that is abnormal or we need to change your treatment, we will call you to review the results.  Testing/Procedures: NONE ordered at this time of appointment   Follow-Up: At [facility], you and your health needs are our priority.  As part of our continuing mission to provide you with exceptional heart care, we have created designated Provider Care Teams.  These Care Teams include your primary Cardiologist (physician) and Advanced Practice Providers (APPs -  Physician Assistants and Nurse Practitioners) who all work together to provide you with the care you need, when you need it.  We recommend signing up for the patient portal called "MyChart".  Sign up information is provided on this After Visit Summary.  MyChart is used to connect with patients for Virtual Visits (Telemedicine).  Patients are able to view lab/test results, encounter notes, upcoming appointments, etc.  Non-urgent messages can be sent to your provider as well.   To learn more about what you can do with MyChart, go to [email or web address].    Your next appointment:   12 month(s)  The format for your next appointment:   In Person  Provider:   K. Chad Hilty, MD  Other Instructions   

## 2020-02-12 NOTE — Progress Notes (Signed)
Cardiology Office Note:    Date:  02/14/2020   ID:  [name] [name], DOB 1916/10/23, MRN [id number]  PCP:  Mast, Man X, NP  [facility] Cardiologist:  Pixie Casino, MD  [facility] Electrophysiologist:  None   Referring MD: Mast, Man X, NP   Chief Complaint  Patient presents with  . Follow-up    Seen for Dr. Debara Pickett    History of Present Illness:    [name] [name] is a 86 y.o. female with a hx of breast cancer, atrial fibrillation, syncope with Mobitz 2 AV block s/p Medtronic PPM, hypertension, CKD and osteoporosis.  She is a former patient of Dr. Claiborne Billings.  She underwent Medtronic pacemaker placed in 2006.  She underwent generator change out in 2014.  Despite history of atrial fibrillation, patient is not anticoagulated due to fall risk at the low A. fib burden.  Her device was last interrogated in October 2021, at which time she had normal device function and low A. fib burden of 0.2%.  Patient presents today for follow-up. She denies any chest pain or shortness of breath. She has no lower extremity edema, orthopnea or PND. She lives at [facility] facility. She has no recent dizziness or feeling of passing out.   Past Medical History:  Diagnosis Date  . Breast cancer ([place])   . Cardiac pacemaker in situ 04/01/2004   Original implant 04/01/04 Dr. Doreatha Lew Indications syncope with Mobitz 2 heart block  RV lead Guidant 4470 52 cm bipolar lead, SN [id number] atrial lead Guidant bipolar serial #[id number].  Medtronic EnPulse Model number [id number], SN PNB [id number]   . Carotid bruit 10/25/2010   right  . Diverticulosis   . H/O syncope   . 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 Hiatal hernia   . HTN (hypertension), benign   . Hyperlipidemia   . Melanoma of back ([place]) 2009  . Mobitz II   . Osteoporosis     Past Surgical History:  Procedure Laterality Date  . BREAST BIOPSY  1960   Left  . BREAST LUMPECTOMY  1960   left 2017  . BREAST LUMPECTOMY WITH RADIOACTIVE  SEED LOCALIZATION Left 09/10/2015   Procedure: BREAST LUMPECTOMY WITH RADIOACTIVE SEED LOCALIZATION;  Surgeon: Excell Seltzer, MD;  Location: [place];  Service: General;  Laterality: Left;  . CATARACT EXTRACTION Bilateral 2002   Dr. Katy Fitch  . CATARACT EXTRACTION, BILATERAL  2001  . INSERT / REPLACE / REMOVE PACEMAKER    . LOM HEMOTYMPANUM THROAT & SINUS BX  1977  . PACEMAKER GENERATOR CHANGE N/A 08/16/2012   Procedure: PACEMAKER GENERATOR CHANGE;  Surgeon: Deboraha Sprang, MD;  Location: [facility] CATH LAB;  Service: Cardiovascular;  Laterality: N/A;  . PACEMAKER INSERTION  2006   Medtronic DDDR E2DRO1, SERIAL #  [id number]  . surgery of ear      Current Medications: Current Meds  Medication Sig  . acetaminophen (TYLENOL) 500 MG tablet Take 500 mg by mouth every 6 (six) hours as needed.  . beta carotene w/minerals (OCUVITE) tablet Take 1 tablet by mouth daily.  . Cholecalciferol (VITAMIN D-3) 1000 units CAPS Take 1,000 Units by mouth daily.  . hydrochlorothiazide (MICROZIDE) 12.5 MG capsule TAKE (1) CAPSULE DAILY.  Marland Kitchen levothyroxine (SYNTHROID) 50 MCG tablet TAKE 1 TABLET ONCE DAILY.  Marland Kitchen lisinopril (ZESTRIL) 10 MG tablet TAKE 1 TABLET ONCE DAILY.  Marland Kitchen loperamide (IMODIUM A-D) 2 MG tablet Take 2 mg by mouth daily as needed for diarrhea or loose stools.  Marland Kitchen  metoprolol succinate (TOPROL-XL) 25 MG 24 hr tablet TAKE 1 TABLET ONCE DAILY.     Allergies:   Adhesive [tape], Codeine, Triamterene-hctz, and Vasotec   Social History   Socioeconomic History  . Marital status: Widowed    Spouse name: Not on file  . Number of children: 3  . Years of education: Not on file  . Highest education level: Not on file  Occupational History  . Not on file  Tobacco Use  . Smoking status: Former Smoker    Packs/day: 0.33    Years: 10.00    Pack years: 3.30    Types: Cigarettes    Quit date: 02/27/1946    Years since quitting: 74.0  . Smokeless tobacco: Never Used  Vaping Use  . Vaping Use: Never used   Substance and Sexual Activity  . Alcohol use: Yes    Alcohol/week: 7.0 standard drinks    Types: 7 Glasses of wine per week    Comment: wine  . Drug use: No  . Sexual activity: Not on file  Other Topics Concern  . Not on file  Social History Narrative   Social History      Diet?       Do you drink/eat things with caffeine? yes      Marital status?       Widowed                              What year were you married? 1947      Do you live in a house, apartment, assisted living, condo, trailer, etc.? Yes apartment      Is it one or more stories?       How many persons live in your home? 1       Do you have any pets in your home? (please list) no       Highest level of education completed? 4 years college       Current or past profession: Network engineer       Do you exercise?                   No                    Type & how often?      Advanced Directives      Do you have a living will? yes      Do you have a DNR form?                                  If not, do you want to discuss one?      Do you have signed POA/HPOA for forms? yes      Functional Status      Do you have difficulty bathing or dressing yourself? No       Do you have difficulty preparing food or eating? No       Do you have difficulty managing your medications? No       Do you have difficulty managing your finances? No       Do you have difficulty affording your medications? No       Social Determinants of Health   Financial Resource Strain: Not on file  Food Insecurity: Not on file  Transportation Needs: Not on file  Physical Activity: Not on file  Stress: Not on file  Social Connections: Not on file     Family History: The patient's family history includes Cirrhosis in her son; Kidney disease (age of onset: 4) in her father; Stroke (age of onset: 70) in her mother.  ROS:   Please see the history of present illness.     All other systems reviewed and are negative.  EKGs/Labs/Other  Studies Reviewed:    The following studies were reviewed today:  N/A  EKG:  EKG is ordered today.  The ekg ordered today demonstrates atrial sensed ventricularly paced rhythm  Recent Labs: 07/22/2019: TSH 0.84 12/16/2019: ALT 11; BUN 35; Creat 1.58; Hemoglobin 13.8; Platelets 245; Potassium 4.2; Sodium 140  Recent Lipid Panel    Component Value Date/Time   CHOL 143 07/22/2019 0715   TRIG 91 07/22/2019 0715   HDL 62 07/22/2019 0715   CHOLHDL 2.3 07/22/2019 0715   LDLCALC 64 07/22/2019 0715     Risk Assessment/Calculations:     CHA2DS2-VASc Score = 4  This indicates a 4.8% annual risk of stroke. The patient's score is based upon: CHF History: No HTN History: Yes Diabetes History: No Stroke History: No Vascular Disease History: No Age Score: 2 Gender Score: 1      Physical Exam:    VS:  BP 120/74 (BP Location: Left Arm, Patient Position: Sitting)   Pulse 77   Ht 5\' 3"  (1.6 m)   Wt 135 lb 3.2 oz (61.3 kg)   SpO2 97%   BMI 23.95 kg/m     Wt Readings from Last 3 Encounters:  02/12/20 135 lb 3.2 oz (61.3 kg)  01/30/20 139 lb 9.6 oz (63.3 kg)  12/11/19 139 lb (63 kg)     GEN:  Well nourished, well developed in no acute distress HEENT: Normal NECK: No JVD; No carotid bruits LYMPHATICS: No lymphadenopathy CARDIAC: RRR, no murmurs, rubs, gallops RESPIRATORY:  Clear to auscultation without rales, wheezing or rhonchi  ABDOMEN: Soft, non-tender, non-distended MUSCULOSKELETAL:  No edema; No deformity  SKIN: Warm and dry NEUROLOGIC:  Alert and oriented x 3 PSYCHIATRIC:  Normal affect   ASSESSMENT:    1. PAF (paroxysmal atrial fibrillation) ([place])   2. Pacemaker   3. Essential hypertension    PLAN:    In order of problems listed above:  1. PAF: Rare occurrence.  Recent device interrogation only revealed a 0.2% A. fib burden.  Continue metoprolol succinate.  Avoid anticoagulation given her advanced age  61. History of Mobitz 2 heart block s/p Medtronic PPM:  Continue interrogation every 45-month  3. Hypertension: Blood pressure well controlled.        Medication Adjustments/Labs and Tests Ordered: Current medicines are reviewed at length with the patient today.  Concerns regarding medicines are outlined above.  Orders Placed This Encounter  Procedures  . EKG 12-Lead   No orders of the defined types were placed in this encounter.   Patient Instructions  Medication Instructions:  Your physician recommends that you continue on your current medications as directed. Please refer to the Current Medication list given to you today.  *If you need a refill on your cardiac medications before your next appointment, please call your pharmacy*  Lab Work: NONE ordered at this time of appointment   If you have labs (blood work) drawn today and your tests are completely normal, you will receive your results only by: Marland Kitchen MyChart Message (if you have MyChart) OR . A paper copy in the mail If you have any lab test  that is abnormal or we need to change your treatment, we will call you to review the results.  Testing/Procedures: NONE ordered at this time of appointment   Follow-Up: At [facility], you and your health needs are our priority.  As part of our continuing mission to provide you with exceptional heart care, we have created designated Provider Care Teams.  These Care Teams include your primary Cardiologist (physician) and Advanced Practice Providers (APPs -  Physician Assistants and Nurse Practitioners) who all work together to provide you with the care you need, when you need it.  We recommend signing up for the patient portal called "MyChart".  Sign up information is provided on this After Visit Summary.  MyChart is used to connect with patients for Virtual Visits (Telemedicine).  Patients are able to view lab/test results, encounter notes, upcoming appointments, etc.  Non-urgent messages can be sent to your provider as well.   To learn more  about what you can do with MyChart, go to [email or web address].    Your next appointment:   12 month(s)  The format for your next appointment:   In Person  Provider:   Raliegh Ip Mali Hilty, MD  Other Instructions      Signed, Almyra Deforest, [place]  02/14/2020 6:30 PM    [place]

## 2020-02-14 ENCOUNTER — Encounter: Payer: Self-pay | Admitting: Physician Assistant

## 2020-02-17 ENCOUNTER — Other Ambulatory Visit: Payer: Self-pay | Admitting: Internal Medicine

## 2020-02-17 NOTE — Telephone Encounter (Signed)
Patient wants refill on medication "Hydrochlorothiazide 12.5 mg". Patient medication was last ordered 11/21/2019 with 90 capsules to be taken once daily. Patient has no additional refills. Patient is due. Medication has allergy contraindication. Medications pend and sent to PCP Mast, Man X, NP . Please Advise.

## 2020-03-08 ENCOUNTER — Other Ambulatory Visit: Payer: Self-pay | Admitting: Nurse Practitioner

## 2020-03-08 NOTE — Telephone Encounter (Signed)
Pharmacy requested refills.  Pended Rx's and sent to [facility] for approval due to [place].

## 2020-03-10 ENCOUNTER — Ambulatory Visit (INDEPENDENT_AMBULATORY_CARE_PROVIDER_SITE_OTHER): Payer: Medicare Other

## 2020-03-10 DIAGNOSIS — I442 Atrioventricular block, complete: Secondary | ICD-10-CM | POA: Diagnosis not present

## 2020-03-10 LAB — CUP PACEART REMOTE DEVICE CHECK
Battery Impedance: 2094 Ohm
Battery Remaining Longevity: 27 mo
Battery Voltage: 2.75 V
Brady Statistic AP VP Percent: 18 %
Brady Statistic AP VS Percent: 0 %
Brady Statistic AS VP Percent: 81 %
Brady Statistic AS VS Percent: 0 %
Date Time Interrogation Session: 20220111110650
Implantable Lead Implant Date: 20060203
Implantable Lead Implant Date: 20060203
Implantable Lead Location: 753859
Implantable Lead Location: 753860
Implantable Lead Model: 4469
Implantable Lead Model: 4470
Implantable Lead Serial Number: 458590
Implantable Lead Serial Number: 500613
Implantable Pulse Generator Implant Date: 20140620
Lead Channel Impedance Value: 429 Ohm
Lead Channel Impedance Value: 485 Ohm
Lead Channel Pacing Threshold Amplitude: 1 V
Lead Channel Pacing Threshold Amplitude: 1.125 V
Lead Channel Pacing Threshold Pulse Width: 0.4 ms
Lead Channel Pacing Threshold Pulse Width: 0.4 ms
Lead Channel Setting Pacing Amplitude: 2.25 V
Lead Channel Setting Pacing Amplitude: 2.5 V
Lead Channel Setting Pacing Pulse Width: 0.4 ms
Lead Channel Setting Sensing Sensitivity: 2.8 mV

## 2020-03-23 NOTE — Progress Notes (Signed)
Remote pacemaker transmission.   

## 2020-04-26 ENCOUNTER — Other Ambulatory Visit: Payer: Self-pay

## 2020-04-26 ENCOUNTER — Encounter (INDEPENDENT_AMBULATORY_CARE_PROVIDER_SITE_OTHER): Payer: Self-pay | Admitting: Ophthalmology

## 2020-04-26 ENCOUNTER — Ambulatory Visit (INDEPENDENT_AMBULATORY_CARE_PROVIDER_SITE_OTHER): Payer: Medicare Other | Admitting: Ophthalmology

## 2020-04-26 DIAGNOSIS — H43813 Vitreous degeneration, bilateral: Secondary | ICD-10-CM

## 2020-04-26 DIAGNOSIS — H353211 Exudative age-related macular degeneration, right eye, with active choroidal neovascularization: Secondary | ICD-10-CM

## 2020-04-26 NOTE — Assessment & Plan Note (Signed)
The nature of posterior vitreous detachment was discussed with the patient as well as its physiology, its age prevalence, and its possible implication regarding retinal breaks and detachment.  An informational brochure was given to the patient.  All the patient's questions were answered.  The patient was asked to return if new or different flashes or floaters develops.   Patient was instructed to contact office immediately if any changes were noticed. I explained to the patient that vitreous inside the eye is similar to jello inside a bowl. As the jello melts it can start to pull away from the bowl, similarly the vitreous throughout our lives can begin to pull away from the retina. That process is called a posterior vitreous detachment. In some cases, the vitreous can tug hard enough on the retina to form a retinal tear. I discussed with the patient the signs and symptoms of a retinal detachment.  Do not rub the eye.  Likely tractional changes causing the flashing flickering light yet there are no retinal holes or tears seen on dilated funduscopic examination OU

## 2020-04-26 NOTE — Assessment & Plan Note (Signed)
Monocular patient doing well, currently at 3-week month follow-up tubals for wet AMD, will maintain schedule appointment

## 2020-04-26 NOTE — Progress Notes (Signed)
04/26/2020     CHIEF COMPLAINT Patient presents for Eye Problem (Pt presents with flashes OD, onset 04/16/20, that have since gotten bigger. Pt reports that flashes come and go. Pt denies any pain, pressure or floaters OU. )   HISTORY OF PRESENT ILLNESS: [name] [name] is a 87 y.o. female who presents to the clinic today for:   HPI    Eye Problem     Additional comments: Pt presents with flashes OD, onset 04/16/20, that have since gotten bigger. Pt reports that flashes come and go. Pt denies any pain, pressure or floaters OU.        Last edited by Nichola Sizer D on 04/26/2020  2:26 PM. (History)      Referring physician: Mast, Man X, NP [address],  [place] [address]  HISTORICAL INFORMATION:   Selected notes from the MEDICAL RECORD NUMBER       CURRENT MEDICATIONS: No current outpatient medications on file. (Ophthalmic Drugs)   No current facility-administered medications for this visit. (Ophthalmic Drugs)   Current Outpatient Medications (Other)  Medication Sig  . hydrochlorothiazide (MICROZIDE) 12.5 MG capsule TAKE (1) CAPSULE DAILY.  Marland Kitchen levothyroxine (SYNTHROID) 50 MCG tablet TAKE 1 TABLET ONCE DAILY.  Marland Kitchen lisinopril (ZESTRIL) 10 MG tablet TAKE 1 TABLET ONCE DAILY.  Marland Kitchen acetaminophen (TYLENOL) 500 MG tablet Take 500 mg by mouth every 6 (six) hours as needed.  . beta carotene w/minerals (OCUVITE) tablet Take 1 tablet by mouth daily.  . Cholecalciferol (VITAMIN D-3) 1000 units CAPS Take 1,000 Units by mouth daily.  Marland Kitchen loperamide (IMODIUM A-D) 2 MG tablet Take 2 mg by mouth daily as needed for diarrhea or loose stools.  . metoprolol succinate (TOPROL-XL) 25 MG 24 hr tablet TAKE 1 TABLET ONCE DAILY.   No current facility-administered medications for this visit. (Other)      REVIEW OF SYSTEMS:    ALLERGIES Allergies  Allergen Reactions  . Adhesive [Tape] Itching and Rash    Please use "paper" tape  . Codeine Nausea Only  . Triamterene-Hctz Other (See  Comments)    fatigue  . Vasotec Diarrhea    PAST MEDICAL HISTORY Past Medical History:  Diagnosis Date  . Breast cancer ([place])   . Cardiac pacemaker in situ 04/01/2004   Original implant 04/01/04 Dr. Doreatha Lew Indications syncope with Mobitz 2 heart block  RV lead Guidant 4470 52 cm bipolar lead, SN [id number] atrial lead Guidant bipolar serial #[id number].  Medtronic EnPulse Model number [id number], SN PNB [id number]   . Carotid bruit 10/25/2010   right  . Diverticulosis   . H/O syncope   . 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 Hiatal hernia   . HTN (hypertension), benign   . Hyperlipidemia   . Melanoma of back ([place]) 2009  . Mobitz II   . Osteoporosis    Past Surgical History:  Procedure Laterality Date  . BREAST BIOPSY  1960   Left  . BREAST LUMPECTOMY  1960   left 2017  . BREAST LUMPECTOMY WITH RADIOACTIVE SEED LOCALIZATION Left 09/10/2015   Procedure: BREAST LUMPECTOMY WITH RADIOACTIVE SEED LOCALIZATION;  Surgeon: Excell Seltzer, MD;  Location: [place];  Service: General;  Laterality: Left;  . CATARACT EXTRACTION Bilateral 2002   Dr. Katy Fitch  . CATARACT EXTRACTION, BILATERAL  2001  . INSERT / REPLACE / REMOVE PACEMAKER    . LOM HEMOTYMPANUM THROAT & SINUS BX  1977  . PACEMAKER GENERATOR CHANGE N/A 08/16/2012   Procedure: PACEMAKER GENERATOR CHANGE;  Surgeon: Deboraha Sprang, MD;  Location: [facility] CATH LAB;  Service: Cardiovascular;  Laterality: N/A;  . PACEMAKER INSERTION  2006   Medtronic DDDR E2DRO1, SERIAL #  [id number]  . surgery of ear      FAMILY HISTORY Family History  Problem Relation Age of Onset  . Stroke Mother 85  . Kidney disease Father 70  . Cirrhosis Son     SOCIAL HISTORY Social History   Tobacco Use  . Smoking status: Former Smoker    Packs/day: 0.33    Years: 10.00    Pack years: 3.30    Types: Cigarettes    Quit date: 02/27/1946    Years since quitting: 74.2  . Smokeless tobacco: Never Used  Vaping Use  . Vaping Use: Never used  Substance Use Topics   . Alcohol use: Yes    Alcohol/week: 7.0 standard drinks    Types: 7 Glasses of wine per week    Comment: wine  . Drug use: No         OPHTHALMIC EXAM: Base Eye Exam    Visual Acuity (ETDRS)      Right Left   Dist cc 20/50 CF at 2'   Dist ph cc NI    Correction: Glasses       Tonometry (Tonopen, 2:30 PM)      Right Left   Pressure 19 19       Pupils      Dark Light Shape React APD   Right 4 4 Round Minimal None   Left 4 4 Round Minimal None       Visual Fields (Counting fingers)      Left Right    Full Full       Extraocular Movement      Right Left    Full Full       Neuro/Psych    Oriented x3: Yes   Mood/Affect: Normal       Dilation    Both eyes: 1.0% Mydriacyl, 2.5% Phenylephrine @ 2:32 PM        Slit Lamp and Fundus Exam    External Exam      Right Left   External Normal Normal       Slit Lamp Exam      Right Left   Lids/Lashes Normal Normal   Conjunctiva/Sclera White and quiet White and quiet   Cornea Clear Clear   Anterior Chamber Deep and quiet Deep and quiet   Iris Round and reactive Round and reactive   Lens Open posterior capsule Posterior chamber intraocular lens   Anterior Vitreous Normal Normal       Fundus Exam      Right Left   Posterior Vitreous Posterior vitreous detachment Posterior vitreous detachment   Disc Normal Normal   C/D Ratio 0.5 0.6   Macula Atrophy, Retinal pigment epithelial atrophy, Age related macular degeneration, Early age related macular degeneration, Drusen, no exudates, no hemorrhage, no macular thickening, Geographic atrophy, reddish hue temporal Geographic atrophy, Disciform scar   Vessels Normal Normal   Periphery Normal, no holes or tears, 20 8D and 20 D examination to the ora serrata Normal, no holes or tears, 20 8D and 20 D examination to the ora serrata          IMAGING AND PROCEDURES  Imaging and Procedures for 04/26/20  Color Fundus Photography Optos - OU - Both Eyes       Right  Eye Disc findings include increased cup to disc ratio.  Macula : retinal pigment epithelium abnormalities, geographic atrophy. Vessels : normal observations. Periphery : normal observations.   Left Eye Progression has been stable. Disc findings include increased cup to disc ratio, pallor. Macula : geographic atrophy, retinal pigment epithelium abnormalities. Periphery : normal observations.   Notes No retinal holes or tears.,  Incidental posterior vitreous detachment evident.  Left eye with macular disciform scarring and also residual geographic atrophy subfoveal       OCT, Retina - OU - Both Eyes       Right Eye Quality was good. Scan locations included subfoveal. Central Foveal Thickness: 283. Progression has improved. Findings include abnormal foveal contour, retinal drusen , no SRF, subretinal scarring.   Left Eye Quality was good. Scan locations included subfoveal. Central Foveal Thickness: 267. Progression has been stable. Findings include abnormal foveal contour, central retinal atrophy, outer retinal atrophy, no SRF, no IRF, subretinal scarring.   Notes Anatomy and visual acuity OD preserved and stable with much less CNVM activity at 10-week interval today  We will repeat injection Eylea today to maintain and extend interval of examination to 3 months                ASSESSMENT/PLAN:  Posterior vitreous detachment of both eyes   The nature of posterior vitreous detachment was discussed with the patient as well as its physiology, its age prevalence, and its possible implication regarding retinal breaks and detachment.  An informational brochure was given to the patient.  All the patient's questions were answered.  The patient was asked to return if new or different flashes or floaters develops.   Patient was instructed to contact office immediately if any changes were noticed. I explained to the patient that vitreous inside the eye is similar to jello inside a bowl. As  the jello melts it can start to pull away from the bowl, similarly the vitreous throughout our lives can begin to pull away from the retina. That process is called a posterior vitreous detachment. In some cases, the vitreous can tug hard enough on the retina to form a retinal tear. I discussed with the patient the signs and symptoms of a retinal detachment.  Do not rub the eye.  Likely tractional changes causing the flashing flickering light yet there are no retinal holes or tears seen on dilated funduscopic examination OU  Exudative age-related macular degeneration of right eye with active choroidal neovascularization ([place]) Monocular patient doing well, currently at 3-week month follow-up tubals for wet AMD, will maintain schedule appointment      ICD-10-CM   1. Exudative age-related macular degeneration of right eye with active choroidal neovascularization (HCC)  H35.3211 Color Fundus Photography Optos - OU - Both Eyes    OCT, Retina - OU - Both Eyes  2. Posterior vitreous detachment of both eyes  H43.813     1.  Endings reviewed with the patient and family.  There are no retinal holes or tears in either eye.  2.  We will maintain scheduled follow-up examination in 2 weeks likely for injection Eylea and this essentially monocular patient to maintain visual function  3.  Ophthalmic Meds Ordered this visit:  No orders of the defined types were placed in this encounter.      Return for dilate, OD, EYLEA OCT, As scheduled.  There are no Patient Instructions on file for this visit.   Explained the diagnoses, plan, and follow up with the patient and they expressed understanding.  Patient expressed understanding of the importance  of proper follow up care.   Clent Demark Schyler Butikofer M.D. Diseases & Surgery of the Retina and Vitreous [facility]ina [facility] Di[facility] [place] 04/26/20     Abbreviations: M myopia (nearsighted); A astigmatism; H hyperopia (farsighted); P presbyopia; Mrx spectacle  prescription;  CTL contact lenses; OD right eye; OS left eye; OU both eyes  XT exotropia; ET esotropia; PEK punctate epithelial keratitis; PEE punctate epithelial erosions; DES dry eye syndrome; MGD meibomian gland dysfunction; ATs artificial tears; PFAT's preservative free artificial tears; [place] nuclear sclerotic cataract; PSC posterior subcapsular cataract; ERM epi-retinal membrane; PVD posterior vitreous detachment; RD retinal detachment; DM diabetes mellitus; DR diabetic retinopathy; NPDR non-proliferative diabetic retinopathy; PDR proliferative diabetic retinopathy; CSME clinically significant macular edema; DME diabetic macular edema; dbh dot blot hemorrhages; CWS cotton wool spot; POAG primary open angle glaucoma; C/D cup-to-disc ratio; HVF humphrey visual field; GVF goldmann visual field; OCT optical coherence tomography; IOP intraocular pressure; BRVO Branch retinal vein occlusion; CRVO central retinal vein occlusion; CRAO central retinal artery occlusion; BRAO branch retinal artery occlusion; RT retinal tear; SB scleral buckle; PPV pars plana vitrectomy; VH Vitreous hemorrhage; PRP panretinal laser photocoagulation; IVK intravitreal kenalog; VMT vitreomacular traction; MH Macular hole;  NVD neovascularization of the disc; NVE neovascularization elsewhere; AREDS age related eye disease study; ARMD age related macular degeneration; POAG primary open angle glaucoma; EBMD epithelial/anterior basement membrane dystrophy; ACIOL anterior chamber intraocular lens; IOL intraocular lens; PCIOL posterior chamber intraocular lens; Phaco/IOL phacoemulsification with intraocular lens placement; [place] photorefractive keratectomy; LASIK laser assisted in situ keratomileusis; HTN hypertension; DM diabetes mellitus; COPD chronic obstructive pulmonary disease

## 2020-04-29 ENCOUNTER — Other Ambulatory Visit: Payer: Self-pay

## 2020-04-29 DIAGNOSIS — M109 Gout, unspecified: Secondary | ICD-10-CM | POA: Diagnosis not present

## 2020-04-29 DIAGNOSIS — N1832 Chronic kidney disease, stage 3b: Secondary | ICD-10-CM | POA: Diagnosis not present

## 2020-04-29 DIAGNOSIS — E039 Hypothyroidism, unspecified: Secondary | ICD-10-CM | POA: Diagnosis not present

## 2020-04-30 LAB — COMPLETE METABOLIC PANEL WITH GFR
AG Ratio: 1.9 (calc) (ref 1.0–2.5)
ALT: 8 U/L (ref 6–29)
AST: 11 U/L (ref 10–35)
Albumin: 3.8 g/dL (ref 3.6–5.1)
Alkaline phosphatase (APISO): 67 U/L (ref 37–153)
BUN/Creatinine Ratio: 29 (calc) — ABNORMAL HIGH (ref 6–22)
BUN: 42 mg/dL — ABNORMAL HIGH (ref 7–25)
CO2: 27 mmol/L (ref 20–32)
Calcium: 8.9 mg/dL (ref 8.6–10.4)
Chloride: 105 mmol/L (ref 98–110)
Creat: 1.46 mg/dL — ABNORMAL HIGH (ref 0.60–0.88)
GFR, Est African American: 33 mL/min/{1.73_m2} — ABNORMAL LOW (ref >=60)
GFR, Est Non African American: 29 mL/min/{1.73_m2} — ABNORMAL LOW (ref >=60)
Globulin: 2 g/dL (calc) (ref 1.9–3.7)
Glucose, Bld: 89 mg/dL (ref 65–99)
Potassium: 3.9 mmol/L (ref 3.5–5.3)
Sodium: 141 mmol/L (ref 135–146)
Total Bilirubin: 0.7 mg/dL (ref 0.2–1.2)
Total Protein: 5.8 g/dL — ABNORMAL LOW (ref 6.1–8.1)

## 2020-04-30 LAB — URIC ACID: Uric Acid, Serum: 8.2 mg/dL — ABNORMAL HIGH (ref 2.5–7.0)

## 2020-04-30 LAB — TSH: TSH: 2.16 mIU/L (ref 0.40–4.50)

## 2020-05-03 ENCOUNTER — Other Ambulatory Visit: Payer: Self-pay | Admitting: Internal Medicine

## 2020-05-06 ENCOUNTER — Encounter: Payer: Self-pay | Admitting: Nurse Practitioner

## 2020-05-11 ENCOUNTER — Ambulatory Visit (INDEPENDENT_AMBULATORY_CARE_PROVIDER_SITE_OTHER): Payer: Medicare Other | Admitting: Ophthalmology

## 2020-05-11 ENCOUNTER — Encounter (INDEPENDENT_AMBULATORY_CARE_PROVIDER_SITE_OTHER): Payer: Self-pay | Admitting: Ophthalmology

## 2020-05-11 ENCOUNTER — Other Ambulatory Visit: Payer: Self-pay

## 2020-05-11 DIAGNOSIS — H353124 Nonexudative age-related macular degeneration, left eye, advanced atrophic with subfoveal involvement: Secondary | ICD-10-CM

## 2020-05-11 DIAGNOSIS — H43813 Vitreous degeneration, bilateral: Secondary | ICD-10-CM | POA: Diagnosis not present

## 2020-05-11 DIAGNOSIS — H353211 Exudative age-related macular degeneration, right eye, with active choroidal neovascularization: Secondary | ICD-10-CM | POA: Diagnosis not present

## 2020-05-11 MED ORDER — AFLIBERCEPT 2MG/0.05ML IZ SOLN FOR KALEIDOSCOPE
2.0000 mg | INTRAVITREAL | Status: AC | PRN
Start: 2020-05-11 — End: 2020-05-11
  Administered 2020-05-11: 2 mg via INTRAVITREAL

## 2020-05-11 NOTE — Assessment & Plan Note (Signed)
No change OS

## 2020-05-11 NOTE — Assessment & Plan Note (Signed)
Today at 59-month interval follow-up, small amount of subretinal fluid present subfoveal as well as intraretinal fluid temporally OD, will repeat Eylea today to preserve acuity and shorten interval follow-up again to 8 weeks

## 2020-05-11 NOTE — Progress Notes (Signed)
05/11/2020     CHIEF COMPLAINT Patient presents for Retina Follow Up (3 Month Wet AMD f\u. Possible Eylea OD. OCT and FP/Pt states no changes in vision, she does not see well. Pt c/o still seeing FOL in OD vision. Pt states she sees them more while watching TV.)   HISTORY OF PRESENT ILLNESS: [name] [name] is a 87 y.o. female who presents to the clinic today for:   HPI    Retina Follow Up    Patient presents with  Wet AMD.  In right eye.  Severity is moderate.  Duration of 3 months.  Since onset it is stable.  I, the attending physician,  performed the HPI with the patient and updated documentation appropriately. Additional comments: 3 Month Wet AMD f\u. Possible Eylea OD. OCT and FP Pt states no changes in vision, she does not see well. Pt c/o still seeing FOL in OD vision. Pt states she sees them more while watching TV.       Last edited by Tilda Franco on 05/11/2020 10:31 AM. (History)      Referring physician: Mast, Man X, NP [address],  [place] [address]  HISTORICAL INFORMATION:   Selected notes from the MEDICAL RECORD NUMBER       CURRENT MEDICATIONS: No current outpatient medications on file. (Ophthalmic Drugs)   No current facility-administered medications for this visit. (Ophthalmic Drugs)   Current Outpatient Medications (Other)  Medication Sig  . hydrochlorothiazide (MICROZIDE) 12.5 MG capsule TAKE (1) CAPSULE DAILY.  Marland Kitchen levothyroxine (SYNTHROID) 50 MCG tablet TAKE 1 TABLET ONCE DAILY.  Marland Kitchen lisinopril (ZESTRIL) 10 MG tablet TAKE 1 TABLET ONCE DAILY.  Marland Kitchen acetaminophen (TYLENOL) 500 MG tablet Take 500 mg by mouth every 6 (six) hours as needed.  . beta carotene w/minerals (OCUVITE) tablet Take 1 tablet by mouth daily.  . Cholecalciferol (VITAMIN D-3) 1000 units CAPS Take 1,000 Units by mouth daily.  Marland Kitchen loperamide (IMODIUM A-D) 2 MG tablet Take 2 mg by mouth daily as needed for diarrhea or loose stools.  . metoprolol succinate (TOPROL-XL) 25 MG 24 hr  tablet TAKE 1 TABLET ONCE DAILY.   No current facility-administered medications for this visit. (Other)      REVIEW OF SYSTEMS:    ALLERGIES Allergies  Allergen Reactions  . Adhesive [Tape] Itching and Rash    Please use "paper" tape  . Codeine Nausea Only  . Triamterene-Hctz Other (See Comments)    fatigue  . Vasotec Diarrhea    PAST MEDICAL HISTORY Past Medical History:  Diagnosis Date  . Breast cancer ([place])   . Cardiac pacemaker in situ 04/01/2004   Original implant 04/01/04 Dr. Doreatha Lew Indications syncope with Mobitz 2 heart block  RV lead Guidant 4470 52 cm bipolar lead, SN [id number] atrial lead Guidant bipolar serial #[id number].  Medtronic EnPulse Model number [id number], SN PNB [id number]   . Carotid bruit 10/25/2010   right  . Diverticulosis   . H/O syncope   . 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 Hiatal hernia   . HTN (hypertension), benign   . Hyperlipidemia   . Melanoma of back ([place]) 2009  . Mobitz II   . Osteoporosis    Past Surgical History:  Procedure Laterality Date  . BREAST BIOPSY  1960   Left  . BREAST LUMPECTOMY  1960   left 2017  . BREAST LUMPECTOMY WITH RADIOACTIVE SEED LOCALIZATION Left 09/10/2015   Procedure: BREAST LUMPECTOMY WITH RADIOACTIVE SEED LOCALIZATION;  Surgeon: Marland Kitchen  Hoxworth, MD;  Location: [facility];  Service: General;  Laterality: Left;  . CATARACT EXTRACTION Bilateral 2002   Dr. Katy Fitch  . CATARACT EXTRACTION, BILATERAL  2001  . INSERT / REPLACE / REMOVE PACEMAKER    . LOM HEMOTYMPANUM THROAT & SINUS BX  1977  . PACEMAKER GENERATOR CHANGE N/A 08/16/2012   Procedure: PACEMAKER GENERATOR CHANGE;  Surgeon: Deboraha Sprang, MD;  Location: [facility] CATH LAB;  Service: Cardiovascular;  Laterality: N/A;  . PACEMAKER INSERTION  2006   Medtronic DDDR [id number], SERIAL #  [id number]  . surgery of ear      FAMILY HISTORY Family History  Problem Relation Age of Onset  . Stroke Mother 57  . Kidney disease Father 91  . Cirrhosis Son     SOCIAL  HISTORY Social History   Tobacco Use  . Smoking status: Former Smoker    Packs/day: 0.33    Years: 10.00    Pack years: 3.30    Types: Cigarettes    Quit date: 02/27/1946    Years since quitting: 74.2  . Smokeless tobacco: Never Used  Vaping Use  . Vaping Use: Never used  Substance Use Topics  . Alcohol use: Yes    Alcohol/week: 7.0 standard drinks    Types: 7 Glasses of wine per week    Comment: wine  . Drug use: No         OPHTHALMIC EXAM: Base Eye Exam    Visual Acuity (ETDRS)      Right Left   Dist cc 20/50 CF @ 2'   Dist ph cc NI NI   Correction: Glasses       Tonometry (Tonopen, 10:22 AM)      Right Left   Pressure 14 15       Pupils      Pupils Dark Light Shape React APD   Right PERRL 3 2 Round Slow None   Left PERRL 3 2 Round Sluggish None       Visual Fields (Counting fingers)      Left Right     Full   Restrictions Partial outer superior temporal, superior nasal deficiencies        Neuro/Psych    Oriented x3: Yes   Mood/Affect: Normal       Dilation    Both eyes: 1.0% Mydriacyl, 2.5% Phenylephrine @ 10:22 AM        Slit Lamp and Fundus Exam    External Exam      Right Left   External Normal Normal       Slit Lamp Exam      Right Left   Lids/Lashes Normal Normal   Conjunctiva/Sclera White and quiet White and quiet   Cornea Clear Clear   Anterior Chamber Deep and quiet Deep and quiet   Iris Round and reactive Round and reactive   Lens Open posterior capsule Posterior chamber intraocular lens   Anterior Vitreous Normal Normal       Fundus Exam      Right Left   Posterior Vitreous Posterior vitreous detachment Posterior vitreous detachment   Disc Normal Normal   C/D Ratio 0.5 0.6   Macula Atrophy, Retinal pigment epithelial atrophy, Age related macular degeneration, Early age related macular degeneration, Drusen, no exudates, no hemorrhage, no macular thickening, Geographic atrophy, reddish hue temporal Geographic atrophy,  Disciform scar   Vessels Normal Normal   Periphery Normal, no holes or tears, 28D and 20 D examination to the ora serrata Normal, no holes  or tears, 28D and 20 D examination to the ora serrata          IMAGING AND PROCEDURES  Imaging and Procedures for 05/11/20  OCT, Retina - OU - Both Eyes       Right Eye Quality was good. Scan locations included subfoveal. Central Foveal Thickness: 343. Progression has improved. Findings include abnormal foveal contour, retinal drusen , subretinal scarring, intraretinal fluid, subretinal fluid.   Left Eye Quality was good. Scan locations included subfoveal. Central Foveal Thickness: 317. Progression has been stable. Findings include abnormal foveal contour, central retinal atrophy, outer retinal atrophy, no SRF, no IRF, subretinal scarring.   Notes Visual acuity preserved OD yet with new subretinal fluid and intraretinal fluid at 12-week interval today, post injection Eylea repeat today and follow-up in 8 weeks         Color Fundus Photography Optos - OU - Both Eyes       Right Eye Disc findings include increased cup to disc ratio. Macula : retinal pigment epithelium abnormalities, geographic atrophy. Vessels : normal observations. Periphery : normal observations.   Left Eye Progression has been stable. Disc findings include increased cup to disc ratio, pallor. Macula : geographic atrophy, retinal pigment epithelium abnormalities. Periphery : normal observations.   Notes No retinal holes or tears.,  Incidental posterior vitreous detachment evident.  Left eye with macular disciform scarring and also residual geographic atrophy subfoveal       Intravitreal Injection, Pharmacologic Agent - OD - Right Eye       Time Out 05/11/2020. 11:05 AM. Confirmed correct patient, procedure, site, and patient consented.   Anesthesia Topical anesthesia was used. Anesthetic medications included Akten 3.5%.   Procedure Preparation included 10%  betadine to eyelids, 5% betadine to ocular surface, Ofloxacin . A 30 gauge needle was used.   Injection:  2 mg aflibercept Alfonse Flavors) SOLN   NDC: [id number], Lot: [id number]   Route: Intravitreal, Site: Right Eye, Waste: 0 mg  Post-op Post injection exam found visual acuity of at least counting fingers. The patient tolerated the procedure well. There were no complications. Post injection medications were not given.                 ASSESSMENT/PLAN:  Exudative age-related macular degeneration of right eye with active choroidal neovascularization (HCC) Today at 32-month interval follow-up, small amount of subretinal fluid present subfoveal as well as intraretinal fluid temporally OD, will repeat Eylea today to preserve acuity and shorten interval follow-up again to 8 weeks  Posterior vitreous detachment of both eyes Stable OU peripheral examination without hole or tear  Advanced nonexudative age-related macular degeneration of left eye with subfoveal involvement No change OS      ICD-10-CM   1. Exudative age-related macular degeneration of right eye with active choroidal neovascularization (HCC)  H35.3211 OCT, Retina - OU - Both Eyes    Color Fundus Photography Optos - OU - Both Eyes    Intravitreal Injection, Pharmacologic Agent - OD - Right Eye    aflibercept (EYLEA) SOLN 2 mg  2. Posterior vitreous detachment of both eyes  H43.813   3. 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  At 38-month interval, have increased CNVM activity subfoveal OD.  We will repeat injection Eylea today  We will need to shorten the interval follow-up to now to 8 weeks probably indefinitely and this was discussed  2.  No peripheral retinal holes or tears.  3.  Stable condition OS  Ophthalmic Meds Ordered this visit:  Meds ordered this encounter  Medications  . aflibercept (EYLEA) SOLN 2 mg       Return in about 8 weeks  (around 07/06/2020) for dilate, OD, EYLEA OCT.  There are no Patient Instructions on file for this visit.   Explained the diagnoses, plan, and follow up with the patient and they expressed understanding.  Patient expressed understanding of the importance of proper follow up care.   Clent Demark Rankin M.D. Diseases & Surgery of the Retina and Vitreous Retina [facility] Diabetic [place] 05/11/20     Abbreviations: M myopia (nearsighted); A astigmatism; H hyperopia (farsighted); P presbyopia; Mrx spectacle prescription;  CTL contact lenses; OD right eye; OS left eye; OU both eyes  XT exotropia; ET esotropia; PEK punctate epithelial keratitis; PEE punctate epithelial erosions; DES dry eye syndrome; MGD meibomian gland dysfunction; ATs artificial tears; PFAT's preservative free artificial tears; [place] nuclear sclerotic cataract; PSC posterior subcapsular cataract; ERM epi-retinal membrane; PVD posterior vitreous detachment; RD retinal detachment; DM diabetes mellitus; DR diabetic retinopathy; NPDR non-proliferative diabetic retinopathy; PDR proliferative diabetic retinopathy; CSME clinically significant macular edema; DME diabetic macular edema; dbh dot blot hemorrhages; CWS cotton wool spot; POAG primary open angle glaucoma; C/D cup-to-disc ratio; HVF humphrey visual field; GVF goldmann visual field; OCT optical coherence tomography; IOP intraocular pressure; BRVO Branch retinal vein occlusion; CRVO central retinal vein occlusion; CRAO central retinal artery occlusion; BRAO branch retinal artery occlusion; RT retinal tear; SB scleral buckle; PPV pars plana vitrectomy; VH Vitreous hemorrhage; PRP panretinal laser photocoagulation; IVK intravitreal kenalog; VMT vitreomacular traction; MH Macular hole;  NVD neovascularization of the disc; NVE neovascularization elsewhere; AREDS age related eye disease study; ARMD age related macular degeneration; POAG primary open angle glaucoma; EBMD epithelial/anterior basement  membrane dystrophy; ACIOL anterior chamber intraocular lens; IOL intraocular lens; PCIOL posterior chamber intraocular lens; Phaco/IOL phacoemulsification with intraocular lens placement; [place] photorefractive keratectomy; LASIK laser assisted in situ keratomileusis; HTN hypertension; DM diabetes mellitus; COPD chronic obstructive pulmonary disease

## 2020-05-11 NOTE — Assessment & Plan Note (Signed)
Stable OU peripheral examination without hole or tear

## 2020-05-17 ENCOUNTER — Other Ambulatory Visit: Payer: Self-pay | Admitting: Nurse Practitioner

## 2020-05-17 NOTE — Telephone Encounter (Signed)
Pharmacy requested refill.  Pended Rx and sent to [facility] for approval due to HIGH ALERT Warning.  

## 2020-05-20 ENCOUNTER — Other Ambulatory Visit: Payer: Self-pay

## 2020-05-20 ENCOUNTER — Encounter: Payer: Self-pay | Admitting: Nurse Practitioner

## 2020-05-20 ENCOUNTER — Non-Acute Institutional Stay: Payer: Medicare Other | Admitting: Nurse Practitioner

## 2020-05-20 DIAGNOSIS — I872 Venous insufficiency (chronic) (peripheral): Secondary | ICD-10-CM

## 2020-05-20 DIAGNOSIS — M159 Polyosteoarthritis, unspecified: Secondary | ICD-10-CM | POA: Diagnosis not present

## 2020-05-20 DIAGNOSIS — E039 Hypothyroidism, unspecified: Secondary | ICD-10-CM

## 2020-05-20 DIAGNOSIS — N184 Chronic kidney disease, stage 4 (severe): Secondary | ICD-10-CM

## 2020-05-20 DIAGNOSIS — M1A9XX1 Chronic gout, unspecified, with tophus (tophi): Secondary | ICD-10-CM | POA: Diagnosis not present

## 2020-05-20 DIAGNOSIS — N6320 Unspecified lump in the left breast, unspecified quadrant: Secondary | ICD-10-CM | POA: Diagnosis not present

## 2020-05-20 DIAGNOSIS — I10 Essential (primary) hypertension: Secondary | ICD-10-CM | POA: Diagnosis not present

## 2020-05-20 MED ORDER — LISINOPRIL 20 MG PO TABS: 20.0000 mg | ORAL_TABLET | Freq: Every day | ORAL | 3 refills | Status: DC

## 2020-05-20 NOTE — Assessment & Plan Note (Signed)
multiple sites, prn Tylenol.

## 2020-05-20 NOTE — Assessment & Plan Note (Signed)
Not apparent.  

## 2020-05-20 NOTE — Assessment & Plan Note (Signed)
CKD creat 42/1.46 04/29/20

## 2020-05-20 NOTE — Progress Notes (Signed)
Location:   clinic [place]   Place of Service:  Clinic (12) Provider: Marlana Latus NP  Code Status: DNR Goals of Care: [place] 11/07/2019  Does Patient Have a Medical Advance Directive? Yes  Type of Advance Directive Living will;Healthcare Power of Attorney  Does patient want to make changes to medical advance directive? No - Guardian declined  Copy of [place] in Chart? No - copy requested  Pre-existing out of facility DNR order (yellow form or pink MOST form) -     Chief Complaint  Patient presents with  . Medical Management of Chronic Issues    Patient here today for follow up.     HPI: Patient is a 87 y.o. female seen today for medical management of chronic diseases.    OA, multiple sites, prn Tylenol.              HTN, blood pressure is controlled on HCTZ,  Lisinopril,  Metoprolol. Bun/creat 42/1.46 04/29/20 Hypothyroidism, stable, on Levothyroxine 73mg qd. TSH 2.16 04/29/20 CKD creat 42/1.46 04/29/20  Gout asymptomatic, uric acid 7.5 12/16/19, treated with 30 day Allopurinol, repeated uric acid 8.2 04/29/20, will dc HCZ, repeat uric acid in 3 months.    Past Medical History:  Diagnosis Date  . [place]   . Cardiac pacemaker in situ 04/01/2004   Original implant 04/01/04 Dr. TDoreatha LewIndications syncope with Mobitz 2 heart block  RV lead Guidant 4470 52 cm [place] [id number] lead Guidant bipolar serial [id number]  Medtronic EnPulse Model number [id number] SN PNB [id number]   . Carotid bruit 10/25/2010   right  . Diverticulosis   . H/O syncope   . 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 Hiatal hernia   . HTN (hypertension), benign   . Hyperlipidemia   . [place]  . Mobitz II   . Osteoporosis     Past Surgical History:  Procedure Laterality Date  . BREAST BIOPSY  1960   Left  . BREAST LUMPECTOMY  1960   left 2017  . BREAST LUMPECTOMY WITH RADIOACTIVE SEED LOCALIZATION Left  09/10/2015   Procedure: BREAST LUMPECTOMY WITH RADIOACTIVE SEED LOCALIZATION;  Surgeon: BExcell Seltzer MD;  [place]  Service: General;  Laterality: Left;  . CATARACT EXTRACTION Bilateral 2002   Dr. GKaty Fitch . CATARACT EXTRACTION, BILATERAL  2001  . INSERT / REPLACE / REMOVE PACEMAKER    . LOM HEMOTYMPANUM THROAT & SINUS BX  1977  . PACEMAKER GENERATOR CHANGE N/A 08/16/2012   Procedure: PACEMAKER GENERATOR CHANGE;  Surgeon: SDeboraha Sprang MD;  [facility];  Service: Cardiovascular;  Laterality: N/A;  . PACEMAKER INSERTION  2006   Medtronic DDDR E2DRO1, SERIAL #  [id number]  . surgery of ear      Allergies  Allergen Reactions  . Adhesive [Tape] Itching and Rash    Please use "paper" tape  . Codeine Nausea Only  . Triamterene-Hctz Other (See Comments)    fatigue  . Vasotec Diarrhea    Allergies as of 05/20/2020      Reactions   Adhesive [tape] Itching, Rash   Please use "paper" tape   Codeine Nausea Only   Triamterene-hctz Other (See Comments)   fatigue   Vasotec Diarrhea      Medication List       Accurate as of May 20, 2020 11:59 PM. If you have any questions, ask your nurse or doctor.  STOP taking these medications   hydrochlorothiazide 12.5 MG capsule Commonly known as: MICROZIDE Stopped by: Yamen Castrogiovanni X Yamilka Lopiccolo, NP     TAKE these medications   acetaminophen 500 MG tablet Commonly known as: TYLENOL Take 500 mg by mouth every 6 (six) hours as needed.   beta carotene w/minerals tablet Take 1 tablet by mouth daily.   levothyroxine 50 MCG tablet Commonly known as: SYNTHROID TAKE 1 TABLET ONCE DAILY.   lisinopril 20 MG tablet Commonly known as: ZESTRIL Take 1 tablet (20 mg total) by mouth daily. What changed:   medication strength  how much to take Changed by: Aleksis Jiggetts X Catrena Vari, NP   loperamide 2 MG tablet Commonly known as: IMODIUM A-D Take 2 mg by mouth daily as needed for diarrhea or loose stools.   metoprolol succinate 25 MG 24 hr  tablet Commonly known as: TOPROL-XL TAKE 1 TABLET ONCE DAILY.   Vitamin D-3 25 MCG (1000 UT) Caps Take 1,000 Units by mouth daily.       Review of Systems:  Review of Systems  Constitutional: Negative for fatigue, fever and unexpected weight change.  HENT: Positive for hearing loss. Negative for congestion and voice change.        Hearing aids.   Eyes: Negative for visual disturbance.       S/p inj, macular degeneration  Respiratory: Negative for cough and shortness of breath.   Cardiovascular: Negative for leg swelling.  Gastrointestinal: Negative for abdominal pain and constipation.  Genitourinary: Positive for frequency.       Every 2-3 hours at night  Musculoskeletal: Positive for arthralgias and gait problem.  Skin: Negative for color change.       Lateral mid right lower leg surgical scar  Neurological: Negative for dizziness, speech difficulty and weakness.       Tingling numbness in hands.   Psychiatric/Behavioral: Positive for sleep disturbance. Negative for behavioral problems. The patient is not nervous/anxious.        Due to frequent urination at night.     Health Maintenance  Topic Date Due  . COVID-19 Vaccine (3 - Moderna risk 4-dose series) 04/28/2019  . TETANUS/TDAP  05/01/2021  . INFLUENZA VACCINE  Completed  . PNA vac Low Risk Adult  Completed  . DEXA SCAN  Addressed  . HPV VACCINES  Aged Out    Physical Exam: Vitals:   05/20/20 1601  BP: (!) 144/80  Pulse: 96  Temp: (!) 97 F (36.1 C)  SpO2: 96%  Weight: 138 lb (62.6 kg)  Height: $Remove'5\' 3"'dLBBZnj$  (1.6 m)   Body mass index is 24.45 kg/m. Physical Exam Vitals and nursing note reviewed.  Constitutional:      Appearance: Normal appearance.  HENT:     Head: Normocephalic and atraumatic.     Mouth/Throat:     Mouth: Mucous membranes are moist.  Eyes:     Extraocular Movements: Extraocular movements intact.     Conjunctiva/sclera: Conjunctivae normal.     Pupils: Pupils are equal, round, and reactive  to light.     Comments: Mild ectropion R+L lower eyelids  Cardiovascular:     Rate and Rhythm: Normal rate and regular rhythm.     Heart sounds: Murmur heard.    Pulmonary:     Breath sounds: No rales.  Abdominal:     Palpations: Abdomen is soft.     Tenderness: There is no abdominal tenderness.  Musculoskeletal:        General: No tenderness.     Cervical  back: Normal range of motion and neck supple.     Right lower leg: No edema.     Left lower leg: No edema.     Comments: Trace edema RLE.  Ambulates with walker. Fullness left popliteal area  Skin:    General: Skin is warm and dry.     Comments: Mild chronic venous insufficiency skin changes BLE. A golf ball sized, hard, irregular, orange peel skin appearance. Opted out workup and tx.   Neurological:     General: No focal deficit present.     Mental Status: She is alert and oriented to person, place, and time. Mental status is at baseline.     Motor: No weakness.     Coordination: Coordination normal.     Gait: Gait abnormal.  Psychiatric:        Mood and Affect: Mood normal.        Behavior: Behavior normal.        Thought Content: Thought content normal.        Judgment: Judgment normal.     Labs reviewed: Basic Metabolic Panel: Recent Labs    07/22/19 0715 12/16/19 0700 04/29/20 0730  NA 139 140 141  K 3.7 4.2 3.9  CL 103 101 105  CO2 26 29 27   GLUCOSE 103* 97 89  BUN 34* 35* 42*  CREATININE 1.30* 1.58* 1.46*  CALCIUM 8.9 10.0 8.9  TSH 0.84  --  2.16   Liver Function Tests: Recent Labs    07/22/19 0715 12/16/19 0700 04/29/20 0730  AST 13 16 11   ALT 10 11 8   BILITOT 0.9 0.7 0.7  PROT 5.7* 6.5 5.8*   No results for input(s): LIPASE, AMYLASE in the last 8760 hours. No results for input(s): AMMONIA in the last 8760 hours. CBC: Recent Labs    07/22/19 0715 12/16/19 0700  WBC 11.0* 10.1  NEUTROABS 6,424 5,030  HGB 13.7 13.8  HCT 42.5 41.3  MCV 91.4 89.0  PLT 226 245   Lipid Panel: Recent  Labs    07/22/19 0715  CHOL 143  HDL 62  LDLCALC 64  TRIG 91  CHOLHDL 2.3   No results found for: HGBA1C  Procedures since last visit: Intravitreal Injection, Pharmacologic Agent - OD - Right Eye  Result Date: 05/11/2020 Time Out 05/11/2020. 11:05 AM. Confirmed correct patient, procedure, site, and patient consented. Anesthesia Topical anesthesia was used. Anesthetic medications included Akten 3.5%. Procedure Preparation included 10% betadine to eyelids, 5% betadine to ocular surface, Ofloxacin . A 30 gauge needle was used. Injection: 2 mg aflibercept Alfonse Flavors) SOLN   NDC: [id number], Lot: [id number]   Route: Intravitreal, Site: Right Eye, Waste: 0 mg Post-op Post injection exam found visual acuity of at least counting fingers. The patient tolerated the procedure well. There were no complications. Post injection medications were not given.   OCT, Retina - OU - Both Eyes  Result Date: 05/11/2020 Right Eye Quality was good. Scan locations included subfoveal. Central Foveal Thickness: 343. Progression has improved. Findings include abnormal foveal contour, retinal drusen , subretinal scarring, intraretinal fluid, subretinal fluid. Left Eye Quality was good. Scan locations included subfoveal. Central Foveal Thickness: 317. Progression has been stable. Findings include abnormal foveal contour, central retinal atrophy, outer retinal atrophy, no SRF, no IRF, subretinal scarring. Notes Visual acuity preserved OD yet with new subretinal fluid and intraretinal fluid at 12-week interval today, post injection Eylea repeat today and follow-up in 8 weeks  OCT, Retina - OU - Both Eyes  Result Date:  04/26/2020 Right Eye Quality was good. Scan locations included subfoveal. Central Foveal Thickness: 283. Progression has improved. Findings include abnormal foveal contour, retinal drusen , no SRF, subretinal scarring. Left Eye Quality was good. Scan locations included subfoveal. Central Foveal Thickness: 267.  Progression has been stable. Findings include abnormal foveal contour, central retinal atrophy, outer retinal atrophy, no SRF, no IRF, subretinal scarring. Notes Anatomy and visual acuity OD preserved and stable with much less CNVM activity at 10-week interval today We will repeat injection Eylea today to maintain and extend interval of examination to 3 months  Color Fundus Photography Optos - OU - Both Eyes  Result Date: 05/11/2020 Right Eye Disc findings include increased cup to disc ratio. Macula : retinal pigment epithelium abnormalities, geographic atrophy. Vessels : normal observations. Periphery : normal observations. Left Eye Progression has been stable. Disc findings include increased cup to disc ratio, pallor. Macula : geographic atrophy, retinal pigment epithelium abnormalities. Periphery : normal observations. Notes No retinal holes or tears.,  Incidental posterior vitreous detachment evident. Left eye with macular disciform scarring and also residual geographic atrophy subfoveal  Color Fundus Photography Optos - OU - Both Eyes  Result Date: 04/26/2020 Right Eye Disc findings include increased cup to disc ratio. Macula : retinal pigment epithelium abnormalities, geographic atrophy. Vessels : normal observations. Periphery : normal observations. Left Eye Progression has been stable. Disc findings include increased cup to disc ratio, pallor. Macula : geographic atrophy, retinal pigment epithelium abnormalities. Periphery : normal observations. Notes No retinal holes or tears.,  Incidental posterior vitreous detachment evident. Left eye with macular disciform scarring and also residual geographic atrophy subfoveal   Assessment/Plan  Essential hypertension  blood pressure is controlled dc HCTZ to possibly decrease uric acid level,  Increase Lisinopril 76m qd,  Metoprolol. Bun/creat 42/1.46 04/29/20   Generalized osteoarthritis of multiple sites multiple sites, prn Tylenol.     Hypothyroidism Hypothyroidism, stable, on Levothyroxine 53m qd. TSH 2.16 04/29/20   Kidney disease, chronic, stage IV (severe, EGFR 15-29 ml/min) (HCC) CKD creat 42/1.46 04/29/20    Edema of both lower extremities due to peripheral venous insufficiency Not apparent  Gout Asymptomatic, uric acid 7.5 12/16/19, treated with 30 day Allopurinol, repeated uric acid 8.2 04/29/20, will dc HCZ, repeat uric acid in 3 months.    Left breast mass Lateral, sized of a golf ball, hx of medial left breast cancer, opted out workup or tx.    Labs/tests ordered: none  Next appt:  4 months

## 2020-05-20 NOTE — Assessment & Plan Note (Addendum)
blood pressure is controlled dc HCTZ to possibly decrease uric acid level,  Increase Lisinopril 20mg  qd,  Metoprolol. Bun/creat 42/1.46 04/29/20

## 2020-05-20 NOTE — Assessment & Plan Note (Signed)
Hypothyroidism, stable, on Levothyroxine 28mcg qd. TSH 2.16 04/29/20

## 2020-05-20 NOTE — Assessment & Plan Note (Signed)
Lateral, sized of a golf ball, hx of medial left breast cancer, opted out workup or tx.

## 2020-05-20 NOTE — Assessment & Plan Note (Signed)
Asymptomatic, uric acid 7.5 12/16/19, treated with 30 day Allopurinol, repeated uric acid 8.2 04/29/20, will dc HCZ, repeat uric acid in 3 months.

## 2020-05-21 ENCOUNTER — Encounter: Payer: Self-pay | Admitting: Nurse Practitioner

## 2020-05-25 ENCOUNTER — Telehealth: Payer: Self-pay | Admitting: *Deleted

## 2020-05-25 NOTE — Telephone Encounter (Signed)
Patient called and stated that she was seen 05/20/2020 by [place]. Stated that she would like her or her CMA to give her a call about the appointment.  Patient stated that she is very confused about the medication instructions that was given to her.   Would like a call to [phone]

## 2020-05-25 NOTE — Telephone Encounter (Signed)
Mast, Man X, NP  You 6 minutes ago (11:37 AM)   Please call the patient to clarify her medication changes: stop taking HCTZ, increases Lisinopril to 20mg  po qd, f/u in clinic in 2 weeks. Thanks.    Message text       Patient notified and agreed. Stated that she hasn't received Rx from [facility], patient stated that she is going to call [facility] for Rx.

## 2020-06-09 ENCOUNTER — Ambulatory Visit (INDEPENDENT_AMBULATORY_CARE_PROVIDER_SITE_OTHER): Payer: Medicare Other

## 2020-06-09 DIAGNOSIS — I442 Atrioventricular block, complete: Secondary | ICD-10-CM

## 2020-06-10 ENCOUNTER — Other Ambulatory Visit: Payer: Self-pay

## 2020-06-10 ENCOUNTER — Other Ambulatory Visit: Payer: Self-pay | Admitting: Nurse Practitioner

## 2020-06-10 DIAGNOSIS — M1A9XX1 Chronic gout, unspecified, with tophus (tophi): Secondary | ICD-10-CM

## 2020-06-10 DIAGNOSIS — I10 Essential (primary) hypertension: Secondary | ICD-10-CM | POA: Diagnosis not present

## 2020-06-10 LAB — CUP PACEART REMOTE DEVICE CHECK
Battery Impedance: 2088 Ohm
Battery Remaining Longevity: 27 mo
Battery Voltage: 2.74 V
Brady Statistic AP VP Percent: 18 %
Brady Statistic AP VS Percent: 0 %
Brady Statistic AS VP Percent: 82 %
Brady Statistic AS VS Percent: 0 %
Date Time Interrogation Session: 20220413152845
Implantable Lead Implant Date: 20060203
Implantable Lead Implant Date: 20060203
Implantable Lead Location: 753859
Implantable Lead Location: 753860
Implantable Lead Model: 4469
Implantable Lead Model: 4470
Implantable Lead Serial Number: 458590
Implantable Lead Serial Number: 500613
Implantable Pulse Generator Implant Date: 20140620
Lead Channel Impedance Value: 396 Ohm
Lead Channel Impedance Value: 480 Ohm
Lead Channel Pacing Threshold Amplitude: 0.75 V
Lead Channel Pacing Threshold Amplitude: 1.25 V
Lead Channel Pacing Threshold Pulse Width: 0.4 ms
Lead Channel Pacing Threshold Pulse Width: 0.4 ms
Lead Channel Setting Pacing Amplitude: 2.5 V
Lead Channel Setting Pacing Amplitude: 2.5 V
Lead Channel Setting Pacing Pulse Width: 0.4 ms
Lead Channel Setting Sensing Sensitivity: 2.8 mV

## 2020-06-11 LAB — COMPLETE METABOLIC PANEL WITH GFR
AG Ratio: 1.6 (calc) (ref 1.0–2.5)
ALT: 12 U/L (ref 6–29)
AST: 13 U/L (ref 10–35)
Albumin: 3.7 g/dL (ref 3.6–5.1)
Alkaline phosphatase (APISO): 73 U/L (ref 37–153)
BUN/Creatinine Ratio: 25 (calc) — ABNORMAL HIGH (ref 6–22)
BUN: 32 mg/dL — ABNORMAL HIGH (ref 7–25)
CO2: 23 mmol/L (ref 20–32)
Calcium: 9 mg/dL (ref 8.6–10.4)
Chloride: 107 mmol/L (ref 98–110)
Creat: 1.27 mg/dL — ABNORMAL HIGH (ref 0.60–0.88)
GFR, Est African American: 40 mL/min/{1.73_m2} — ABNORMAL LOW (ref >=60)
GFR, Est Non African American: 34 mL/min/{1.73_m2} — ABNORMAL LOW (ref >=60)
Globulin: 2.3 g/dL (calc) (ref 1.9–3.7)
Glucose, Bld: 85 mg/dL (ref 65–99)
Potassium: 4.4 mmol/L (ref 3.5–5.3)
Sodium: 144 mmol/L (ref 135–146)
Total Bilirubin: 0.6 mg/dL (ref 0.2–1.2)
Total Protein: 6 g/dL — ABNORMAL LOW (ref 6.1–8.1)

## 2020-06-11 LAB — URIC ACID: Uric Acid, Serum: 7.2 mg/dL — ABNORMAL HIGH (ref 2.5–7.0)

## 2020-06-17 ENCOUNTER — Encounter: Payer: Self-pay | Admitting: Nurse Practitioner

## 2020-06-17 ENCOUNTER — Non-Acute Institutional Stay: Payer: Medicare Other | Admitting: Nurse Practitioner

## 2020-06-17 ENCOUNTER — Other Ambulatory Visit: Payer: Self-pay

## 2020-06-17 DIAGNOSIS — I10 Essential (primary) hypertension: Secondary | ICD-10-CM

## 2020-06-17 DIAGNOSIS — M1A9XX1 Chronic gout, unspecified, with tophus (tophi): Secondary | ICD-10-CM

## 2020-06-17 DIAGNOSIS — I872 Venous insufficiency (chronic) (peripheral): Secondary | ICD-10-CM

## 2020-06-17 DIAGNOSIS — N6320 Unspecified lump in the left breast, unspecified quadrant: Secondary | ICD-10-CM

## 2020-06-17 DIAGNOSIS — N1832 Chronic kidney disease, stage 3b: Secondary | ICD-10-CM | POA: Diagnosis not present

## 2020-06-17 DIAGNOSIS — E039 Hypothyroidism, unspecified: Secondary | ICD-10-CM

## 2020-06-17 DIAGNOSIS — M159 Polyosteoarthritis, unspecified: Secondary | ICD-10-CM

## 2020-06-17 NOTE — Assessment & Plan Note (Signed)
multiple sites, prn Tylenol.

## 2020-06-17 NOTE — Assessment & Plan Note (Signed)
CKD Bun/creat 32/1.27 06/10/20

## 2020-06-17 NOTE — Assessment & Plan Note (Addendum)
Trace, chronic, off HCTZ due to elevated uric acid. Will observe.

## 2020-06-17 NOTE — Assessment & Plan Note (Signed)
Hypothyroidism, stable, on Levothyroxine 74mcg qd. TSH 2.16 04/29/20

## 2020-06-17 NOTE — Assessment & Plan Note (Signed)
blood pressure is controlled on Lisinopril,  Metoprolol. Bun/creat 32/1.27 06/10/20

## 2020-06-17 NOTE — Progress Notes (Signed)
Location:   clinic [place]   Place of Service:  Clinic (12) Provider: Marlana Latus NP  Code Status: DNR Goals of Care: [place] 06/17/2020  Does Patient Have a Medical Advance Directive? Yes  Type of Paramedic of [place];Living will  Does patient want to make changes to medical advance directive? No - Patient declined  Copy of [place] in Chart? No - copy requested  Pre-existing out of facility DNR order (yellow form or pink MOST form) -     Chief Complaint  Patient presents with  . Follow-up    4 week follow-up, discuss labs (copy printed). Here with [place]     HPI: Patient is a 87 y.o. female seen today for medical management of chronic diseases.    OA, multiple sites, prn Tylenol.  HTN, blood pressure is controlled on Lisinopril,  Metoprolol. Bun/creat 32/1.27 06/10/20 Hypothyroidism, stable, on Levothyroxine 59mcg qd. TSH 2.16 04/29/20 CKD Bun/creat 32/1.27 06/10/20             Gout asymptomatic, uric acid 7.5 12/16/19, treated with 30 day Allopurinol, repeated uric acid 8.2 04/29/20, dc'd HCZ, repeat uric acid 7.2 06/10/20  BLE edema, chronic, trace.   Past Medical History:  Diagnosis Date  . Breast cancer ([place])   . Cardiac pacemaker in situ 04/01/2004   Original implant 04/01/04 Dr. Doreatha Lew Indications syncope with Mobitz 2 heart block  RV lead Guidant 4470 52 cm bipolar lead, SN [id number] atrial lead Guidant bipolar serial #[id number].  Medtronic EnPulse Model number [id number], SN PNB [id number]   . Carotid bruit 10/25/2010   right  . Diverticulosis   . H/O syncope   . 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 Hiatal hernia   . HTN (hypertension), benign   . Hyperlipidemia   . Melanoma of back ([place]) 2009  . Mobitz II   . Osteoporosis     Past Surgical History:  Procedure Laterality Date  . BREAST BIOPSY  1960   Left  . BREAST LUMPECTOMY  1960   left 2017  . BREAST LUMPECTOMY  WITH RADIOACTIVE SEED LOCALIZATION Left 09/10/2015   Procedure: BREAST LUMPECTOMY WITH RADIOACTIVE SEED LOCALIZATION;  Surgeon: Excell Seltzer, MD;  Location: [place];  Service: General;  Laterality: Left;  . CATARACT EXTRACTION Bilateral 2002   Dr. Katy Fitch  . CATARACT EXTRACTION, BILATERAL  2001  . INSERT / REPLACE / REMOVE PACEMAKER    . LOM HEMOTYMPANUM THROAT & SINUS BX  1977  . PACEMAKER GENERATOR CHANGE N/A 08/16/2012   Procedure: PACEMAKER GENERATOR CHANGE;  Surgeon: Deboraha Sprang, MD;  Location: [facility] CATH LAB;  Service: Cardiovascular;  Laterality: N/A;  . PACEMAKER INSERTION  2006   Medtronic DDDR E2DRO1, SERIAL #  [id number]  . surgery of ear      Allergies  Allergen Reactions  . Adhesive [Tape] Itching and Rash    Please use "paper" tape  . Codeine Nausea Only  . Triamterene-Hctz Other (See Comments)    fatigue  . Vasotec Diarrhea    Allergies as of 06/17/2020      Reactions   Adhesive [tape] Itching, Rash   Please use "paper" tape   Codeine Nausea Only   Triamterene-hctz Other (See Comments)   fatigue   Vasotec Diarrhea      Medication List       Accurate as of June 17, 2020 11:59 PM. If you have any questions, ask your nurse or doctor.  acetaminophen 500 MG tablet Commonly known as: TYLENOL Take 500 mg by mouth every 6 (six) hours as needed.   beta carotene w/minerals tablet Take 1 tablet by mouth daily.   levothyroxine 50 MCG tablet Commonly known as: SYNTHROID TAKE 1 TABLET ONCE DAILY.   lisinopril 20 MG tablet Commonly known as: ZESTRIL Take 1 tablet (20 mg total) by mouth daily.   loperamide 2 MG tablet Commonly known as: IMODIUM A-D Take 2 mg by mouth daily as needed for diarrhea or loose stools.   metoprolol succinate 25 MG 24 hr tablet Commonly known as: TOPROL-XL TAKE 1 TABLET ONCE DAILY.   Vitamin D-3 25 MCG (1000 UT) Caps Take 1,000 Units by mouth daily.       Review of Systems:  Review of Systems  Constitutional:  Negative for fatigue, fever and unexpected weight change.  HENT: Positive for hearing loss. Negative for congestion and voice change.        Hearing aids.   Eyes: Negative for visual disturbance.       S/p inj, macular degeneration  Respiratory: Negative for cough and shortness of breath.   Cardiovascular: Positive for leg swelling.  Gastrointestinal: Negative for abdominal pain and constipation.  Genitourinary: Positive for frequency.       Every 2-3 hours at night  Musculoskeletal: Positive for arthralgias and gait problem.  Skin: Negative for color change.       Lateral mid right lower leg surgical scar  Neurological: Negative for dizziness, speech difficulty and weakness.       Tingling numbness in hands.   Psychiatric/Behavioral: Positive for sleep disturbance. Negative for behavioral problems. The patient is not nervous/anxious.        Due to frequent urination at night.     Health Maintenance  Topic Date Due  . COVID-19 Vaccine (4 - Booster for Moderna series) 07/05/2020  . INFLUENZA VACCINE  09/27/2020  . TETANUS/TDAP  05/01/2021  . PNA vac Low Risk Adult  Completed  . DEXA SCAN  Addressed  . HPV VACCINES  Aged Out    Physical Exam: Vitals:   06/17/20 1338  BP: (!) 142/82  Pulse: 71  Temp: 97.8 F (36.6 C)  TempSrc: Temporal  SpO2: 97%  Weight: 140 lb (63.5 kg)  Height: 5\' 3"  (1.6 m)   Body mass index is 24.8 kg/m. Physical Exam Vitals and nursing note reviewed.  Constitutional:      Appearance: Normal appearance.  HENT:     Head: Normocephalic and atraumatic.     Mouth/Throat:     Mouth: Mucous membranes are moist.  Eyes:     Extraocular Movements: Extraocular movements intact.     Conjunctiva/sclera: Conjunctivae normal.     Pupils: Pupils are equal, round, and reactive to light.     Comments: Mild ectropion R+L lower eyelids  Cardiovascular:     Rate and Rhythm: Normal rate and regular rhythm.     Heart sounds: Murmur heard.    Pulmonary:      Breath sounds: No rales.  Abdominal:     Palpations: Abdomen is soft.     Tenderness: There is no abdominal tenderness.  Musculoskeletal:        General: No tenderness.     Cervical back: Normal range of motion and neck supple.     Right lower leg: Edema present.     Left lower leg: Edema present.     Comments: Trace edema RLE.  Ambulates with walker. Fullness left popliteal area  Skin:  General: Skin is warm and dry.     Comments: Mild chronic venous insufficiency skin changes BLE. A golf ball sized, hard, irregular, orange peel skin appearance left breast.  Opted out workup and tx.   Neurological:     General: No focal deficit present.     Mental Status: She is alert and oriented to person, place, and time. Mental status is at baseline.     Motor: No weakness.     Coordination: Coordination normal.     Gait: Gait abnormal.  Psychiatric:        Mood and Affect: Mood normal.        Behavior: Behavior normal.        Thought Content: Thought content normal.        Judgment: Judgment normal.     Labs reviewed: Basic Metabolic Panel: Recent Labs    07/22/19 0715 12/16/19 0700 04/29/20 0730 06/10/20 0835  NA 139 140 141 144  K 3.7 4.2 3.9 4.4  CL 103 101 105 107  CO2 26 29 27 23   GLUCOSE 103* 97 89 85  BUN 34* 35* 42* 32*  CREATININE 1.30* 1.58* 1.46* 1.27*  CALCIUM 8.9 10.0 8.9 9.0  TSH 0.84  --  2.16  --    Liver Function Tests: Recent Labs    12/16/19 0700 04/29/20 0730 06/10/20 0835  AST 16 11 13   ALT 11 8 12   BILITOT 0.7 0.7 0.6  PROT 6.5 5.8* 6.0*   No results for input(s): LIPASE, AMYLASE in the last 8760 hours. No results for input(s): AMMONIA in the last 8760 hours. CBC: Recent Labs    07/22/19 0715 12/16/19 0700  WBC 11.0* 10.1  NEUTROABS 6,424 5,030  HGB 13.7 13.8  HCT 42.5 41.3  MCV 91.4 89.0  PLT 226 245   Lipid Panel: Recent Labs    07/22/19 0715  CHOL 143  HDL 62  LDLCALC 64  TRIG 91  CHOLHDL 2.3   No results found for:  HGBA1C  Procedures since last visit: CUP PACEART REMOTE DEVICE CHECK  Result Date: 06/10/2020 Scheduled remote reviewed. 559 AHR events longest logged event 6:04 minutes.  Burden 0.3%.  3 VHR events 3-5 seconds.  Histogram controlled.  Known PAF.  Meds: Toprol XL.  Per Epic OV 02/12/20 patient is not anticoagulated due to fall risk at the low A. fib burden.  Normal device function.  R. Powers, CVRS Next remote 91 days.   Assessment/Plan  CKD (chronic kidney disease) stage 3, GFR 30-59 ml/min (HCC) CKD Bun/creat 32/1.27 06/10/20   Gout Gout asymptomatic, uric acid 7.5 12/16/19, treated with 30 day Allopurinol, repeated uric acid 8.2 04/29/20, dc'd HCZ, repeat uric acid 7.2 06/10/20  Hypothyroidism Hypothyroidism, stable, on Levothyroxine 49mcg qd. TSH 2.16 04/29/20   Essential hypertension blood pressure is controlled on Lisinopril,  Metoprolol. Bun/creat 32/1.27 06/10/20  Generalized osteoarthritis of multiple sites multiple sites, prn Tylenol.   Edema of both lower extremities due to peripheral venous insufficiency Trace, chronic, off HCTZ due to elevated uric acid. Will observe.   Left breast mass Left breast mass Lateral, sized of a golf ball, hx of medial left breast cancer, opted out workup or tx.     Labs/tests ordered:  None  Next appt:  4-5 months

## 2020-06-17 NOTE — Assessment & Plan Note (Signed)
Gout asymptomatic, uric acid 7.5 12/16/19, treated with 30 day Allopurinol, repeated uric acid 8.2 04/29/20, dc'd HCZ, repeat uric acid 7.2 06/10/20

## 2020-06-17 NOTE — Assessment & Plan Note (Signed)
Left breast mass Lateral, sized of a golf ball, hx of medial left breast cancer, opted out workup or tx.

## 2020-06-18 ENCOUNTER — Encounter: Payer: Self-pay | Admitting: Nurse Practitioner

## 2020-06-24 NOTE — Progress Notes (Signed)
Remote pacemaker transmission.   

## 2020-07-06 ENCOUNTER — Other Ambulatory Visit: Payer: Self-pay

## 2020-07-06 ENCOUNTER — Encounter (INDEPENDENT_AMBULATORY_CARE_PROVIDER_SITE_OTHER): Payer: Self-pay | Admitting: Ophthalmology

## 2020-07-06 ENCOUNTER — Ambulatory Visit (INDEPENDENT_AMBULATORY_CARE_PROVIDER_SITE_OTHER): Payer: Medicare Other | Admitting: Ophthalmology

## 2020-07-06 DIAGNOSIS — H353211 Exudative age-related macular degeneration, right eye, with active choroidal neovascularization: Secondary | ICD-10-CM

## 2020-07-06 DIAGNOSIS — H353124 Nonexudative age-related macular degeneration, left eye, advanced atrophic with subfoveal involvement: Secondary | ICD-10-CM | POA: Diagnosis not present

## 2020-07-06 NOTE — Assessment & Plan Note (Signed)
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 The results of the AREDS 2 study was discussed with the patient. A diet rich in dark leafy [place] vegetables was advised and specific recommendations were made regarding supplements with AREDS 2 formulation . Control of hypertension and serum cholesterol may slow the disease. Smoking cessation is mandatory to slow the disease and diminish the risk of progressing to wet age related macular degeneration. The patient was instructed in the use of an [place] and was told to return immediately for any changes in the Grid. Stressed to the patient do not rub eyes  OS with dry atrophic change subfoveal accounts for acuity

## 2020-07-06 NOTE — Assessment & Plan Note (Signed)
The nature of wet macular degeneration was discussed with the patient.  Forms of therapy reviewed include the use of Anti-VEGF medications injected painlessly into the eye, as well as other possible treatment modalities, including thermal laser therapy. Fellow eye involvement and risks were discussed with the patient. Upon the finding of wet age related macular degeneration, treatment will be offered. The treatment regimen is on a treat as needed basis with the intent to treat if necessary and extend interval of exams when possible. On average 1 out of 6 patients do not need lifetime therapy. However, the risk of recurrent disease is high for a lifetime.  Initially monthly, then periodic, examinations and evaluations will determine whether the next treatment is required on the day of the examination.  OD overall improved at shorter interval of 8 weeks as compared to 12 weeks previous.  Less subretinal fluid today.  Repeat intravitreal Eylea and examination next in 8 to 9 weeks

## 2020-07-06 NOTE — Progress Notes (Addendum)
07/06/2020     CHIEF COMPLAINT Patient presents for Retina Follow Up (8 Wk F/U OD, poss Eylea OD//Pt sts [facility] OD is "getting a little worse" since last visit. No changes OS. No other new symptoms reported OU.)   HISTORY OF PRESENT ILLNESS: [name] [name] is a 87 y.o. female who presents to the clinic today for:   HPI    Retina Follow Up    Diagnosis: Wet AMD   Laterality: right eye   Onset: 8 weeks ago   Severity: moderate   Duration: 8 weeks   Course: gradually worsening   Comments: 8 Wk F/U OD, poss Eylea OD  Pt sts VA OD is "getting a little worse" since last visit. No changes OS. No other new symptoms reported OU.       Last edited by Rockie Neighbours, Isabel[place] on 07/06/2020  9:58 AM. (History)      Referring physician: Mast, Man X, NP [address],  [place] [address]  HISTORICAL INFORMATION:   Selected notes from the MEDICAL RECORD NUMBER       CURRENT MEDICATIONS: No current outpatient medications on file. (Ophthalmic Drugs)   No current facility-administered medications for this visit. (Ophthalmic Drugs)   Current Outpatient Medications (Other)  Medication Sig  . acetaminophen (TYLENOL) 500 MG tablet Take 500 mg by mouth every 6 (six) hours as needed.  . beta carotene w/minerals (OCUVITE) tablet Take 1 tablet by mouth daily.  . Cholecalciferol (VITAMIN D-3) 1000 units CAPS Take 1,000 Units by mouth daily.  Marland Kitchen levothyroxine (SYNTHROID) 50 MCG tablet TAKE 1 TABLET ONCE DAILY.  Marland Kitchen lisinopril (ZESTRIL) 20 MG tablet Take 1 tablet (20 mg total) by mouth daily.  Marland Kitchen loperamide (IMODIUM A-D) 2 MG tablet Take 2 mg by mouth daily as needed for diarrhea or loose stools.  . metoprolol succinate (TOPROL-XL) 25 MG 24 hr tablet TAKE 1 TABLET ONCE DAILY.   No current facility-administered medications for this visit. (Other)      REVIEW OF SYSTEMS:    ALLERGIES Allergies  Allergen Reactions  . Adhesive [Tape] Itching and Rash    Please use "paper" tape  . Codeine  Nausea Only  . Triamterene-Hctz Other (See Comments)    fatigue  . Vasotec Diarrhea    PAST MEDICAL HISTORY Past Medical History:  Diagnosis Date  . Breast cancer ([place])   . Cardiac pacemaker in situ 04/01/2004   Original implant 04/01/04 Dr. Doreatha Lew Indications syncope with Mobitz 2 heart block  RV lead Guidant 4470 52 cm bipolar lead, SN [id number] atrial lead Guidant bipolar serial #[id number].  Medtronic EnPulse Model number [id number], SN PNB [id number]   . Carotid bruit 10/25/2010   right  . Diverticulosis   . H/O syncope   . 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 Hiatal hernia   . HTN (hypertension), benign   . Hyperlipidemia   . Melanoma of back ([place]) 2009  . Mobitz II   . Osteoporosis    Past Surgical History:  Procedure Laterality Date  . BREAST BIOPSY  1960   Left  . BREAST LUMPECTOMY  1960   left 2017  . BREAST LUMPECTOMY WITH RADIOACTIVE SEED LOCALIZATION Left 09/10/2015   Procedure: BREAST LUMPECTOMY WITH RADIOACTIVE SEED LOCALIZATION;  Surgeon: Excell Seltzer, MD;  Location: [facility];  Service: General;  Laterality: Left;  . CATARACT EXTRACTION Bilateral 2002   Dr. Katy Fitch  . CATARACT EXTRACTION, BILATERAL  2001  . INSERT / REPLACE / REMOVE PACEMAKER    .  LOM HEMOTYMPANUM THROAT & SINUS BX  1977  . PACEMAKER GENERATOR CHANGE N/A 08/16/2012   Procedure: PACEMAKER GENERATOR CHANGE;  Surgeon: Deboraha Sprang, MD;  Location: [facility] CATH LAB;  Service: Cardiovascular;  Laterality: N/A;  . PACEMAKER INSERTION  2006   Medtronic DDDR [id number], SERIAL #  [id number]  . surgery of ear      FAMILY HISTORY Family History  Problem Relation Age of Onset  . Stroke Mother 85  . Kidney disease Father 75  . Cirrhosis Son     SOCIAL HISTORY Social History   Tobacco Use  . Smoking status: Former Smoker    Packs/day: 0.33    Years: 10.00    Pack years: 3.30    Types: Cigarettes    Quit date: 02/27/1946    Years since quitting: 74.4  . Smokeless tobacco: Never Used  Vaping Use  .  Vaping Use: Never used  Substance Use Topics  . Alcohol use: Yes    Alcohol/week: 7.0 standard drinks    Types: 7 Glasses of wine per week    Comment: wine  . Drug use: No         OPHTHALMIC EXAM:  Base Eye Exam    Visual Acuity (ETDRS)      Right Left   Dist cc 20/60 -2 CF @ 1'   Dist ph cc NI NI   Correction: Glasses       Tonometry (Tonopen, 10:02 AM)      Right Left   Pressure 13 13       Pupils      Pupils Dark Light Shape React APD   Right PERRL 5 4 Round Sluggish None   Left PERRL 5 4 Round Sluggish None       Visual Fields (Counting fingers)      Left Right     Full   Restrictions Partial outer superior temporal, superior nasal deficiencies        Extraocular Movement      Right Left    Full Full       Neuro/Psych    Oriented x3: Yes   Mood/Affect: Normal       Dilation    Right eye: 1.0% Mydriacyl, 2.5% Phenylephrine @ 10:02 AM        Slit Lamp and Fundus Exam    External Exam      Right Left   External Normal Normal       Slit Lamp Exam      Right Left   Lids/Lashes Normal Normal   Conjunctiva/Sclera White and quiet White and quiet   Cornea Clear Clear   Anterior Chamber Deep and quiet Deep and quiet   Iris Round and reactive Round and reactive   Lens Open posterior capsule Posterior chamber intraocular lens   Anterior Vitreous Normal Normal       Fundus Exam      Right Left   Posterior Vitreous Posterior vitreous detachment    Disc Normal    C/D Ratio 0.5    Macula Atrophy, Retinal pigment epithelial atrophy, Age related macular degeneration, Early age related macular degeneration, Drusen, no exudates, no hemorrhage, no macular thickening, Geographic atrophy 3 DA size not in the FAZ, reddish hue temporal    Vessels Normal    Periphery Normal, no holes or tears, 28D and 20 D examination to the ora serrata           IMAGING AND PROCEDURES  Imaging and Procedures for 07/08/20  OCT,  Retina - OU - Both Eyes       Right  Eye Quality was good. Scan locations included subfoveal. Central Foveal Thickness: 288. Progression has improved. Findings include abnormal foveal contour, retinal drusen , subretinal scarring, subretinal hyper-reflective material, no IRF.   Left Eye Quality was good. Scan locations included subfoveal. Central Foveal Thickness: 306. Progression has been stable. Findings include abnormal foveal contour, central retinal atrophy, outer retinal atrophy.   Notes Visual acuity preserved OD with less subretinal fluid today at 8-week follow-up.  We will repeat injection today and maintain 8 to 9-week interval follow-up exams        Intravitreal Injection, Pharmacologic Agent - OD - Right Eye       Time Out 07/06/2020. 1:32 PM. Confirmed correct patient, procedure, site, and patient consented.   Anesthesia Topical anesthesia was used. Anesthetic medications included Akten 3.5%.   Procedure Preparation included 10% betadine to eyelids, 5% betadine to ocular surface, Ofloxacin . A 30 gauge needle was used.   Injection:  2.5 mg Bevacizumab (AVASTIN) 2.5mg /0.43mL SOSY   NDC: 90300-923-30, Lot: [id number]   Route: Intravitreal, Site: Right Eye  Post-op Post injection exam found visual acuity of at least counting fingers. The patient tolerated the procedure well. There were no complications. Post injection medications were not given.                 ASSESSMENT/PLAN:  Exudative age-related macular degeneration of right eye with active choroidal neovascularization (HCC) The nature of wet macular degeneration was discussed with the patient.  Forms of therapy reviewed include the use of Anti-VEGF medications injected painlessly into the eye, as well as other possible treatment modalities, including thermal laser therapy. Fellow eye involvement and risks were discussed with the patient. Upon the finding of wet age related macular degeneration, treatment will be offered. The treatment regimen is on  a treat as needed basis with the intent to treat if necessary and extend interval of exams when possible. On average 1 out of 6 patients do not need lifetime therapy. However, the risk of recurrent disease is high for a lifetime.  Initially monthly, then periodic, examinations and evaluations will determine whether the next treatment is required on the day of the examination.  OD overall improved at shorter interval of 8 weeks as compared to 12 weeks previous.  Less subretinal fluid today.  Repeat intravitreal Eylea and examination next in 8 to 9 weeks  Advanced nonexudative age-related macular degeneration of left eye with subfoveal involvement The nature of dry age related macular degeneration was discussed with the patient as well as its possible conversion to wet. The results of the AREDS 2 study was discussed with the patient. A diet rich in dark leafy [place] vegetables was advised and specific recommendations were made regarding supplements with AREDS 2 formulation . Control of hypertension and serum cholesterol may slow the disease. Smoking cessation is mandatory to slow the disease and diminish the risk of progressing to wet age related macular degeneration. The patient was instructed in the use of an [place] and was told to return immediately for any changes in the Grid. Stressed to the patient do not rub eyes  OS with dry atrophic change subfoveal accounts for acuity      ICD-10-CM   1. Exudative age-related macular degeneration of right eye with active choroidal neovascularization (HCC)  H35.3211 OCT, Retina - OU - Both Eyes    Intravitreal Injection, Pharmacologic Agent - OD - Right Eye  bevacizumab (AVASTIN) SOSY 2.5 mg  2. 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  OD, overall stable acuity and macular findings now on intravitreal Eylea with less subretinal fluid at shorter interval of 8 weeks.  Repeat injection  Eylea OD today  2.  3.  Ophthalmic Meds Ordered this visit:  Meds ordered this encounter  Medications  . bevacizumab (AVASTIN) SOSY 2.5 mg       Return in about 8 weeks (around 08/31/2020) for dilate, OD, EYLEA OCT.  There are no Patient Instructions on file for this visit.   Explained the diagnoses, plan, and follow up with the patient and they expressed understanding.  Patient expressed understanding of the importance of proper follow up care.   Clent Demark Duyen Beckom M.D. Diseases & Surgery of the Retina and Vitreous [facility] [place] 07/08/20     Abbreviations: M myopia (nearsighted); A astigmatism; H hyperopia (farsighted); P presbyopia; Mrx spectacle prescription;  CTL contact lenses; OD right eye; OS left eye; OU both eyes  XT exotropia; ET esotropia; PEK punctate epithelial keratitis; PEE punctate epithelial erosions; DES dry eye syndrome; MGD meibomian gland dysfunction; ATs artificial tears; PFAT's preservative free artificial tears; [place] nuclear sclerotic cataract; PSC posterior subcapsular cataract; ERM epi-retinal membrane; PVD posterior vitreous detachment; RD retinal detachment; DM diabetes mellitus; DR diabetic retinopathy; NPDR non-proliferative diabetic retinopathy; PDR proliferative diabetic retinopathy; CSME clinically significant macular edema; DME diabetic macular edema; dbh dot blot hemorrhages; CWS cotton wool spot; POAG primary open angle glaucoma; C/D cup-to-disc ratio; HVF humphrey visual field; GVF goldmann visual field; OCT optical coherence tomography; IOP intraocular pressure; BRVO Branch retinal vein occlusion; CRVO central retinal vein occlusion; CRAO central retinal artery occlusion; BRAO branch retinal artery occlusion; RT retinal tear; SB scleral buckle; PPV pars plana vitrectomy; VH Vitreous hemorrhage; PRP panretinal laser photocoagulation; IVK intravitreal kenalog; VMT vitreomacular traction; MH Macular hole;  NVD neovascularization of the disc;  NVE neovascularization elsewhere; AREDS age related eye disease study; ARMD age related macular degeneration; POAG primary open angle glaucoma; EBMD epithelial/anterior basement membrane dystrophy; ACIOL anterior chamber intraocular lens; IOL intraocular lens; PCIOL posterior chamber intraocular lens; Phaco/IOL phacoemulsification with intraocular lens placement; [place] photorefractive keratectomy; LASIK laser assisted in situ keratomileusis; HTN hypertension; DM diabetes mellitus; COPD chronic obstructive pulmonary disease

## 2020-07-08 DIAGNOSIS — H353211 Exudative age-related macular degeneration, right eye, with active choroidal neovascularization: Secondary | ICD-10-CM | POA: Diagnosis not present

## 2020-07-08 MED ORDER — BEVACIZUMAB 2.5 MG/0.1ML IZ SOSY
2.5000 mg | PREFILLED_SYRINGE | INTRAVITREAL | Status: AC | PRN
  Administered 2020-07-08: 2.5 mg via INTRAVITREAL

## 2020-07-28 ENCOUNTER — Encounter: Payer: Medicare Other | Admitting: Family Medicine

## 2020-07-28 ENCOUNTER — Telehealth: Payer: Self-pay

## 2020-07-28 ENCOUNTER — Other Ambulatory Visit: Payer: Self-pay

## 2020-07-28 NOTE — Telephone Encounter (Signed)
Incoming call received from patient stating she needs to cancel appointment today to be evaluated for leg swelling, as she had her 2nd booster yesterday and is not feeling well.  Patient states she still has the leg swelling and would like to be seen at the [facility] clinic tomorrow, if possible. Manxie, NP schedule is booked for tomorrow. Patient refused appointment at the [facility] office on [address].   Next available appointment is next Thursday (08/05/20) with Manxie  I spoke with the practice administrator to see if there was a way to accomodate patient and was instructed to send a message to [place] for further recommendations.   Please advise

## 2020-08-05 ENCOUNTER — Non-Acute Institutional Stay: Payer: Medicare Other | Admitting: Nurse Practitioner

## 2020-08-05 ENCOUNTER — Encounter: Payer: Self-pay | Admitting: Nurse Practitioner

## 2020-08-05 ENCOUNTER — Other Ambulatory Visit: Payer: Self-pay

## 2020-08-05 DIAGNOSIS — I872 Venous insufficiency (chronic) (peripheral): Secondary | ICD-10-CM

## 2020-08-05 DIAGNOSIS — N1832 Chronic kidney disease, stage 3b: Secondary | ICD-10-CM

## 2020-08-05 DIAGNOSIS — M159 Polyosteoarthritis, unspecified: Secondary | ICD-10-CM

## 2020-08-05 DIAGNOSIS — I10 Essential (primary) hypertension: Secondary | ICD-10-CM | POA: Diagnosis not present

## 2020-08-05 DIAGNOSIS — E039 Hypothyroidism, unspecified: Secondary | ICD-10-CM

## 2020-08-05 DIAGNOSIS — M1A9XX1 Chronic gout, unspecified, with tophus (tophi): Secondary | ICD-10-CM

## 2020-08-05 MED ORDER — FUROSEMIDE 20 MG PO TABS: 10.0000 mg | ORAL_TABLET | ORAL | 3 refills | Status: DC

## 2020-08-05 MED ORDER — POTASSIUM CHLORIDE CRYS ER 20 MEQ PO TBCR: 10.0000 meq | EXTENDED_RELEASE_TABLET | ORAL | 3 refills | Status: DC

## 2020-08-05 NOTE — Assessment & Plan Note (Signed)
blood pressure is controlled on Lisinopril,  Metoprolol. Bun/creat 32/1.27 06/10/20

## 2020-08-05 NOTE — Assessment & Plan Note (Signed)
asymptomatic, uric acid 7.5 12/16/19, treated with 30 day Allopurinol, repeated uric acid 8.2 04/29/20, dc'd HCZ, repeat uric acid 7.2 06/10/20

## 2020-08-05 NOTE — Assessment & Plan Note (Signed)
stable, on Levothyroxine 70mcg qd. TSH 2.16 04/29/20

## 2020-08-05 NOTE — Assessment & Plan Note (Addendum)
chronic, trace. Denied SOB, cough, sputum production, chest pain/plapitation. Off HCTZ due to elevated uric acid.

## 2020-08-05 NOTE — Assessment & Plan Note (Signed)
multiple sites, prn Tylenol.

## 2020-08-05 NOTE — Progress Notes (Signed)
Location:   clinic [place]   Place of Service:  Clinic (12) Provider: Marlana Latus NP  Code Status: DNR Goals of Care: [place] Advanced [place]ives 08/05/2020  Does Patient Have a Medical Advance Directive? Yes  Type of Advance Directive -  Does patient want to make changes to medical advance directive? No - Patient declined  Copy of [place] in Chart? -  Pre-existing out of facility DNR order (yellow form or pink MOST form) -     Chief Complaint  Patient presents with   Acute Visit    Patient complains of swelling legs after stopping her dieretic. Patient states elevation helps momentarily. Noted that urinary output is lower.      HPI: Patient is a 87 y.o. female seen today for medical management of chronic diseases.    OA, multiple sites, prn Tylenol.               HTN, blood pressure is controlled on Lisinopril,  Metoprolol. Bun/creat 32/1.27 06/10/20             Hypothyroidism, stable, on Levothyroxine 43mg qd. TSH 2.16 04/29/20             CKD Bun/creat 32/1.27 06/10/20             Gout asymptomatic, uric acid 7.5 12/16/19, treated with 30 day Allopurinol, repeated uric acid 8.2 04/29/20, dc'd HCZ, repeat uric acid 7.2 06/10/20             BLE edema, chronic, trace. Off HCTZ due to elevated uric acid.     Past Medical History:  Diagnosis Date   Breast cancer ([facility]    Cardiac pacemaker in situ 04/01/2004   Original implant 04/01/04 Dr. TDoreatha LewIndications syncope with Mobitz 2 heart block  RV lead Guidant 4470 52 [place] [id number] lead Guidant bipolar serial [id number]  Medtronic EnPulse Model number [id number] SN PNB [id number]    Carotid bruit 10/25/2010   right   Diverticulosis    H/O syncope    Hearing loss in right ear 05/26/2011   Hiatal hernia    HTN (hypertension), benign    Hyperlipidemia    [place]   Mobitz II    Osteoporosis     Past Surgical History:  Procedure Laterality Date   BREAST BIOPSY  1960   Left    BREAST LUMPECTOMY  1960   left 2017   BREAST LUMPECTOMY WITH RADIOACTIVE SEED LOCALIZATION Left 09/10/2015   Procedure: BREAST LUMPECTOMY WITH RADIOACTIVE SEED LOCALIZATION;  Surgeon: BExcell Seltzer MD;  [place]  Service: General;  Laterality: Left;   CATARACT EXTRACTION Bilateral 2002   Dr. GKaty Fitch  CATARACT EXTRACTION, BILATERAL  2001   INSERT / REPLACE / REMOVE PACEMAKER     LOM HEMOTYMPANUM THROAT & SINUS BX  1977   PACEMAKER GENERATOR CHANGE N/A 08/16/2012   Procedure: PACEMAKER GENERATOR CHANGE;  Surgeon: SDeboraha Sprang MD;  [facility]  Service: Cardiovascular;  Laterality: N/A;   PACEMAKER INSERTION  2006   Medtronic DDDR E2DRO1, SERIAL #  [id number]   surgery of ear      Allergies  Allergen Reactions   Adhesive [Tape] Itching and Rash    Please use "paper" tape   Codeine Nausea Only   Triamterene-Hctz Other (See Comments)    fatigue   Vasotec Diarrhea    Allergies as of 08/05/2020       Reactions   Adhesive [tape]  Itching, Rash   Please use "paper" tape   Codeine Nausea Only   Triamterene-hctz Other (See Comments)   fatigue   Vasotec Diarrhea        Medication List        Accurate as of August 05, 2020 11:59 PM. If you have any questions, ask your nurse or doctor.          acetaminophen 500 MG tablet Commonly known as: TYLENOL Take 500 mg by mouth every 6 (six) hours as needed.   beta carotene w/minerals tablet Take 1 tablet by mouth daily.   furosemide 20 MG tablet Commonly known as: LASIX Take 0.5 tablets (10 mg total) by mouth every other day. Started by: Fong Mccarry X Lachlan Mckim, NP   levothyroxine 50 MCG tablet Commonly known as: SYNTHROID TAKE 1 TABLET ONCE DAILY.   lisinopril 20 MG tablet Commonly known as: ZESTRIL Take 1 tablet (20 mg total) by mouth daily.   loperamide 2 MG tablet Commonly known as: IMODIUM A-D Take 2 mg by mouth daily as needed for diarrhea or loose stools.   metoprolol succinate 25 MG 24 hr  tablet Commonly known as: TOPROL-XL TAKE 1 TABLET ONCE DAILY.   potassium chloride SA 20 MEQ tablet Commonly known as: KLOR-CON Take 0.5 tablets (10 mEq total) by mouth every other day. Started by: Edlin Ford X Arita Severtson, NP   Vitamin D-3 25 MCG (1000 UT) Caps Take 1,000 Units by mouth daily.        Review of Systems:  Review of Systems  Constitutional:  Negative for fatigue, fever and unexpected weight change.  HENT:  Positive for hearing loss. Negative for congestion and voice change.        Hearing aids.   Eyes:  Negative for visual disturbance.       S/p inj, macular degeneration  Respiratory:  Negative for cough and shortness of breath.   Cardiovascular:  Positive for leg swelling.  Gastrointestinal:  Negative for abdominal pain and constipation.  Genitourinary:  Positive for frequency.       Every 2-3 hours at night  Musculoskeletal:  Positive for arthralgias and gait problem.  Skin:  Negative for color change.       Lateral mid right lower leg surgical scar  Neurological:  Negative for dizziness, speech difficulty and weakness.       Tingling numbness in hands.   Psychiatric/Behavioral:  Positive for sleep disturbance. Negative for behavioral problems. The patient is not nervous/anxious.        Due to frequent urination at night.    Health Maintenance  Topic Date Due   Zoster Vaccines- Shingrix (1 of 2) Never done   INFLUENZA VACCINE  09/27/2020   COVID-19 Vaccine (5 - Booster for Moderna series) 11/26/2020   TETANUS/TDAP  05/01/2021   PNA vac Low Risk Adult  Completed   DEXA SCAN  Addressed   HPV VACCINES  Aged Out    Physical Exam: Vitals:   08/05/20 1531  BP: (!) 158/82  Pulse: 85  Resp: 20  Temp: (!) 96.6 F (35.9 C)  SpO2: 96%  Weight: 138 lb (62.6 kg)  Height: _0  (1.6 m)   Body mass index is 24.45 kg/m. Physical Exam Vitals and nursing note reviewed.  Constitutional:      Appearance: Normal appearance.  HENT:     Head: Normocephalic and  atraumatic.     Mouth/Throat:     Mouth: Mucous membranes are moist.  Eyes:     Extraocular Movements: Extraocular  movements intact.     Conjunctiva/sclera: Conjunctivae normal.     Pupils: Pupils are equal, round, and reactive to light.     Comments: Mild ectropion R+L lower eyelids  Cardiovascular:     Rate and Rhythm: Normal rate and regular rhythm.     Heart sounds: Murmur heard.  Pulmonary:     Effort: Pulmonary effort is normal.     Breath sounds: No rales.  Abdominal:     General: Bowel sounds are normal.     Palpations: Abdomen is soft.     Tenderness: There is no abdominal tenderness.  Musculoskeletal:        General: No tenderness.     Cervical back: Normal range of motion and neck supple.     Right lower leg: Edema present.     Left lower leg: Edema present.     Comments: Ambulates with walker. Fullness left popliteal area. Edema, mostly in ankles, R+1, trace left. Good dorsalis pedis pulses. Slow getting up from sitting to standing.   Skin:    General: Skin is warm and dry.     Comments: Mild chronic venous insufficiency skin changes BLE. A golf ball sized, hard, irregular, orange peel skin appearance left breast.  Opted out workup and tx.   Neurological:     General: No focal deficit present.     Mental Status: She is alert and oriented to person, place, and time. Mental status is at baseline.     Motor: No weakness.     Coordination: Coordination normal.     Gait: Gait abnormal.  Psychiatric:        Mood and Affect: Mood normal.        Behavior: Behavior normal.        Thought Content: Thought content normal.        Judgment: Judgment normal.    Labs reviewed: Basic Metabolic Panel: Recent Labs    12/16/19 0700 04/29/20 0730 06/10/20 0835  NA 140 141 144  K 4.2 3.9 4.4  CL 101 105 107  CO2 _0 GLUCOSE 97 89 85  BUN 35* 42* 32*  CREATININE 1.58* 1.46* 1.27*  CALCIUM 10.0 8.9 9.0  TSH  --  2.16  --    Liver Function Tests: Recent Labs     12/16/19 0700 04/29/20 0730 06/10/20 0835  AST _1 ALT _2 BILITOT 0.7 0.7 0.6  PROT 6.5 5.8* 6.0*   No results for input(s): LIPASE, AMYLASE in the last 8760 hours. No results for input(s): AMMONIA in the last 8760 hours. CBC: Recent Labs    12/16/19 0700  WBC 10.1  NEUTROABS 5,030  HGB 13.8  HCT 41.3  MCV 89.0  PLT 245   Lipid Panel: No results for input(s): CHOL, HDL, LDLCALC, TRIG, CHOLHDL, LDLDIRECT in the last 8760 hours. No results found for: HGBA1C  Procedures since last visit: Intravitreal Injection, Pharmacologic Agent - OD - Right Eye  Result Date: 07/08/2020 Time Out 07/06/2020. 1:32 PM. Confirmed correct patient, procedure, site, and patient consented. Anesthesia Topical anesthesia was used. Anesthetic medications included Akten 3.5%. Procedure Preparation included 10% betadine to eyelids, 5% betadine to ocular surface, Ofloxacin . A 30 gauge needle was used. Injection: 2.5 mg Bevacizumab (AVASTIN) 2.2m/0.1mL SOSY   NDC:: 84665-993-57 Lot:: [id number]  Route: Intravitreal, Site: Right Eye Post-op Post injection exam found visual acuity of at least counting fingers. The patient tolerated the procedure well. There were no complications. Post injection  medications were not given.    Assessment/Plan  Edema of both lower extremities due to peripheral venous insufficiency chronic, trace. Denied SOB, cough, sputum production, chest pain/plapitation. Off HCTZ due to elevated uric acid.   Gout asymptomatic, uric acid 7.5 12/16/19, treated with 30 day Allopurinol, repeated uric acid 8.2 04/29/20, dc'd HCZ, repeat uric acid 7.2 06/10/20  CKD (chronic kidney disease) stage 3, GFR 30-59 ml/min (HCC) Bun/creat 32/1.27 06/10/20  Hypothyroidism stable, on Levothyroxine 59mg qd. TSH 2.16 04/29/20  Essential hypertension blood pressure is controlled on Lisinopril,  Metoprolol. 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    Labs/tests ordered:  CMP/eGFR prior to the appointment   Next appt:  4-5 weeks

## 2020-08-05 NOTE — Assessment & Plan Note (Signed)
Bun/creat 32/1.27 06/10/20

## 2020-08-06 ENCOUNTER — Encounter: Payer: Self-pay | Admitting: Nurse Practitioner

## 2020-08-23 ENCOUNTER — Encounter: Payer: Self-pay | Admitting: Nurse Practitioner

## 2020-08-23 ENCOUNTER — Non-Acute Institutional Stay: Payer: Medicare Other | Admitting: Nurse Practitioner

## 2020-08-23 DIAGNOSIS — M1A9XX1 Chronic gout, unspecified, with tophus (tophi): Secondary | ICD-10-CM | POA: Diagnosis not present

## 2020-08-23 DIAGNOSIS — E039 Hypothyroidism, unspecified: Secondary | ICD-10-CM

## 2020-08-23 DIAGNOSIS — N1832 Chronic kidney disease, stage 3b: Secondary | ICD-10-CM

## 2020-08-23 DIAGNOSIS — W19XXXA Unspecified fall, initial encounter: Secondary | ICD-10-CM

## 2020-08-23 DIAGNOSIS — M159 Polyosteoarthritis, unspecified: Secondary | ICD-10-CM | POA: Diagnosis not present

## 2020-08-23 DIAGNOSIS — R269 Unspecified abnormalities of gait and mobility: Secondary | ICD-10-CM

## 2020-08-23 DIAGNOSIS — I10 Essential (primary) hypertension: Secondary | ICD-10-CM

## 2020-08-23 DIAGNOSIS — I872 Venous insufficiency (chronic) (peripheral): Secondary | ICD-10-CM

## 2020-08-23 NOTE — Assessment & Plan Note (Signed)
OA, multiple sites, prn Tylenol.

## 2020-08-23 NOTE — Assessment & Plan Note (Signed)
Bun/creat 32/1.27 06/10/20

## 2020-08-23 NOTE — Progress Notes (Signed)
Location:   [place] Room Number: [id number] Place of Service:  ALF [phone]) Provider:  Tommy Goostree X Gwenna Fuston, NP  Kennett Symes X, NP  Patient Care Team: Nikkie Liming X, NP as PCP - General (Internal Medicine) Debara Pickett Nadean Corwin, MD as PCP - Cardiology (Cardiology)  Extended Emergency Contact Information Primary Emergency Contact: Anastasia Fiedler, [place] Phone: [phone] Relation: Son Secondary Emergency Contact: [place] Phone: [phone] Relation: Friend  Code Status:  DNR Goals of care: Advanced Directive information Advanced Directives 08/23/2020  Does Patient Have a Medical Advance Directive? No  Type of Advance Directive -  Does patient want to make changes to medical advance directive? No - Patient declined  Copy of [place] in Chart? -  Pre-existing out of facility DNR order (yellow form or pink MOST form) -     Chief Complaint  Patient presents with   Acute Visit    Patient presents after a fall.    HPI:  Pt is a 87 y.o. female seen today for an acute visit for reported the patient's fall 08/22/20 when the patient was found lying on her side on the floor in hallway near the main dinning room. The patient stted she ambulated with her walker to th e library to read, on her way back her legs gave away and fell on her side. No apparent injury. Denied dizziness, headache, chest pain/pressure, or focal weakness associated with the fall.   OA, multiple sites, prn Tylenol.               HTN, blood pressure is controlled on Lisinopril,  Metoprolol. Bun/creat 32/1.27 06/10/20             Hypothyroidism, stable, on Levothyroxine 37mcg qd. TSH 2.16 04/29/20             CKD Bun/creat 32/1.27 06/10/20             Gout asymptomatic, uric acid 7.5 12/16/19, treated with 30 day Allopurinol, repeated uric acid 8.2 04/29/20, dc'd HCZ, repeat uric acid 7.2 06/10/20             BLE edema, chronic, trace, takes  Furosemide 10mg  qod.   Gait abnormality, walker all the times, may need w/c to go further.     Past Medical History:  Diagnosis Date   Breast cancer [facility])    Cardiac pacemaker in situ 04/01/2004   Original implant 04/01/04 Dr. Doreatha Lew Indications syncope with Mobitz 2 heart block  RV lead Guidant 4470 52 cm bipolar lead, SN [id number] atrial lead Guidant bipolar serial #[id number].  Medtronic EnPulse Model number [id number], SN PNB [id number]    Carotid bruit 10/25/2010   right   Diverticulosis    H/O syncope    Hearing loss in right ear 05/26/2011   Hiatal hernia    HTN (hypertension), benign    Hyperlipidemia    Melanoma of back ([place]) 2009   Mobitz II    Osteoporosis    Past Surgical History:  Procedure Laterality Date   BREAST BIOPSY  1960   Left   BREAST LUMPECTOMY  1960   left 2017   BREAST LUMPECTOMY WITH RADIOACTIVE SEED LOCALIZATION Left 09/10/2015   Procedure: BREAST LUMPECTOMY WITH RADIOACTIVE SEED LOCALIZATION;  Surgeon: Excell Seltzer, MD;  Location: [place];  Service: General;  Laterality: Left;   CATARACT EXTRACTION Bilateral 2002   Dr. Katy Fitch   CATARACT EXTRACTION, BILATERAL  2001   INSERT / REPLACE / REMOVE PACEMAKER     LOM HEMOTYMPANUM THROAT & SINUS BX  1977   PACEMAKER GENERATOR CHANGE N/A 08/16/2012   Procedure: PACEMAKER GENERATOR CHANGE;  Surgeon: Deboraha Sprang, MD;  Location: [facility] CATH LAB;  Service: Cardiovascular;  Laterality: N/A;   PACEMAKER INSERTION  2006   Medtronic DDDR E2DRO1, SERIAL #  [id number]   surgery of ear      Allergies  Allergen Reactions   Adhesive [Tape] Itching and Rash    Please use "paper" tape   Codeine Nausea Only   Triamterene-Hctz Other (See Comments)    fatigue   Vasotec Diarrhea    Allergies as of 08/23/2020       Reactions   Adhesive [tape] Itching, Rash   Please use "paper" tape   Codeine Nausea Only   Triamterene-hctz Other (See Comments)   fatigue   Vasotec Diarrhea        Medication List        Accurate as  of August 23, 2020  3:30 PM. If you have any questions, ask your nurse or doctor.          acetaminophen 500 MG tablet Commonly known as: TYLENOL Take 500 mg by mouth every 6 (six) hours as needed.   beta carotene w/minerals tablet Take 1 tablet by mouth daily.   furosemide 20 MG tablet Commonly known as: LASIX Take 0.5 tablets (10 mg total) by mouth every other day.   levothyroxine 50 MCG tablet Commonly known as: SYNTHROID TAKE 1 TABLET ONCE DAILY.   lisinopril 20 MG tablet Commonly known as: ZESTRIL Take 1 tablet (20 mg total) by mouth daily.   loperamide 2 MG tablet Commonly known as: IMODIUM A-D Take 2 mg by mouth daily as needed for diarrhea or loose stools.   metoprolol succinate 25 MG 24 hr tablet Commonly known as: TOPROL-XL TAKE 1 TABLET ONCE DAILY.   potassium chloride SA 20 MEQ tablet Commonly known as: KLOR-CON Take 0.5 tablets (10 mEq total) by mouth every other day.   Vitamin D-3 25 MCG (1000 UT) Caps Take 1,000 Units by mouth daily.        Review of Systems  Constitutional:  Negative for activity change, appetite change, fatigue, fever and unexpected weight change.  HENT:  Positive for hearing loss. Negative for congestion and voice change.        Hearing aids.   Eyes:  Negative for visual disturbance.       S/p inj, macular degeneration  Respiratory:  Negative for cough and shortness of breath.   Cardiovascular:  Positive for leg swelling.  Gastrointestinal:  Negative for abdominal pain and constipation.  Genitourinary:  Positive for frequency.       Every 2-3 hours at night  Musculoskeletal:  Positive for arthralgias and gait problem.  Skin:  Negative for color change.       Lateral mid right lower leg surgical scar  Neurological:  Negative for dizziness, speech difficulty and weakness.       Tingling numbness in hands.   Psychiatric/Behavioral:  Positive for sleep disturbance. Negative for behavioral problems. The patient is not  nervous/anxious.        Due to frequent urination at night.    Immunization History  Administered Date(s) Administered   Fluad Quad(high Dose 65+) 12/10/2018   Influenza Split 12/10/2012   Influenza, High Dose Seasonal PF 12/10/2019   Influenza-Unspecified 12/11/2013, 11/28/2014, 12/08/2015, 12/04/2016, 11/27/2017, 12/10/2019   Moderna SARS-COV2 Booster  Vaccination 01/06/2020   Moderna Sars-Covid-2 Vaccination 03/03/2019, 03/31/2019   PFIZER(Purple Top)SARS-COV-2 Vaccination 07/27/2020   Pneumococcal Conjugate-13 01/06/2014, 01/08/2014   Pneumococcal Polysaccharide-23 04/20/2009   Tdap 07/03/2000, 05/02/2011   Zoster, Live 10/20/2009   Pertinent  Health Maintenance Due  Topic Date Due   INFLUENZA VACCINE  09/27/2020   PNA vac Low Risk Adult  Completed   DEXA SCAN  Addressed   Fall Risk  08/05/2020 06/17/2020 05/20/2020 01/30/2020 12/11/2019  Falls in the past year? 0 0 0 1 1  Number falls in past yr: 0 0 0 0 0  Injury with Fall? 0 0 - 1 0  Risk for fall due to : - - - - -   Functional Status Survey:    Vitals:   08/23/20 1418  BP: 136/80  Pulse: 78  Resp: 18  Temp: (!) 97.2 F (36.2 C)  SpO2: 96%  Weight: 136 lb 3.2 oz (61.8 kg)  Height: 5\' 3"  (1.6 m)   Body mass index is 24.13 kg/m. Physical Exam  Labs reviewed: Recent Labs    12/16/19 0700 04/29/20 0730 06/10/20 0835  NA 140 141 144  K 4.2 3.9 4.4  CL 101 105 107  CO2 29 27 23   GLUCOSE 97 89 85  BUN 35* 42* 32*  CREATININE 1.58* 1.46* 1.27*  CALCIUM 10.0 8.9 9.0   Recent Labs    12/16/19 0700 04/29/20 0730 06/10/20 0835  AST 16 11 13   ALT 11 8 12   BILITOT 0.7 0.7 0.6  PROT 6.5 5.8* 6.0*   Recent Labs    12/16/19 0700  WBC 10.1  NEUTROABS 5,030  HGB 13.8  HCT 41.3  MCV 89.0  PLT 245   Lab Results  Component Value Date   TSH 2.16 04/29/2020   No results found for: HGBA1C Lab Results  Component Value Date   CHOL 143 07/22/2019   HDL 62 07/22/2019   LDLCALC 64 07/22/2019   TRIG 91  07/22/2019   CHOLHDL 2.3 07/22/2019    Significant Diagnostic Results in last 30 days:  No results found.  Assessment/Plan Fall reported the patient's fall 08/22/20 when the patient was found lying on her side on the floor in hallway near the main dinning room. The patient stted she ambulated with her walker to th e library to read, on her way back her legs gave away and fell on her side. No apparent injury. Denied dizziness, headache, chest pain/pressure, or focal weakness associated with the fall. Therapy to evaluate/tx for gait, strengthening, balance.   Generalized osteoarthritis of multiple sites OA, multiple sites, prn Tylenol.   Essential hypertension blood pressure is controlled on Lisinopril,  Metoprolol. Bun/creat 32/1.27 06/10/20  Hypothyroidism stable, on Levothyroxine 74mcg qd. TSH 2.16 04/29/20  CKD (chronic kidney disease) stage 3, GFR 30-59 ml/min (HCC) Bun/creat 32/1.27 06/10/20  Gout Bun/creat 32/1.27 06/10/20  Edema of both lower extremities due to peripheral venous insufficiency BLE edema, chronic, trace, takes Furosemide 10mg  qod.   Gait abnormality Gait abnormality, walker all the times, may need w/c to go further.     Family/ staff Communication: plan of care reviewed with the patient and charge nurse.   Labs/tests ordered:  none  Time spend 40 minutes.

## 2020-08-23 NOTE — Assessment & Plan Note (Signed)
blood pressure is controlled on Lisinopril,  Metoprolol. Bun/creat 32/1.27 06/10/20

## 2020-08-23 NOTE — Assessment & Plan Note (Signed)
Gait abnormality, walker all the times, may need w/c to go further.

## 2020-08-23 NOTE — Assessment & Plan Note (Signed)
reported the patient's fall 08/22/20 when the patient was found lying on her side on the floor in hallway near the main dinning room. The patient stted she ambulated with her walker to th e library to read, on her way back her legs gave away and fell on her side. No apparent injury. Denied dizziness, headache, chest pain/pressure, or focal weakness associated with the fall. Therapy to evaluate/tx for gait, strengthening, balance.

## 2020-08-23 NOTE — Assessment & Plan Note (Signed)
BLE edema, chronic, trace, takes Furosemide 10mg  qod.

## 2020-08-23 NOTE — Assessment & Plan Note (Signed)
stable, on Levothyroxine 70mcg qd. TSH 2.16 04/29/20

## 2020-08-31 ENCOUNTER — Other Ambulatory Visit: Payer: Self-pay

## 2020-08-31 ENCOUNTER — Encounter (INDEPENDENT_AMBULATORY_CARE_PROVIDER_SITE_OTHER): Payer: Self-pay | Admitting: Ophthalmology

## 2020-08-31 ENCOUNTER — Ambulatory Visit (INDEPENDENT_AMBULATORY_CARE_PROVIDER_SITE_OTHER): Payer: Medicare Other | Admitting: Ophthalmology

## 2020-08-31 DIAGNOSIS — H353211 Exudative age-related macular degeneration, right eye, with active choroidal neovascularization: Secondary | ICD-10-CM | POA: Diagnosis not present

## 2020-08-31 DIAGNOSIS — H353222 Exudative age-related macular degeneration, left eye, with inactive choroidal neovascularization: Secondary | ICD-10-CM

## 2020-08-31 MED ORDER — AFLIBERCEPT 2MG/0.05ML IZ SOLN FOR KALEIDOSCOPE
2.0000 mg | INTRAVITREAL | Status: AC | PRN
  Administered 2020-08-31: 2 mg via INTRAVITREAL

## 2020-08-31 NOTE — Assessment & Plan Note (Signed)
Chronic active and recurrent CNVM OD in the past now stabilized on intravitreal Eylea at 8-week interval.  Preserved acuity in fact improved today.

## 2020-08-31 NOTE — Progress Notes (Signed)
08/31/2020     CHIEF COMPLAINT Patient presents for Retina Follow Up (8 week fu OD and Eylea OD/Pt states, " I feel like my va is not as clear as it was. I am not able to see the captions on the TV as well as I could."//)   HISTORY OF PRESENT ILLNESS: [name] [name] is a 87 y.o. female who presents to the clinic today for:   HPI     Retina Follow Up           Diagnosis: Wet AMD   Laterality: right eye   Onset: 8 weeks ago   Severity: mild   Duration: 8 weeks   Course: gradually worsening   Comments: 8 week fu OD and Eylea OD Pt states, " I feel like my va is not as clear as it was. I am not able to see the captions on the TV as well as I could."         Last edited by Kendra Opitz, COA on 08/31/2020 10:06 AM.      Referring physician: Mast, Man X, NP [address],  [place] [address]  HISTORICAL INFORMATION:   Selected notes from the MEDICAL RECORD NUMBER       CURRENT MEDICATIONS: No current outpatient medications on file. (Ophthalmic Drugs)   No current facility-administered medications for this visit. (Ophthalmic Drugs)   Current Outpatient Medications (Other)  Medication Sig   acetaminophen (TYLENOL) 500 MG tablet Take 500 mg by mouth every 6 (six) hours as needed.   beta carotene w/minerals (OCUVITE) tablet Take 1 tablet by mouth daily.   Cholecalciferol (VITAMIN D-3) 1000 units CAPS Take 1,000 Units by mouth daily.   furosemide (LASIX) 20 MG tablet Take 0.5 tablets (10 mg total) by mouth every other day.   levothyroxine (SYNTHROID) 50 MCG tablet TAKE 1 TABLET ONCE DAILY.   lisinopril (ZESTRIL) 20 MG tablet Take 1 tablet (20 mg total) by mouth daily.   loperamide (IMODIUM A-D) 2 MG tablet Take 2 mg by mouth daily as needed for diarrhea or loose stools.   metoprolol succinate (TOPROL-XL) 25 MG 24 hr tablet TAKE 1 TABLET ONCE DAILY.   potassium chloride SA (KLOR-CON) 20 MEQ tablet Take 0.5 tablets (10 mEq total) by mouth every other day.   No  current facility-administered medications for this visit. (Other)      REVIEW OF SYSTEMS:    ALLERGIES Allergies  Allergen Reactions   Adhesive [Tape] Itching and Rash    Please use "paper" tape   Codeine Nausea Only   Triamterene-Hctz Other (See Comments)    fatigue   Vasotec Diarrhea    PAST MEDICAL HISTORY Past Medical History:  Diagnosis Date   Breast cancer ([place])    Cardiac pacemaker in situ 04/01/2004   Original implant 04/01/04 Dr. Doreatha Lew Indications syncope with Mobitz 2 heart block  RV lead Guidant 4470 52 cm bipolar lead, SN [id number] atrial lead Guidant bipolar serial #[id number].  Medtronic EnPulse Model number [id number], SN PNB [id number]    Carotid bruit 10/25/2010   right   Diverticulosis    H/O syncope    Hearing loss in right ear 05/26/2011   Hiatal hernia    HTN (hypertension), benign    Hyperlipidemia    Melanoma of back ([place]) 2009   Mobitz II    Osteoporosis    Past Surgical History:  Procedure Laterality Date   BREAST BIOPSY  1960   Left   BREAST LUMPECTOMY  1960   left 2017   BREAST LUMPECTOMY WITH RADIOACTIVE SEED LOCALIZATION Left 09/10/2015   Procedure: BREAST LUMPECTOMY WITH RADIOACTIVE SEED LOCALIZATION;  Surgeon: Excell Seltzer, MD;  Location: [facility];  Service: General;  Laterality: Left;   CATARACT EXTRACTION Bilateral 2002   Dr. Katy Fitch   CATARACT EXTRACTION, BILATERAL  2001   INSERT / REPLACE / REMOVE PACEMAKER     LOM HEMOTYMPANUM THROAT & SINUS BX  1977   PACEMAKER GENERATOR CHANGE N/A 08/16/2012   Procedure: PACEMAKER GENERATOR CHANGE;  Surgeon: Deboraha Sprang, MD;  [facility] C[facility] LAB;  Service: Cardiovascular;  Laterality: N/A;   PACEMAKER INSERTION  2006   Medtronic DDDR E2DRO1, SERIAL #  [id number] H   surgery of ear      FAMILY HISTORY Family History  Problem Relation Age of Onset   Stroke Mother 94   Kidney disease Father 17   Cirrhosis Son     SOCIAL HISTORY Social History   Tobacco Use   Smoking status: Former     Packs/day: 0.33    Years: 10.00    Pack years: 3.30    Types: Cigarettes    Quit date: 02/27/1946    Years since quitting: 74.5   Smokeless tobacco: Never  Vaping Use   Vaping Use: Never used  Substance Use Topics   Alcohol use: Yes    Alcohol/week: 7.0 standard drinks    Types: 7 Glasses of wine per week    Comment: wine   Drug use: No         OPHTHALMIC EXAM:  Base Eye Exam     Visual Acuity (ETDRS)       Right Left   Dist cc 20/30 -2 CF at face   Dist ph cc NI          Tonometry (Tonopen, 10:11 AM)       Right Left   Pressure 14 15         Pupils       Pupils Dark Light Shape React APD   Right PERRL 5 4 Round Sluggish None   Left PERRL 5 4 Round Sluggish None         Visual Fields       Left Right   Restrictions Partial outer superior temporal, superior nasal deficiencies          Neuro/Psych     Oriented x3: Yes   Mood/Affect: Normal           Slit Lamp and Fundus Exam     External Exam       Right Left   External Normal Normal         Slit Lamp Exam       Right Left   Lids/Lashes Normal Normal   Conjunctiva/Sclera White and quiet White and quiet   Cornea Clear Clear   Anterior Chamber Deep and quiet Deep and quiet   Iris Round and reactive Round and reactive   Lens Open posterior capsule Posterior chamber intraocular lens   Anterior Vitreous Normal Normal         Fundus Exam       Right Left   Posterior Vitreous Posterior vitreous detachment    Disc Normal    C/D Ratio 0.5    Macula Atrophy, Retinal pigment epithelial atrophy, Age related macular degeneration, Early age related macular degeneration, Drusen, no exudates, no hemorrhage, no macular thickening, Geographic atrophy 3 DA size not in the FAZ, reddish hue temporal  Vessels Normal    Periphery Normal, no holes or tears, 28D and 20 D examination to the ora serrata             IMAGING AND PROCEDURES  Imaging and Procedures for 08/31/20  OCT,  Retina - OU - Both Eyes       Right Eye Quality was good. Scan locations included subfoveal. Central Foveal Thickness: 303. Progression has been stable. Findings include abnormal foveal contour, retinal drusen , subretinal scarring, subretinal hyper-reflective material, no IRF.   Left Eye Quality was good. Scan locations included subfoveal. Central Foveal Thickness: 306. Progression has been stable. Findings include abnormal foveal contour, central retinal atrophy, outer retinal atrophy.   Notes Visual acuity preserved OD with less subretinal fluid today at 8-week follow-up.  We will repeat injection today and maintain 8 to 9-week interval follow-up exams      Intravitreal Injection, Pharmacologic Agent - OD - Right Eye       Time Out 08/31/2020. 10:31 AM. Confirmed correct patient, procedure, site, and patient consented.   Anesthesia Topical anesthesia was used. Anesthetic medications included Akten 3.5%.   Procedure Preparation included 10% betadine to eyelids, 5% betadine to ocular surface, Ofloxacin . A 30 gauge needle was used.   Injection: 2 mg aflibercept 2 MG/0.05ML   Route: Intravitreal, Site: Right Eye   NDC: [id number], Lot: [id number], Waste: 0 mL   Post-op Post injection exam found visual acuity of at least counting fingers. The patient tolerated the procedure well. There were no complications. Post injection medications were not given.              ASSESSMENT/PLAN:  Exudative age-related macular degeneration of right eye with active choroidal neovascularization (HCC) Chronic active and recurrent CNVM OD in the past now stabilized on intravitreal Eylea at 8-week interval.  Preserved acuity in fact improved today.  Exudative age-related macular degeneration of left eye with inactive choroidal neovascularization (HCC) Prior disciform scar left eye, and active no changes     ICD-10-CM   1. Exudative age-related macular degeneration of right eye with active  choroidal neovascularization (HCC)  H35.3211 OCT, Retina - OU - Both Eyes    Intravitreal Injection, Pharmacologic Agent - OD - Right Eye    aflibercept (EYLEA) SOLN 2 mg    2. Exudative age-related macular degeneration of left eye with inactive choroidal neovascularization ([place])  H35.3222       1.  OD overall stable with improved and stabilize acuity.  Slight change in OCT thickness from the computers wandering baseline measurements not clinically applicable.   2.  Still remained stable at 8-week follow-up with history of multiple recurrences of CNVM OD  3.  Signs of active disease OS  Ophthalmic Meds Ordered this visit:  Meds ordered this encounter  Medications   aflibercept (EYLEA) SOLN 2 mg       Return in about 8 weeks (around 10/26/2020) for dilate, OD, EYLEA OCT.  There are no Patient Instructions on file for this visit.   Explained the diagnoses, plan, and follow up with the patient and they expressed understanding.  Patient expressed understanding of the importance of proper follow up care.   Clent Demark Abir Craine M.D. Diseases & Surgery of the Retina and Vitreous [facility]ina [facility] 08/31/20     Abbreviations: M myopia (nearsighted); A astigmatism; H hyperopia (farsighted); P presbyopia; Mrx spectacle prescription;  CTL contact lenses; OD right eye; OS left eye; OU both eyes  XT exotropia; ET esotropia; PEK  punctate epithelial keratitis; PEE punctate epithelial erosions; DES dry eye syndrome; MGD meibomian gland dysfunction; ATs artificial tears; PFAT's preservative free artificial tears; [place] nuclear sclerotic cataract; PSC posterior subcapsular cataract; ERM epi-retinal membrane; PVD posterior vitreous detachment; RD retinal detachment; DM diabetes mellitus; DR diabetic retinopathy; NPDR non-proliferative diabetic retinopathy; PDR proliferative diabetic retinopathy; CSME clinically significant macular edema; DME diabetic macular edema; dbh dot blot hemorrhages; CWS  cotton wool spot; POAG primary open angle glaucoma; C/D cup-to-disc ratio; HVF humphrey visual field; GVF goldmann visual field; OCT optical coherence tomography; IOP intraocular pressure; BRVO Branch retinal vein occlusion; CRVO central retinal vein occlusion; CRAO central retinal artery occlusion; BRAO branch retinal artery occlusion; RT retinal tear; SB scleral buckle; PPV pars plana vitrectomy; VH Vitreous hemorrhage; PRP panretinal laser photocoagulation; IVK intravitreal kenalog; VMT vitreomacular traction; MH Macular hole;  NVD neovascularization of the disc; NVE neovascularization elsewhere; AREDS age related eye disease study; ARMD age related macular degeneration; POAG primary open angle glaucoma; EBMD epithelial/anterior basement membrane dystrophy; ACIOL anterior chamber intraocular lens; IOL intraocular lens; PCIOL posterior chamber intraocular lens; Phaco/IOL phacoemulsification with intraocular lens placement; [place] photorefractive keratectomy; LASIK laser assisted in situ keratomileusis; HTN hypertension; DM diabetes mellitus; COPD chronic obstructive pulmonary disease

## 2020-08-31 NOTE — Assessment & Plan Note (Signed)
Prior disciform scar left eye, and active no changes

## 2020-09-06 DIAGNOSIS — I872 Venous insufficiency (chronic) (peripheral): Secondary | ICD-10-CM | POA: Diagnosis not present

## 2020-09-07 ENCOUNTER — Other Ambulatory Visit: Payer: Self-pay

## 2020-09-07 ENCOUNTER — Other Ambulatory Visit: Payer: Medicare Other

## 2020-09-07 DIAGNOSIS — I872 Venous insufficiency (chronic) (peripheral): Secondary | ICD-10-CM

## 2020-09-07 LAB — COMPLETE METABOLIC PANEL WITH GFR
AG Ratio: 1.6 (calc) (ref 1.0–2.5)
ALT: 13 U/L (ref 6–29)
AST: 14 U/L (ref 10–35)
Albumin: 3.7 g/dL (ref 3.6–5.1)
Alkaline phosphatase (APISO): 86 U/L (ref 37–153)
BUN/Creatinine Ratio: 28 (calc) — ABNORMAL HIGH (ref 6–22)
BUN: 34 mg/dL — ABNORMAL HIGH (ref 7–25)
CO2: 27 mmol/L (ref 20–32)
Calcium: 9 mg/dL (ref 8.6–10.4)
Chloride: 105 mmol/L (ref 98–110)
Creat: 1.23 mg/dL — ABNORMAL HIGH (ref 0.60–0.95)
Globulin: 2.3 g/dL (calc) (ref 1.9–3.7)
Glucose, Bld: 77 mg/dL (ref 65–99)
Potassium: 4.1 mmol/L (ref 3.5–5.3)
Sodium: 141 mmol/L (ref 135–146)
Total Bilirubin: 0.5 mg/dL (ref 0.2–1.2)
Total Protein: 6 g/dL — ABNORMAL LOW (ref 6.1–8.1)
eGFR: 39 mL/min/{1.73_m2} — ABNORMAL LOW (ref >=60)

## 2020-09-08 ENCOUNTER — Ambulatory Visit (INDEPENDENT_AMBULATORY_CARE_PROVIDER_SITE_OTHER): Payer: Medicare Other

## 2020-09-08 DIAGNOSIS — I442 Atrioventricular block, complete: Secondary | ICD-10-CM | POA: Diagnosis not present

## 2020-09-09 ENCOUNTER — Other Ambulatory Visit: Payer: Self-pay

## 2020-09-09 ENCOUNTER — Encounter: Payer: Medicare Other | Admitting: Nurse Practitioner

## 2020-09-09 LAB — CUP PACEART REMOTE DEVICE CHECK
Battery Impedance: 2177 Ohm
Battery Remaining Longevity: 26 mo
Battery Voltage: 2.74 V
Brady Statistic AP VP Percent: 19 %
Brady Statistic AP VS Percent: 0 %
Brady Statistic AS VP Percent: 81 %
Brady Statistic AS VS Percent: 0 %
Date Time Interrogation Session: 20220713174914
Implantable Lead Implant Date: 20060203
Implantable Lead Implant Date: 20060203
Implantable Lead Location: 753859
Implantable Lead Location: 753860
Implantable Lead Model: 4469
Implantable Lead Model: 4470
Implantable Lead Serial Number: 458590
Implantable Lead Serial Number: 500613
Implantable Pulse Generator Implant Date: 20140620
Lead Channel Impedance Value: 425 Ohm
Lead Channel Impedance Value: 514 Ohm
Lead Channel Pacing Threshold Amplitude: 0.625 V
Lead Channel Pacing Threshold Amplitude: 1.25 V
Lead Channel Pacing Threshold Pulse Width: 0.4 ms
Lead Channel Pacing Threshold Pulse Width: 0.4 ms
Lead Channel Setting Pacing Amplitude: 2.5 V
Lead Channel Setting Pacing Amplitude: 2.5 V
Lead Channel Setting Pacing Pulse Width: 0.4 ms
Lead Channel Setting Sensing Sensitivity: 2.8 mV

## 2020-10-01 NOTE — Progress Notes (Signed)
Remote pacemaker transmission.   

## 2020-10-19 ENCOUNTER — Non-Acute Institutional Stay: Payer: Medicare Other | Admitting: Internal Medicine

## 2020-10-19 ENCOUNTER — Encounter: Payer: Self-pay | Admitting: Internal Medicine

## 2020-10-19 DIAGNOSIS — I480 Paroxysmal atrial fibrillation: Secondary | ICD-10-CM

## 2020-10-19 DIAGNOSIS — I10 Essential (primary) hypertension: Secondary | ICD-10-CM | POA: Diagnosis not present

## 2020-10-19 DIAGNOSIS — N1832 Chronic kidney disease, stage 3b: Secondary | ICD-10-CM

## 2020-10-19 DIAGNOSIS — M159 Polyosteoarthritis, unspecified: Secondary | ICD-10-CM | POA: Diagnosis not present

## 2020-10-19 DIAGNOSIS — I872 Venous insufficiency (chronic) (peripheral): Secondary | ICD-10-CM

## 2020-10-19 DIAGNOSIS — E039 Hypothyroidism, unspecified: Secondary | ICD-10-CM | POA: Diagnosis not present

## 2020-10-19 DIAGNOSIS — R269 Unspecified abnormalities of gait and mobility: Secondary | ICD-10-CM

## 2020-10-19 NOTE — Progress Notes (Signed)
Location:  [facility] Room Number: 854 Place of Service:  [phone])  Provider: Veleta Miners MD   Code Status: DNR Goals of Care:  Advanced Directives 10/19/2020  Does Patient Have a Medical Advance Directive? Yes  Type of Paramedic of [place];Living will;Out of facility DNR (pink MOST or yellow form)  Does patient want to make changes to medical advance directive? No - Patient declined  Copy of [place] in Chart? Yes - validated most recent copy scanned in chart (See row information)  Pre-existing out of facility DNR order (yellow form or pink MOST form) Yellow form placed in chart (order not valid for inpatient use);Pink MOST form placed in chart (order not valid for inpatient use)     Chief Complaint  Patient presents with   Medical Management of Chronic Issues   Quality Metric Gaps    Shingrix, flu shot    HPI: Patient is a 87 y.o. female seen today for medical management of chronic diseases.    Patient has h/o HTN, Stage 4 CKD, Hypothyroidism, PAF< h/o Second degree Block s/p PPM, HLD, LE edema  Patent getting admitted to [facility] for care as she was unable to manage her apartment Did not have any acute complains Walks with her walker No Fall since 6/22 Working with therapy Appetite is good. Pain is controlled with Tylenol   Past Medical History:  Diagnosis Date   Breast cancer [facility])    Cardiac pacemaker in situ 04/01/2004   Original implant 04/01/04 Dr. Doreatha Lew Indications syncope with Mobitz 2 heart block  RV lead Guidant 4470 52 cm bipolar lead, SN [id number] atrial lead Guidant bipolar serial #[id number].  Medtronic EnPulse Model number [id number], SN PNB [id number]    Carotid bruit 10/25/2010   right   Diverticulosis    H/O syncope    Hearing loss in right ear 05/26/2011   Hiatal hernia    HTN (hypertension), benign    Hyperlipidemia    Melanoma of back ([place]) 2009   Mobitz II    Osteoporosis      Past Surgical History:  Procedure Laterality Date   BREAST BIOPSY  1960   Left   BREAST LUMPECTOMY  1960   left 2017   BREAST LUMPECTOMY WITH RADIOACTIVE SEED LOCALIZATION Left 09/10/2015   Procedure: BREAST LUMPECTOMY WITH RADIOACTIVE SEED LOCALIZATION;  Surgeon: Excell Seltzer, MD;  Location: [facility];  Service: General;  Laterality: Left;   CATARACT EXTRACTION Bilateral 2002   Dr. Katy Fitch   CATARACT EXTRACTION, BILATERAL  2001   INSERT / REPLACE / REMOVE PACEMAKER     LOM HEMOTYMPANUM THROAT & SINUS BX  1977   PACEMAKER GENERATOR CHANGE N/A 08/16/2012   Procedure: PACEMAKER GENERATOR CHANGE;  Surgeon: Deboraha Sprang, MD;  Location[facility] CATH LAB;  Service: Cardiovascular;  Laterality: N/A;   PACEMAKER INSERTION  2006   Medtronic DDDR E2DRO1, SERIAL #  [id number] H   surgery of ear      Allergies  Allergen Reactions   Adhesive [Tape] Itching and Rash    Please use "paper" tape   Codeine Nausea Only   Triamterene-Hctz Other (See Comments)    fatigue   Vasotec Diarrhea    Outpatient Encounter Medications as of 10/19/2020  Medication Sig   acetaminophen (TYLENOL) 500 MG tablet Take 500 mg by mouth every 6 (six) hours as needed.   beta carotene w/minerals (OCUVITE) tablet Take 1 tablet by mouth daily.   Cholecalciferol (VITAMIN D-3) 1000  units CAPS Take 1,000 Units by mouth daily.   furosemide (LASIX) 20 MG tablet Take 0.5 tablets (10 mg total) by mouth every other day.   levothyroxine (SYNTHROID) 50 MCG tablet TAKE 1 TABLET ONCE DAILY.   lisinopril (ZESTRIL) 20 MG tablet Take 1 tablet (20 mg total) by mouth daily.   loperamide (IMODIUM A-D) 2 MG tablet Take 2 mg by mouth daily as needed for diarrhea or loose stools.   metoprolol succinate (TOPROL-XL) 25 MG 24 hr tablet TAKE 1 TABLET ONCE DAILY.   potassium chloride SA (KLOR-CON) 20 MEQ tablet Take 0.5 tablets (10 mEq total) by mouth every other day.   No facility-administered encounter medications on file as of 10/19/2020.     Review of Systems:  Review of Systems Review of Systems  Constitutional: Negative for activity change, appetite change, chills, diaphoresis, fatigue and fever.  HENT: Negative for mouth sores, postnasal drip, rhinorrhea, sinus pain and sore throat.   Respiratory: Negative for apnea, cough, chest tightness, shortness of breath and wheezing.   Cardiovascular: Negative for chest pain, palpitations and leg swelling.  Gastrointestinal: Negative for abdominal distention, abdominal pain, constipation, diarrhea, nausea and vomiting.  Genitourinary: Negative for dysuria and frequency.  Musculoskeletal: Negative for arthralgias, joint swelling and myalgias.  Skin: Negative for rash.  Neurological: Negative for dizziness, syncope, weakness, light-headedness and numbness.  Psychiatric/Behavioral: Negative for behavioral problems, confusion and sleep disturbance.    Health Maintenance  Topic Date Due   Zoster Vaccines- Shingrix (1 of 2) Never done   INFLUENZA VACCINE  09/27/2020   COVID-19 Vaccine (5 - Booster for Moderna series) 11/26/2020   TETANUS/TDAP  05/01/2021   PNA vac Low Risk Adult  Completed   DEXA SCAN  Addressed   HPV VACCINES  Aged Out    Physical Exam: Vitals:   10/19/20 1524  BP: (!) 118/53  Pulse: 64  Resp: 18  Temp: (!) 97 F (36.1 C)  SpO2: 94%  Weight: 136 lb 6.4 oz (61.9 kg)  Height: 5\' 3"  (1.6 m)   Body mass index is 24.16 kg/m. Physical Exam Constitutional: Oriented to person, place, and time. Well-developed and well-nourished.  HENT:  Head: Normocephalic.  Mouth/Throat: Oropharynx is clear and moist.  Eyes: Pupils are equal, round, and reactive to light.  Neck: Neck supple.  Cardiovascular: Normal rate and normal heart sounds.  Murmur heard. Pulmonary/Chest: Effort normal and breath sounds normal. No respiratory distress. No wheezes. She has no rales.  Abdominal: Soft. Bowel sounds are normal. No distension. There is no tenderness. There is no  rebound.  Musculoskeletal: Bilateral Mild Edema Lymphadenopathy: none Neurological: Alert and oriented to person, place, and time.  Walks with her walker. Cognitively intact Skin: Skin is warm and dry.  Psychiatric: Normal mood and affect. Behavior is normal. Thought content normal.   Labs reviewed: Basic Metabolic Panel: Recent Labs    04/29/20 0730 06/10/20 0835 09/06/20 0934  NA 141 144 141  K 3.9 4.4 4.1  CL 105 107 105  CO2 27 23 27   GLUCOSE 89 85 77  BUN 42* 32* 34*  CREATININE 1.46* 1.27* 1.23*  CALCIUM 8.9 9.0 9.0  TSH 2.16  --   --    Liver Function Tests: Recent Labs    04/29/20 0730 06/10/20 0835 09/06/20 0934  AST 11 13 14   ALT 8 12 13   BILITOT 0.7 0.6 0.5  PROT 5.8* 6.0* 6.0*   No results for input(s): LIPASE, AMYLASE in the last 8760 hours. No results for input(s):  AMMONIA in the last 8760 hours. CBC: Recent Labs    12/16/19 0700  WBC 10.1  NEUTROABS 5,030  HGB 13.8  HCT 41.3  MCV 89.0  PLT 245   Lipid Panel: No results for input(s): CHOL, HDL, LDLCALC, TRIG, CHOLHDL, LDLDIRECT in the last 8760 hours. No results found for: HGBA1C  Procedures since last visit: No results found.  Assessment/Plan Essential hypertension On Lisinopril and Toprol Also on Low dose of Lasix Hypothyroidism, unspecified type TSH normal in 3/22 Stage 3b chronic kidney disease (HCC) Creat is staying stable Generalized osteoarthritis of multiple sites Tylenol PRN Edema of both lower extremities due to peripheral venous insufficiency On Low dose of Lasix Gait abnormality Working with therapy Now in [place] with her walker PAF (paroxysmal atrial fibrillation) (HCC) A. fib burden is low and she is not on anticoagulation due to fall risk and low burden  History of second degree block S/P Pacemaker placement      Labs/tests ordered:  * No order type specified * Next appt:  11/17/2020

## 2020-10-26 ENCOUNTER — Encounter (INDEPENDENT_AMBULATORY_CARE_PROVIDER_SITE_OTHER): Payer: Self-pay | Admitting: Ophthalmology

## 2020-10-26 ENCOUNTER — Other Ambulatory Visit: Payer: Self-pay

## 2020-10-26 ENCOUNTER — Ambulatory Visit (INDEPENDENT_AMBULATORY_CARE_PROVIDER_SITE_OTHER): Payer: Medicare Other | Admitting: Ophthalmology

## 2020-10-26 DIAGNOSIS — H353222 Exudative age-related macular degeneration, left eye, with inactive choroidal neovascularization: Secondary | ICD-10-CM

## 2020-10-26 DIAGNOSIS — H353211 Exudative age-related macular degeneration, right eye, with active choroidal neovascularization: Secondary | ICD-10-CM | POA: Diagnosis not present

## 2020-10-26 MED ORDER — AFLIBERCEPT 2MG/0.05ML IZ SOLN FOR KALEIDOSCOPE
2.0000 mg | INTRAVITREAL | Status: AC | PRN
  Administered 2020-10-26: 2 mg via INTRAVITREAL

## 2020-10-26 NOTE — Assessment & Plan Note (Signed)
Stable acuity and macular findings today at 8-week follow-up.  We will repeat injection today to maintain acuity and prevent recurrence vision limited by dry atrophic change in the fovea as well as subfoveal scarring

## 2020-10-26 NOTE — Assessment & Plan Note (Signed)
Poor OCT today we will monitor next

## 2020-10-26 NOTE — Progress Notes (Signed)
10/26/2020     CHIEF COMPLAINT Patient presents for  Chief Complaint  Patient presents with   Retina Follow Up      HISTORY OF PRESENT ILLNESS: [name] [name] is a 87 y.o. female who presents to the clinic today for:   HPI     Retina Follow Up   Patient presents with  Wet AMD.  In right eye.  This started 8 weeks ago.  Severity is mild.  Duration of 8 weeks.  Since onset it is gradually worsening.        Comments   8 week fu od and Eylea OD Pt states, "I have noticed in the last month that my vision is much more blurred. I cannot watch tv and read the subtitles anymore. It has been really hard for the last few weeks."       Last edited by Kendra Opitz, COA on 10/26/2020  9:36 AM.      Referring physician: Mast, Man X, NP [address],  [place] [address]  HISTORICAL INFORMATION:   Selected notes from the MEDICAL RECORD NUMBER       CURRENT MEDICATIONS: No current outpatient medications on file. (Ophthalmic Drugs)   No current facility-administered medications for this visit. (Ophthalmic Drugs)   Current Outpatient Medications (Other)  Medication Sig   acetaminophen (TYLENOL) 500 MG tablet Take 500 mg by mouth every 6 (six) hours as needed.   beta carotene w/minerals (OCUVITE) tablet Take 1 tablet by mouth daily.   Cholecalciferol (VITAMIN D-3) 1000 units CAPS Take 1,000 Units by mouth daily.   furosemide (LASIX) 20 MG tablet Take 0.5 tablets (10 mg total) by mouth every other day.   levothyroxine (SYNTHROID) 50 MCG tablet TAKE 1 TABLET ONCE DAILY.   lisinopril (ZESTRIL) 20 MG tablet Take 1 tablet (20 mg total) by mouth daily.   loperamide (IMODIUM A-D) 2 MG tablet Take 2 mg by mouth daily as needed for diarrhea or loose stools.   metoprolol succinate (TOPROL-XL) 25 MG 24 hr tablet TAKE 1 TABLET ONCE DAILY.   potassium chloride SA (KLOR-CON) 20 MEQ tablet Take 0.5 tablets (10 mEq total) by mouth every other day.   No current facility-administered  medications for this visit. (Other)      REVIEW OF SYSTEMS:    ALLERGIES Allergies  Allergen Reactions   Adhesive [Tape] Itching and Rash    Please use "paper" tape   Codeine Nausea Only   Triamterene-Hctz Other (See Comments)    fatigue   Vasotec Diarrhea    PAST MEDICAL HISTORY Past Medical History:  Diagnosis Date   Breast cancer ([place])    Cardiac pacemaker in situ 04/01/2004   Original implant 04/01/04 Dr. Doreatha Lew Indications syncope with Mobitz 2 heart block  RV lead Guidant 4470 52 cm bipolar lead, SN [id number] atrial lead Guidant bipolar serial #[id number].  Medtronic EnPulse Model number [id number], SN PNB [id number]    Carotid bruit 10/25/2010   right   Diverticulosis    H/O syncope    Hearing loss in right ear 05/26/2011   Hiatal hernia    HTN (hypertension), benign    Hyperlipidemia    Melanoma of back ([place]) 2009   Mobitz II    Osteoporosis    Past Surgical History:  Procedure Laterality Date   BREAST BIOPSY  1960   Left   BREAST LUMPECTOMY  1960   left 2017   BREAST LUMPECTOMY WITH RADIOACTIVE SEED LOCALIZATION Left 09/10/2015   Procedure: BREAST  LUMPECTOMY WITH RADIOACTIVE SEED LOCALIZATION;  Surgeon: Excell Seltzer, MD;  Location: [place];  Service: General;  Laterality: Left;   CATARACT EXTRACTION Bilateral 2002   Dr. Katy Fitch   CATARACT EXTRACTION, BILATERAL  2001   INSERT / REPLACE / REMOVE PACEMAKER     LOM HEMOTYMPANUM THROAT & SINUS BX  1977   PACEMAKER GENERATOR CHANGE N/A 08/16/2012   Procedure: PACEMAKER GENERATOR CHANGE;  Surgeon: Deboraha Sprang, MD;  Location: [facility] C[facility] LAB;  Service: Cardiovascular;  Laterality: N/A;   PACEMAKER INSERTION  2006   Medtronic DDDR E2DRO1, SERIAL #  [id number] H   surgery of ear      FAMILY HISTORY Family History  Problem Relation Age of Onset   Stroke Mother 52   Kidney disease Father 85   Cirrhosis Son     SOCIAL HISTORY Social History   Tobacco Use   Smoking status: Former    Packs/day: 0.33    Years:  10.00    Pack years: 3.30    Types: Cigarettes    Quit date: 02/27/1946    Years since quitting: 74.7   Smokeless tobacco: Never  Vaping Use   Vaping Use: Never used  Substance Use Topics   Alcohol use: Yes    Alcohol/week: 7.0 standard drinks    Types: 7 Glasses of wine per week    Comment: wine   Drug use: No         OPHTHALMIC EXAM:  Base Eye Exam     Visual Acuity (ETDRS)       Right Left   Dist cc 20/50 -1 CF at face   Dist ph cc NI          Tonometry (Tonopen, 9:38 AM)       Right Left   Pressure 12 12         Pupils       Pupils Dark Light Shape React APD   Right PERRL 5 4 Round Sluggish None   Left PERRL 5 4 Round Sluggish None         Visual Fields       Left Right   Restrictions Partial outer superior temporal, inferior temporal, superior nasal deficiencies          Neuro/Psych     Oriented x3: Yes   Mood/Affect: Normal         Dilation     Right eye: 1.0% Mydriacyl, 2.5% Phenylephrine @ 9:38 AM           Slit Lamp and Fundus Exam     External Exam       Right Left   External Normal Normal         Slit Lamp Exam       Right Left   Lids/Lashes Normal Normal   Conjunctiva/Sclera White and quiet White and quiet   Cornea Clear Clear   Anterior Chamber Deep and quiet Deep and quiet   Iris Round and reactive Round and reactive   Lens Open posterior capsule Posterior chamber intraocular lens   Anterior Vitreous Normal Normal         Fundus Exam       Right Left   Posterior Vitreous Posterior vitreous detachment    Disc Normal    C/D Ratio 0.5    Macula Atrophy, Retinal pigment epithelial atrophy, Age related macular degeneration, Early age related macular degeneration, Drusen, no exudates, no hemorrhage, no macular thickening, Geographic atrophy 3 DA size not in the FAZ, reddish  hue temporal    Vessels Normal    Periphery Normal, no holes or tears, 28D and 20 D examination to the ora serrata              IMAGING AND PROCEDURES  Imaging and Procedures for 10/26/20  OCT, Retina - OU - Both Eyes       Right Eye Quality was good. Scan locations included subfoveal. Central Foveal Thickness: 288. Progression has been stable. Findings include abnormal foveal contour, retinal drusen , subretinal scarring, subretinal hyper-reflective material, no IRF.   Left Eye Quality was good. Scan locations included subfoveal. Central Foveal Thickness: 306. Progression has been stable. Findings include abnormal foveal contour, central retinal atrophy, outer retinal atrophy.   Notes Visual acuity preserved OD with less subretinal fluid today at 8-week follow-up.  We will repeat injection today and maintain 8 to 9-week interval follow-up exams      Intravitreal Injection, Pharmacologic Agent - OD - Right Eye       Time Out 10/26/2020. 10:43 AM. Confirmed correct patient, procedure, site, and patient consented.   Anesthesia Topical anesthesia was used. Anesthetic medications included Akten 3.5%.   Procedure Preparation included 10% betadine to eyelids, 5% betadine to ocular surface, Ofloxacin . A 30 gauge needle was used.   Injection: 2 mg aflibercept 2 MG/0.05ML   Route: Intravitreal, Site: Right Eye   NDC: A3590391, Lot: [id number], Waste: 0 mL   Post-op Post injection exam found visual acuity of at least counting fingers. The patient tolerated the procedure well. There were no complications. Post injection medications were not given.              ASSESSMENT/PLAN:  Exudative age-related macular degeneration of right eye with active choroidal neovascularization (HCC) Stable acuity and macular findings today at 8-week follow-up.  We will repeat injection today to maintain acuity and prevent recurrence vision limited by dry atrophic change in the fovea as well as subfoveal scarring  Exudative age-related macular degeneration of left eye with inactive choroidal neovascularization  (HCC) Poor OCT today we will monitor next      ICD-10-CM   1. Exudative age-related macular degeneration of right eye with active choroidal neovascularization (HCC)  H35.3211 OCT, Retina - OU - Both Eyes    Intravitreal Injection, Pharmacologic Agent - OD - Right Eye    aflibercept (EYLEA) SOLN 2 mg    2. Exudative age-related macular degeneration of left eye with inactive choroidal neovascularization ([place])  H35.3222       1.  OD with chronic active CNVM subfoveal, vision acuity limited by subfoveal scarring and atrophy.  Stable however findings of a OCT and examination at 8-week.  Repeat injection Eylea today  2.  Follow-up next OD in 9 weeks dilate OU  3.  Ophthalmic Meds Ordered this visit:  Meds ordered this encounter  Medications   aflibercept (EYLEA) SOLN 2 mg       Return in about 9 weeks (around 12/28/2020) for DILATE OU, EYLEA OCT, OD.  There are no Patient Instructions on file for this visit.   Explained the diagnoses, plan, and follow up with the patient and they expressed understanding.  Patient expressed understanding of the importance of proper follow up care.   Clent Demark Aurelie Dicenzo M.D. Diseases & Surgery of the Retina and Vitreous Retina & Diabetic [place] 10/26/20     Abbreviations: M myopia (nearsighted); A astigmatism; H hyperopia (farsighted); P presbyopia; Mrx spectacle prescription;  CTL contact lenses; OD right eye; OS left  eye; OU both eyes  XT exotropia; ET esotropia; PEK punctate epithelial keratitis; PEE punctate epithelial erosions; DES dry eye syndrome; MGD meibomian gland dysfunction; ATs artificial tears; PFAT's preservative free artificial tears; [place] nuclear sclerotic cataract; PSC posterior subcapsular cataract; ERM epi-retinal membrane; PVD posterior vitreous detachment; RD retinal detachment; DM diabetes mellitus; DR diabetic retinopathy; NPDR non-proliferative diabetic retinopathy; PDR proliferative diabetic retinopathy; CSME clinically  significant macular edema; DME diabetic macular edema; dbh dot blot hemorrhages; CWS cotton wool spot; POAG primary open angle glaucoma; C/D cup-to-disc ratio; HVF humphrey visual field; GVF goldmann visual field; OCT optical coherence tomography; IOP intraocular pressure; BRVO Branch retinal vein occlusion; CRVO central retinal vein occlusion; CRAO central retinal artery occlusion; BRAO branch retinal artery occlusion; RT retinal tear; SB scleral buckle; PPV pars plana vitrectomy; VH Vitreous hemorrhage; PRP panretinal laser photocoagulation; IVK intravitreal kenalog; VMT vitreomacular traction; MH Macular hole;  NVD neovascularization of the disc; NVE neovascularization elsewhere; AREDS age related eye disease study; ARMD age related macular degeneration; POAG primary open angle glaucoma; EBMD epithelial/anterior basement membrane dystrophy; ACIOL anterior chamber intraocular lens; IOL intraocular lens; PCIOL posterior chamber intraocular lens; Phaco/IOL phacoemulsification with intraocular lens placement; [place] photorefractive keratectomy; LASIK laser assisted in situ keratomileusis; HTN hypertension; DM diabetes mellitus; COPD chronic obstructive pulmonary disease

## 2020-11-17 ENCOUNTER — Encounter: Payer: Self-pay | Admitting: Family Medicine

## 2020-11-17 ENCOUNTER — Non-Acute Institutional Stay: Payer: Medicare Other | Admitting: Orthopedic Surgery

## 2020-11-17 DIAGNOSIS — M25562 Pain in left knee: Secondary | ICD-10-CM

## 2020-11-17 DIAGNOSIS — G8929 Other chronic pain: Secondary | ICD-10-CM

## 2020-11-17 DIAGNOSIS — M25561 Pain in right knee: Secondary | ICD-10-CM

## 2020-11-18 ENCOUNTER — Encounter: Payer: Self-pay | Admitting: Orthopedic Surgery

## 2020-11-18 MED ORDER — ACETAMINOPHEN 500 MG PO TABS: 1000.0000 mg | ORAL_TABLET | Freq: Two times a day (BID) | ORAL | 0 refills | Status: DC

## 2020-11-18 NOTE — Progress Notes (Signed)
Location:  [place] Room Number: 833 Place of Service:  ALF [phone]) Provider:  Windell Moulding, AGNP-C  Mast, Man X, NP  Patient Care Team: Mast, Man X, NP as PCP - General (Internal Medicine) Debara Pickett Nadean Corwin, MD as PCP - Cardiology (Cardiology)  Extended Emergency Contact Information Primary Emergency Contact: Anastasia Fiedler, [place] Phone: [phone] Relation: Son Secondary Emergency Contact: [place] Phone: [phone] Relation: Friend  Code Status:  DNR Goals of care: Advanced Directive information Advanced Directives 10/19/2020  Does Patient Have a Medical Advance Directive? Yes  Type of Paramedic of [place];Living will;Out of facility DNR (pink MOST or yellow form)  Does patient want to make changes to medical advance directive? No - Patient declined  Copy of [place] in Chart? Yes - validated most recent copy scanned in chart (See row information)  Pre-existing out of facility DNR order (yellow form or pink MOST form) Yellow form placed in chart (order not valid for inpatient use);Pink MOST form placed in chart (order not valid for inpatient use)     Chief Complaint  Patient presents with   Acute Visit    Knee pain    HPI:  Pt is a 87 y.o. female seen today for bilateral knee pain.   In the past few weeks, she is ambulating less due to knee pain. Pain rated 4/10, described as stiff/sharp, intermittent throughout the day. Right knee worse than left. Taking tylenol 650 mg prn without relief. No recent falls or injuries. Ambulates with walker.    Past Medical History:  Diagnosis Date   Breast cancer [facility])    Cardiac pacemaker in situ 04/01/2004   Original implant 04/01/04 Dr. Doreatha Lew Indications syncope with Mobitz 2 heart block  RV lead Guidant 4470 52 cm bipolar lead, SN [id number] atrial lead Guidant bipolar serial #[id number].  Medtronic EnPulse  Model number [id number], SN PNB [id number]    Carotid bruit 10/25/2010   right   Diverticulosis    H/O syncope    Hearing loss in right ear 05/26/2011   Hiatal hernia    HTN (hypertension), benign    Hyperlipidemia    Melanoma of back ([place]) 2009   Mobitz II    Osteoporosis    Past Surgical History:  Procedure Laterality Date   BREAST BIOPSY  1960   Left   BREAST LUMPECTOMY  1960   left 2017   BREAST LUMPECTOMY WITH RADIOACTIVE SEED LOCALIZATION Left 09/10/2015   Procedure: BREAST LUMPECTOMY WITH RADIOACTIVE SEED LOCALIZATION;  Surgeon: Excell Seltzer, MD;  Location: [place];  Service: General;  Laterality: Left;   CATARACT EXTRACTION Bilateral 2002   Dr. Katy Fitch   CATARACT EXTRACTION, BILATERAL  2001   INSERT / REPLACE / REMOVE PACEMAKER     LOM HEMOTYMPANUM THROAT & SINUS BX  1977   PACEMAKER GENERATOR CHANGE N/A 08/16/2012   Procedure: PACEMAKER GENERATOR CHANGE;  Surgeon: Deboraha Sprang, MD;  Location: [facility] CATH LAB;  Service: Cardiovascular;  Laterality: N/A;   PACEMAKER INSERTION  2006   Medtronic DDDR E2DRO1, SERIAL #  [id number] H   surgery of ear      Allergies  Allergen Reactions   Adhesive [Tape] Itching and Rash    Please use "paper" tape   Codeine Nausea Only   Triamterene-Hctz Other (See Comments)    fatigue   Vasotec Diarrhea    Outpatient Encounter Medications as of 11/17/2020  Medication Sig   acetaminophen (TYLENOL) 500 MG tablet Take 500 mg by mouth every 6 (six) hours as needed.   beta carotene w/minerals (OCUVITE) tablet Take 1 tablet by mouth daily.   Cholecalciferol (VITAMIN D-3) 1000 units CAPS Take 1,000 Units by mouth daily.   furosemide (LASIX) 20 MG tablet Take 0.5 tablets (10 mg total) by mouth every other day.   levothyroxine (SYNTHROID) 50 MCG tablet TAKE 1 TABLET ONCE DAILY.   lisinopril (ZESTRIL) 20 MG tablet Take 1 tablet (20 mg total) by mouth daily.   loperamide (IMODIUM A-D) 2 MG tablet Take 2 mg by mouth daily as needed for diarrhea or loose  stools.   metoprolol succinate (TOPROL-XL) 25 MG 24 hr tablet TAKE 1 TABLET ONCE DAILY.   potassium chloride SA (KLOR-CON) 20 MEQ tablet Take 0.5 tablets (10 mEq total) by mouth every other day.   No facility-administered encounter medications on file as of 11/17/2020.    Review of Systems  Constitutional:  Negative for activity change, appetite change, fatigue and fever.  Respiratory:  Negative for cough, shortness of breath and wheezing.   Cardiovascular:  Negative for chest pain and leg swelling.  Musculoskeletal:  Positive for arthralgias and gait problem.  Psychiatric/Behavioral:  Negative for dysphoric mood. The patient is not nervous/anxious.    Immunization History  Administered Date(s) Administered   Fluad Quad(high Dose 65+) 12/10/2018   Influenza Split 12/10/2012   Influenza, High Dose Seasonal PF 12/10/2019   Influenza-Unspecified 12/11/2013, 11/28/2014, 12/08/2015, 12/04/2016, 11/27/2017, 12/10/2019   Moderna SARS-COV2 Booster Vaccination 01/06/2020   Moderna Sars-Covid-2 Vaccination 03/03/2019, 03/31/2019   PFIZER(Purple Top)SARS-COV-2 Vaccination 07/27/2020   Pneumococcal Conjugate-13 01/06/2014, 01/08/2014   Pneumococcal Polysaccharide-23 04/20/2009   Tdap 07/03/2000, 05/02/2011   Zoster, Live 10/20/2009   Pertinent  Health Maintenance Due  Topic Date Due   INFLUENZA VACCINE  09/27/2020   DEXA SCAN  Addressed   Fall Risk  08/05/2020 06/17/2020 05/20/2020 01/30/2020 12/11/2019  Falls in the past year? 0 0 0 1 1  Number falls in past yr: 0 0 0 0 0  Injury with Fall? 0 0 - 1 0  Risk for fall due to : - - - - -   Functional Status Survey:    Vitals:   11/18/20 1601  BP: 132/66  Pulse: 66  Resp: 20  Temp: (!) 97.4 F (36.3 C)  SpO2: 94%  Weight: 139 lb 12.8 oz (63.4 kg)  Height: 5\' 3"  (1.6 m)   Body mass index is 24.76 kg/m. Physical Exam Vitals reviewed.  HENT:     Head: Normocephalic.  Cardiovascular:     Rate and Rhythm: Normal rate. Rhythm  irregular.     Pulses: Normal pulses.     Heart sounds: Normal heart sounds. No murmur heard. Pulmonary:     Effort: Pulmonary effort is normal. No respiratory distress.     Breath sounds: Normal breath sounds. No wheezing.  Musculoskeletal:     Right knee: No swelling, deformity, effusion, erythema or crepitus. Normal range of motion. Tenderness present over the MCL.     Left knee: No swelling, deformity, effusion, erythema or crepitus. Normal range of motion. No tenderness.     Right lower leg: No edema.     Left lower leg: No edema.  Skin:    General: Skin is warm and dry.     Capillary Refill: Capillary refill takes less than 2 seconds.  Neurological:     General: No focal deficit present.  Mental Status: She is alert and oriented to person, place, and time.     Gait: Gait abnormal.  Psychiatric:        Mood and Affect: Mood normal.        Behavior: Behavior normal.    Labs reviewed: Recent Labs    04/29/20 0730 06/10/20 0835 09/06/20 0934  NA 141 144 141  K 3.9 4.4 4.1  CL 105 107 105  CO2 27 23 27   GLUCOSE 89 85 77  BUN 42* 32* 34*  CREATININE 1.46* 1.27* 1.23*  CALCIUM 8.9 9.0 9.0   Recent Labs    04/29/20 0730 06/10/20 0835 09/06/20 0934  AST 11 13 14   ALT 8 12 13   BILITOT 0.7 0.6 0.5  PROT 5.8* 6.0* 6.0*   Recent Labs    12/16/19 0700  WBC 10.1  NEUTROABS 5,030  HGB 13.8  HCT 41.3  MCV 89.0  PLT 245   Lab Results  Component Value Date   TSH 2.16 04/29/2020   No results found for: HGBA1C Lab Results  Component Value Date   CHOL 143 07/22/2019   HDL 62 07/22/2019   LDLCALC 64 07/22/2019   TRIG 91 07/22/2019   CHOLHDL 2.3 07/22/2019    Significant Diagnostic Results in last 30 days:  Intravitreal Injection, Pharmacologic Agent - OD - Right Eye  Result Date: 10/26/2020 Time Out 10/26/2020. 10:43 AM. Confirmed correct patient, procedure, site, and patient consented. Anesthesia Topical anesthesia was used. Anesthetic medications included  Akten 3.5%. Procedure Preparation included 10% betadine to eyelids, 5% betadine to ocular surface, Ofloxacin . A 30 gauge needle was used. Injection: 2 mg aflibercept 2 MG/0.05ML   Route: Intravitreal, Site: Right Eye   NDC: [id number], Lot: [id number], Waste: 0 mL Post-op Post injection exam found visual acuity of at least counting fingers. The patient tolerated the procedure well. There were no complications. Post injection medications were not given.   OCT, Retina - OU - Both Eyes  Result Date: 10/26/2020 Right Eye Quality was good. Scan locations included subfoveal. Central Foveal Thickness: 288. Progression has been stable. Findings include abnormal foveal contour, retinal drusen , subretinal scarring, subretinal hyper-reflective material, no IRF. Left Eye Quality was good. Scan locations included subfoveal. Central Foveal Thickness: 306. Progression has been stable. Findings include abnormal foveal contour, central retinal atrophy, outer retinal atrophy. Notes Visual acuity preserved OD with less subretinal fluid today at 8-week follow-up.  We will repeat injection today and maintain 8 to 9-week interval follow-up exams    Assessment/Plan 1. Chronic pain of both knees - right knee pain worse than left, suspect arthritis - some tenderness of MCL right knee, otherwise normal exam - start tylenol 1000 mg po bid - start voltaren gel 1%- apply to right knee tid for pain - cont to ambulate with walker -cont falls safety plan    Family/ staff Communication: plan discussed with patient and nurse  Labs/tests ordered:  none

## 2020-12-08 ENCOUNTER — Ambulatory Visit (INDEPENDENT_AMBULATORY_CARE_PROVIDER_SITE_OTHER): Payer: Medicare Other

## 2020-12-08 DIAGNOSIS — I442 Atrioventricular block, complete: Secondary | ICD-10-CM

## 2020-12-09 LAB — CUP PACEART REMOTE DEVICE CHECK
Battery Impedance: 2537 Ohm
Battery Remaining Longevity: 22 mo
Battery Voltage: 2.74 V
Brady Statistic AP VP Percent: 20 %
Brady Statistic AP VS Percent: 0 %
Brady Statistic AS VP Percent: 80 %
Brady Statistic AS VS Percent: 0 %
Date Time Interrogation Session: 20221012095413
Implantable Lead Implant Date: 20060203
Implantable Lead Implant Date: 20060203
Implantable Lead Location: 753859
Implantable Lead Location: 753860
Implantable Lead Model: 4469
Implantable Lead Model: 4470
Implantable Lead Serial Number: 458590
Implantable Lead Serial Number: 500613
Implantable Pulse Generator Implant Date: 20140620
Lead Channel Impedance Value: 416 Ohm
Lead Channel Impedance Value: 497 Ohm
Lead Channel Pacing Threshold Amplitude: 0.75 V
Lead Channel Pacing Threshold Amplitude: 1.25 V
Lead Channel Pacing Threshold Pulse Width: 0.4 ms
Lead Channel Pacing Threshold Pulse Width: 0.4 ms
Lead Channel Setting Pacing Amplitude: 2.5 V
Lead Channel Setting Pacing Amplitude: 2.5 V
Lead Channel Setting Pacing Pulse Width: 0.4 ms
Lead Channel Setting Sensing Sensitivity: 2.8 mV

## 2020-12-13 ENCOUNTER — Non-Acute Institutional Stay: Payer: Medicare Other | Admitting: Nurse Practitioner

## 2020-12-13 ENCOUNTER — Encounter: Payer: Self-pay | Admitting: Nurse Practitioner

## 2020-12-13 DIAGNOSIS — M1A9XX1 Chronic gout, unspecified, with tophus (tophi): Secondary | ICD-10-CM

## 2020-12-13 DIAGNOSIS — I872 Venous insufficiency (chronic) (peripheral): Secondary | ICD-10-CM | POA: Diagnosis not present

## 2020-12-13 DIAGNOSIS — I10 Essential (primary) hypertension: Secondary | ICD-10-CM | POA: Diagnosis not present

## 2020-12-13 DIAGNOSIS — N1832 Chronic kidney disease, stage 3b: Secondary | ICD-10-CM

## 2020-12-13 DIAGNOSIS — E039 Hypothyroidism, unspecified: Secondary | ICD-10-CM | POA: Diagnosis not present

## 2020-12-13 DIAGNOSIS — R269 Unspecified abnormalities of gait and mobility: Secondary | ICD-10-CM

## 2020-12-13 DIAGNOSIS — M159 Polyosteoarthritis, unspecified: Secondary | ICD-10-CM

## 2020-12-13 DIAGNOSIS — Z950 Presence of cardiac pacemaker: Secondary | ICD-10-CM

## 2020-12-13 DIAGNOSIS — I480 Paroxysmal atrial fibrillation: Secondary | ICD-10-CM

## 2020-12-13 NOTE — Progress Notes (Signed)
Location:   [facility] Room Number: 034 Place of Service:  ALF (13) Provider: Lennie Odor Mast NP  Mast, Man X, NP  Patient Care Team: Mast, Man X, NP as PCP - General (Internal Medicine) Debara Pickett Nadean Corwin, MD as PCP - Cardiology (Cardiology)  Extended Emergency Contact Information Primary Emergency Contact: Anastasia Fiedler, [place] Phone: [phone] Relation: Son Secondary Emergency Contact: [place] Phone: [phone] Relation: Friend  Code Status: DNR Goals of care: Advanced Directive information Advanced Directives 10/19/2020  Does Patient Have a Medical Advance Directive? Yes  Type of Paramedic of [place];Living will;Out of facility DNR (pink MOST or yellow form)  Does patient want to make changes to medical advance directive? No - Patient declined  Copy of [place] in Chart? Yes - validated most recent copy scanned in chart (See row information)  Pre-existing out of facility DNR order (yellow form or pink MOST form) Yellow form placed in chart (order not valid for inpatient use);Pink MOST form placed in chart (order not valid for inpatient use)     Chief Complaint  Patient presents with   Acute Visit    Swelling legs, SOB    HPI:  Pt is a 87 y.o. female seen today for an acute visit for more edematous in BLE, DOE noted, no cough, sputum production, chest pain or palpitation reported. Gradual weight gained #3Ibs 9/22, #4Ibs 10/22.    OA, multiple sites, takes Tylenol.               HTN, blood pressure is controlled on Lisinopril,  Metoprolol. Bun/creat 34/1.23 09/06/20             Hypothyroidism, stable, on Levothyroxine 81mg qd. TSH 2.16 04/29/20             CKD Bun/creat 34/1.23 09/06/20             Gout asymptomatic, uric acid 7.2 06/10/20             BLE edema, chronic,  takes Furosemide             Gait abnormality, walker all the times, may need w/c to go  further.   AVB Mobitz 2, s/p pacemaker.   PAF continue Metoprolol, avoid anticoagulation         Past Medical History:  Diagnosis Date   Breast cancer ([facility]    Cardiac pacemaker in situ 04/01/2004   Original implant 04/01/04 Dr. TDoreatha LewIndications syncope with Mobitz 2 heart block  RV lead Guidant 4470 52 cm [place] [id number] lead Guidant bipolar serial [id number]  Medtronic EnPulse Model number [id number] SN PNB [id number]    Carotid bruit 10/25/2010   right   Diverticulosis    H/O syncope    Hearing loss in right ear 05/26/2011   Hiatal hernia    HTN (hypertension), benign    Hyperlipidemia    Melanoma of back ([place] 2009   Mobitz II    Osteoporosis    Past Surgical History:  Procedure Laterality Date   BREAST BIOPSY  1960   Left   BREAST LUMPECTOMY  1960   left 2017   BREAST LUMPECTOMY WITH RADIOACTIVE SEED LOCALIZATION Left 09/10/2015   Procedure: BREAST LUMPECTOMY WITH RADIOACTIVE SEED LOCALIZATION;  Surgeon: BExcell Seltzer MD;  [place]  Service: General;  Laterality: Left;   CATARACT EXTRACTION Bilateral 2002   Dr. GKaty Fitch  CATARACT EXTRACTION, BILATERAL  2001   INSERT / REPLACE / REMOVE PACEMAKER     LOM HEMOTYMPANUM THROAT & SINUS BX  1977   PACEMAKER GENERATOR CHANGE N/A 08/16/2012   Procedure: PACEMAKER GENERATOR CHANGE;  Surgeon: Deboraha Sprang, MD;  Location: [facility] CATH LAB;  Service: Cardiovascular;  Laterality: N/A;   PACEMAKER INSERTION  2006   Medtronic DDDR E2DRO1, SERIAL #  [id number]   surgery of ear      Allergies  Allergen Reactions   Adhesive [Tape] Itching and Rash    Please use "paper" tape   Codeine Nausea Only   Triamterene-Hctz Other (See Comments)    fatigue   Vasotec Diarrhea    Allergies as of 12/13/2020       Reactions   Adhesive [tape] Itching, Rash   Please use "paper" tape   Codeine Nausea Only   Triamterene-hctz Other (See Comments)   fatigue   Vasotec Diarrhea        Medication List         Accurate as of December 13, 2020  2:03 PM. If you have any questions, ask your nurse or doctor.          acetaminophen 500 MG tablet Commonly known as: TYLENOL Take 500 mg by mouth every 6 (six) hours as needed.   acetaminophen 500 MG tablet Commonly known as: TYLENOL Take 2 tablets (1,000 mg total) by mouth 2 (two) times daily.   beta carotene w/minerals tablet Take 1 tablet by mouth daily.   diclofenac Sodium 1 % Gel Commonly known as: VOLTAREN Apply 1 g topically in the morning, at noon, and at bedtime.   furosemide 20 MG tablet Commonly known as: LASIX Take 0.5 tablets (10 mg total) by mouth every other day.   levothyroxine 50 MCG tablet Commonly known as: SYNTHROID TAKE 1 TABLET ONCE DAILY.   lisinopril 20 MG tablet Commonly known as: ZESTRIL Take 1 tablet (20 mg total) by mouth daily.   loperamide 2 MG tablet Commonly known as: IMODIUM A-D Take 2 mg by mouth daily as needed for diarrhea or loose stools.   metoprolol succinate 25 MG 24 hr tablet Commonly known as: TOPROL-XL TAKE 1 TABLET ONCE DAILY.   potassium chloride SA 20 MEQ tablet Commonly known as: KLOR-CON Take 0.5 tablets (10 mEq total) by mouth every other day.   Vitamin D-3 25 MCG (1000 UT) Caps Take 1,000 Units by mouth daily.        Review of Systems  Constitutional:  Positive for unexpected weight change. Negative for fatigue and fever.       Gradual weight gained in the past 2 months, about 3-4Ibs a month  HENT:  Positive for hearing loss. Negative for congestion and voice change.        Hearing aids.   Eyes:  Negative for visual disturbance.       S/p inj, macular degeneration  Respiratory:  Positive for shortness of breath. Negative for cough.        DOE  Cardiovascular:  Positive for leg swelling.  Gastrointestinal:  Negative for abdominal pain and constipation.  Genitourinary:  Positive for frequency.       Every 2-3 hours at night  Musculoskeletal:  Positive for arthralgias and  gait problem.  Skin:  Negative for color change.       Lateral mid right lower leg surgical scar  Neurological:  Negative for speech difficulty, weakness and light-headedness.       Tingling numbness in hands.  Psychiatric/Behavioral:  Negative for behavioral problems and sleep disturbance. The patient is not nervous/anxious.    Immunization History  Administered Date(s) Administered   Fluad Quad(high Dose 65+) 12/10/2018   Influenza Split 12/10/2012   Influenza, High Dose Seasonal PF 12/10/2019   Influenza-Unspecified 12/11/2013, 11/28/2014, 12/08/2015, 12/04/2016, 11/27/2017, 12/10/2019   Moderna SARS-COV2 Booster Vaccination 01/06/2020   Moderna Sars-Covid-2 Vaccination 03/03/2019, 03/31/2019   PFIZER(Purple Top)SARS-COV-2 Vaccination 07/27/2020   Pneumococcal Conjugate-13 01/06/2014, 01/08/2014   Pneumococcal Polysaccharide-23 04/20/2009   Tdap 07/03/2000, 05/02/2011   Zoster, Live 10/20/2009   Pertinent  Health Maintenance Due  Topic Date Due   INFLUENZA VACCINE  09/27/2020   DEXA SCAN  Addressed   Fall Risk  08/05/2020 06/17/2020 05/20/2020 01/30/2020 12/11/2019  Falls in the past year? 0 0 0 1 1  Number falls in past yr: 0 0 0 0 0  Injury with Fall? 0 0 - 1 0  Risk for fall due to : - - - - -   Functional Status Survey:    Vitals:   12/13/20 0943  BP: (!) 150/80  Pulse: 60  Resp: 20  Temp: (!) 97.4 F (36.3 C)  SpO2: 96%  Weight: 143 lb 9.6 oz (65.1 kg)   Body mass index is 25.44 kg/m. Physical Exam Vitals and nursing note reviewed.  Constitutional:      Appearance: Normal appearance.  HENT:     Head: Normocephalic and atraumatic.     Mouth/Throat:     Mouth: Mucous membranes are moist.  Eyes:     Extraocular Movements: Extraocular movements intact.     Conjunctiva/sclera: Conjunctivae normal.     Pupils: Pupils are equal, round, and reactive to light.     Comments: Mild ectropion R+L lower eyelids  Cardiovascular:     Rate and Rhythm: Normal rate and  regular rhythm.     Heart sounds: Murmur heard.  Pulmonary:     Effort: Pulmonary effort is normal.     Breath sounds: No wheezing or rales.  Abdominal:     General: Bowel sounds are normal.     Palpations: Abdomen is soft.     Tenderness: There is no abdominal tenderness.  Musculoskeletal:     Cervical back: Normal range of motion and neck supple.     Right lower leg: Edema present.     Left lower leg: Edema present.     Comments: Ambulates with walker. Fullness left popliteal area. Edema BLE, 2+. Dorsalis pedis pulses present.   Skin:    General: Skin is warm and dry.     Comments: Mild chronic venous insufficiency skin changes BLE. A golf ball sized, hard, irregular, orange peel skin appearance left breast.  Opted out workup and tx.   Neurological:     General: No focal deficit present.     Mental Status: She is alert and oriented to person, place, and time. Mental status is at baseline.     Motor: No weakness.     Coordination: Coordination normal.     Gait: Gait abnormal.  Psychiatric:        Mood and Affect: Mood normal.        Behavior: Behavior normal.        Thought Content: Thought content normal.        Judgment: Judgment normal.    Labs reviewed: Recent Labs    04/29/20 0730 06/10/20 0835 09/06/20 0934  NA 141 144 141  K 3.9 4.4 4.1  CL 105 107 105  CO2  _0 GLUCOSE 89 85 77  BUN 42* 32* 34*  CREATININE 1.46* 1.27* 1.23*  CALCIUM 8.9 9.0 9.0   Recent Labs    04/29/20 0730 06/10/20 0835 09/06/20 0934  AST _1 ALT _2 BILITOT 0.7 0.6 0.5  PROT 5.8* 6.0* 6.0*   Recent Labs    12/16/19 0700  WBC 10.1  NEUTROABS 5,030  HGB 13.8  HCT 41.3  MCV 89.0  PLT 245   Lab Results  Component Value Date   TSH 2.16 04/29/2020   No results found for: HGBA1C Lab Results  Component Value Date   CHOL 143 07/22/2019   HDL 62 07/22/2019   LDLCALC 64 07/22/2019   TRIG 91 07/22/2019   CHOLHDL 2.3 07/22/2019    Significant Diagnostic  Results in last 30 days:  CUP PACEART REMOTE DEVICE CHECK  Result Date: 12/09/2020 Scheduled remote reviewed. Normal device function.  13 AF events, all brief, CVR Known PAF. No OAC due to fall risk and low AF burden per prior report. Burden 0.6%. Next remote 91 days. LR   Assessment/Plan: Edema of both lower extremities due to peripheral venous insufficiency More edematous in BLE, DOE noted, no cough, sputum production, chest pain or palpitation reported. Will increase Furosemide 38m, Kcl 173m to qd, update CBC/diff, CMP/eGFR one week.   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   Essential hypertension blood pressure is controlled on Lisinopril,  Metoprolol. Bun/creat 34/1.23 09/06/20  Hypothyroidism stable, on Levothyroxine 5073mqd. TSH 2.16 04/29/20  CKD (chronic kidney disease) stage 3, GFR 30-59 ml/min (HCC) Bun/creat 34/1.23 09/06/20  Gout uric acid 7.2 06/10/20  Gait abnormality , walker all the times, may need w/c to go further.   Pacemaker AVB Mobitz 2, s/p pacemaker.   PAF (paroxysmal atrial fibrillation) (HCC) Heart rate is in control, continue Metoprolol, avoid anticoagulation, 08/20/20 MOST comfort measures No ABT/IVF      Family/ staff Communication: plan of care reviewed with the patient and charge nurse.   Labs/tests ordered: CBC/diff, CMP/eGFR one week.   Time spend 40 minutes.

## 2020-12-13 NOTE — Assessment & Plan Note (Signed)
uric acid 7.2 06/10/20

## 2020-12-13 NOTE — Assessment & Plan Note (Signed)
multiple sites, takes Tylenol  

## 2020-12-13 NOTE — Assessment & Plan Note (Signed)
More edematous in BLE, DOE noted, no cough, sputum production, chest pain or palpitation reported. Will increase Furosemide 79m, Kcl 144m to qd, update CBC/diff, CMP/eGFR one week.

## 2020-12-13 NOTE — Assessment & Plan Note (Signed)
stable, on Levothyroxine 70mcg qd. TSH 2.16 04/29/20

## 2020-12-13 NOTE — Assessment & Plan Note (Signed)
,   walker all the times, may need w/c to go further.

## 2020-12-13 NOTE — Assessment & Plan Note (Signed)
AVB Mobitz 2, s/p pacemaker.

## 2020-12-13 NOTE — Assessment & Plan Note (Signed)
blood pressure is controlled on Lisinopril,  Metoprolol. Bun/creat 34/1.23 09/06/20

## 2020-12-13 NOTE — Assessment & Plan Note (Addendum)
Heart rate is in control, continue Metoprolol, avoid anticoagulation, 08/20/20 MOST comfort measures No ABT/IVF

## 2020-12-13 NOTE — Assessment & Plan Note (Signed)
Bun/creat 34/1.23 09/06/20

## 2020-12-16 NOTE — Progress Notes (Signed)
Remote pacemaker transmission.   

## 2020-12-21 LAB — BASIC METABOLIC PANEL
BUN: 35 — AB (ref 4–21)
CO2: 25 — AB (ref 13–22)
Chloride: 102 (ref 99–108)
Creatinine: 1.3 — AB (ref 0.5–1.1)
Glucose: 83
Potassium: 3.9 (ref 3.4–5.3)
Sodium: 140 (ref 137–147)

## 2020-12-21 LAB — HEPATIC FUNCTION PANEL
ALT: 19 (ref 7–35)
AST: 12 — AB (ref 13–35)
Alkaline Phosphatase: 98 (ref 25–125)
Bilirubin, Total: 0.8

## 2020-12-21 LAB — CBC: RBC: 4.35 (ref 3.87–5.11)

## 2020-12-21 LAB — CBC AND DIFFERENTIAL
HCT: 39 (ref 36–46)
Hemoglobin: 13 (ref 12.0–16.0)
Neutrophils Absolute: 5763
Platelets: 192 (ref 150–399)
WBC: 8.5

## 2020-12-21 LAB — COMPREHENSIVE METABOLIC PANEL
Albumin: 3.3 — AB (ref 3.5–5.0)
Globulin: 2

## 2020-12-28 ENCOUNTER — Ambulatory Visit (INDEPENDENT_AMBULATORY_CARE_PROVIDER_SITE_OTHER): Payer: Medicare Other | Admitting: Ophthalmology

## 2020-12-28 ENCOUNTER — Other Ambulatory Visit: Payer: Self-pay

## 2020-12-28 ENCOUNTER — Encounter (INDEPENDENT_AMBULATORY_CARE_PROVIDER_SITE_OTHER): Payer: Self-pay | Admitting: Ophthalmology

## 2020-12-28 DIAGNOSIS — H353222 Exudative age-related macular degeneration, left eye, with inactive choroidal neovascularization: Secondary | ICD-10-CM | POA: Diagnosis not present

## 2020-12-28 DIAGNOSIS — H353211 Exudative age-related macular degeneration, right eye, with active choroidal neovascularization: Secondary | ICD-10-CM

## 2020-12-28 MED ORDER — AFLIBERCEPT 2MG/0.05ML IZ SOLN FOR KALEIDOSCOPE
2.0000 mg | INTRAVITREAL | Status: AC | PRN
  Administered 2020-12-28: 2 mg via INTRAVITREAL

## 2020-12-28 NOTE — Progress Notes (Signed)
12/28/2020     CHIEF COMPLAINT Patient presents for  Chief Complaint  Patient presents with   Retina Follow Up      HISTORY OF PRESENT ILLNESS: [name] [name] is a 87 y.o. female who presents to the clinic today for:   HPI     Retina Follow Up   Patient presents with  Wet AMD.  In right eye.  This started 9 weeks ago.  Severity is mild.  Duration of 9 weeks.  Since onset it is gradually worsening.        Comments   9 week fu OU oct Eylea OD. Pt  states within the last 3-4 weeks her vision seems to be worsening.  However vision appears to improve during the day upon awakening Pt states "it is harder for me to read, I read big print books." Denies new FOL or floaters.       Last edited by Hurman Horn, MD on 12/28/2020 10:57 AM.      Referring physician: Mast, Man X, NP [address],  [place] [address]  HISTORICAL INFORMATION:   Selected notes from the MEDICAL RECORD NUMBER       CURRENT MEDICATIONS: No current outpatient medications on file. (Ophthalmic Drugs)   No current facility-administered medications for this visit. (Ophthalmic Drugs)   Current Outpatient Medications (Other)  Medication Sig   acetaminophen (TYLENOL) 500 MG tablet Take 500 mg by mouth every 6 (six) hours as needed.   acetaminophen (TYLENOL) 500 MG tablet Take 2 tablets (1,000 mg total) by mouth 2 (two) times daily.   beta carotene w/minerals (OCUVITE) tablet Take 1 tablet by mouth daily.   Cholecalciferol (VITAMIN D-3) 1000 units CAPS Take 1,000 Units by mouth daily.   diclofenac Sodium (VOLTAREN) 1 % GEL Apply 1 g topically in the morning, at noon, and at bedtime.   furosemide (LASIX) 20 MG tablet Take 0.5 tablets (10 mg total) by mouth every other day.   levothyroxine (SYNTHROID) 50 MCG tablet TAKE 1 TABLET ONCE DAILY.   lisinopril (ZESTRIL) 20 MG tablet Take 1 tablet (20 mg total) by mouth daily.   loperamide (IMODIUM A-D) 2 MG tablet Take 2 mg by mouth daily as needed for  diarrhea or loose stools.   metoprolol succinate (TOPROL-XL) 25 MG 24 hr tablet TAKE 1 TABLET ONCE DAILY.   potassium chloride SA (KLOR-CON) 20 MEQ tablet Take 0.5 tablets (10 mEq total) by mouth every other day.   No current facility-administered medications for this visit. (Other)      REVIEW OF SYSTEMS:    ALLERGIES Allergies  Allergen Reactions   Adhesive [Tape] Itching and Rash    Please use "paper" tape   Codeine Nausea Only   Triamterene-Hctz Other (See Comments)    fatigue   Vasotec Diarrhea    PAST MEDICAL HISTORY Past Medical History:  Diagnosis Date   Breast cancer ([place])    Cardiac pacemaker in situ 04/01/2004   Original implant 04/01/04 Dr. Doreatha Lew Indications syncope with Mobitz 2 heart block  RV lead Guidant 4470 52 cm bipolar lead, SN [id number] atrial lead Guidant bipolar serial #[id number].  Medtronic EnPulse Model number [id number], SN PNB [id number]    Carotid bruit 10/25/2010   right   Diverticulosis    H/O syncope    Hearing loss in right ear 05/26/2011   Hiatal hernia    HTN (hypertension), benign    Hyperlipidemia    Melanoma of back ([place]) 2009   Mobitz II  Osteoporosis    Past Surgical History:  Procedure Laterality Date   BREAST BIOPSY  1960   Left   BREAST LUMPECTOMY  1960   left 2017   BREAST LUMPECTOMY WITH RADIOACTIVE SEED LOCALIZATION Left 09/10/2015   Procedure: BREAST LUMPECTOMY WITH RADIOACTIVE SEED LOCALIZATION;  Surgeon: Excell Seltzer, MD;  Location: [facility];  Service: General;  Laterality: Left;   CATARACT EXTRACTION Bilateral 2002   Dr. Katy Fitch   CATARACT EXTRACTION, BILATERAL  2001   INSERT / REPLACE / REMOVE PACEMAKER     LOM HEMOTYMPANUM THROAT & SINUS BX  1977   PACEMAKER GENERATOR CHANGE N/A 08/16/2012   Procedure: PACEMAKER GENERATOR CHANGE;  Surgeon: Deboraha Sprang, MD;  Location[facility] C[facility];  Service: Cardiovascular;  Laterality: N/A;   PACEMAKER INSERTION  2006   Medtronic DDDR E2DRO1, SERIAL #  [id number]   surgery of ear       FAMILY HISTORY Family History  Problem Relation Age of Onset   Stroke Mother 2   Kidney disease Father 34   Cirrhosis Son     SOCIAL HISTORY Social History   Tobacco Use   Smoking status: Former    Packs/day: 0.33    Years: 10.00    Pack years: 3.30    Types: Cigarettes    Quit date: 02/27/1946    Years since quitting: 74.8   Smokeless tobacco: Never  Vaping Use   Vaping Use: Never used  Substance Use Topics   Alcohol use: Yes    Alcohol/week: 7.0 standard drinks    Types: 7 Glasses of wine per week    Comment: wine   Drug use: No         OPHTHALMIC EXAM:  Base Eye Exam     Visual Acuity (ETDRS)       Right Left   Dist cc 20/50 +1 CF at 1'   Dist ph cc NI NI         Tonometry (Tonopen, 10:13 AM)       Right Left   Pressure 21 10         Pupils       Pupils Dark Light Shape React APD   Right PERRL 5 4 Round Sluggish None   Left PERRL 5 4 Round Sluggish None         Extraocular Movement       Right Left    Full Full         Neuro/Psych     Oriented x3: Yes   Mood/Affect: Normal         Dilation     Both eyes: 1.0% Mydriacyl, 2.5% Phenylephrine @ 10:13 AM           Slit Lamp and Fundus Exam     External Exam       Right Left   External Normal Normal         Slit Lamp Exam       Right Left   Lids/Lashes Normal Normal   Conjunctiva/Sclera White and quiet White and quiet   Cornea Clear Clear   Anterior Chamber Deep and quiet Deep and quiet   Iris Round and reactive Round and reactive   Lens Open posterior capsule Posterior chamber intraocular lens   Anterior Vitreous Normal Normal         Fundus Exam       Right Left   Posterior Vitreous Posterior vitreous detachment    Disc Normal    C/D Ratio 0.5  Macula Atrophy, Retinal pigment epithelial atrophy, Age related macular degeneration, Early age related macular degeneration, Drusen, no exudates, no hemorrhage, no macular thickening, Geographic  atrophy 3 DA size not in the FAZ, reddish hue temporal    Vessels Normal    Periphery Normal, no holes or tears, 28D and 20 D examination to the ora serrata             IMAGING AND PROCEDURES  Imaging and Procedures for 12/28/20  Intravitreal Injection, Pharmacologic Agent - OD - Right Eye       Time Out 12/28/2020. 10:57 AM. Confirmed correct patient, procedure, site, and patient consented.   Anesthesia Topical anesthesia was used. Anesthetic medications included Akten 3.5%.   Procedure Preparation included 10% betadine to eyelids, 5% betadine to ocular surface, Ofloxacin . A 30 gauge needle was used.   Injection: 2 mg aflibercept 2 MG/0.05ML   Route: Intravitreal, Site: Right Eye   NDC: [id number], Lot: [id number], Waste: 0 mL   Post-op Post injection exam found visual acuity of at least counting fingers. The patient tolerated the procedure well. There were no complications. The patient received written and verbal post procedure care education. Post injection medications included ocuflox.      OCT, Retina - OU - Both Eyes       Right Eye Quality was good. Scan locations included subfoveal. Central Foveal Thickness: 276. Progression has been stable. Findings include abnormal foveal contour, retinal drusen , subretinal scarring, subretinal hyper-reflective material, no IRF.   Left Eye Quality was good. Scan locations included subfoveal. Central Foveal Thickness: 286. Progression has been stable. Findings include abnormal foveal contour, central retinal atrophy, outer retinal atrophy.   Notes Visual acuity preserved OD with less subretinal fluid today at 9-week follow-up.  We will repeat injection today and maintain 9-week interval follow-up exams              ASSESSMENT/PLAN:  Exudative age-related macular degeneration of right eye with active choroidal neovascularization (HCC) OD with history of recurrences of CNVM and stable acuity now at 9-week interval  after extension to 9 weeks from 8.  Repeat injection today and consider extension to 10-week follow-up interval  Exudative age-related macular degeneration of left eye with inactive choroidal neovascularization (HCC) OS, no signs of recurrent CNVM     ICD-10-CM   1. Exudative age-related macular degeneration of right eye with active choroidal neovascularization (HCC)  H35.3211 Intravitreal Injection, Pharmacologic Agent - OD - Right Eye    OCT, Retina - OU - Both Eyes    aflibercept (EYLEA) SOLN 2 mg    2. Exudative age-related macular degeneration of left eye with inactive choroidal neovascularization (HCC)  H35.3222       1.  2.  3.  Ophthalmic Meds Ordered this visit:  Meds ordered this encounter  Medications   aflibercept (EYLEA) SOLN 2 mg       Return in about 10 weeks (around 03/08/2021) for DILATE OU, EYLEA OCT, OD.  There are no Patient Instructions on file for this visit.   Explained the diagnoses, plan, and follow up with the patient and they expressed understanding.  Patient expressed understanding of the importance of proper follow up care.   Clent Demark Ausar Georgiou M.D. Diseases & Surgery of the Retina and Vitreous Retina & Diabetic [place] 12/28/20     Abbreviations: M myopia (nearsighted); A astigmatism; H hyperopia (farsighted); P presbyopia; Mrx spectacle prescription;  CTL contact lenses; OD right eye; OS left eye; OU both eyes  XT exotropia; ET esotropia; PEK punctate epithelial keratitis; PEE punctate epithelial erosions; DES dry eye syndrome; MGD meibomian gland dysfunction; ATs artificial tears; PFAT's preservative free artificial tears; [place] nuclear sclerotic cataract; PSC posterior subcapsular cataract; ERM epi-retinal membrane; PVD posterior vitreous detachment; RD retinal detachment; DM diabetes mellitus; DR diabetic retinopathy; NPDR non-proliferative diabetic retinopathy; PDR proliferative diabetic retinopathy; CSME clinically significant macular  edema; DME diabetic macular edema; dbh dot blot hemorrhages; CWS cotton wool spot; POAG primary open angle glaucoma; C/D cup-to-disc ratio; HVF humphrey visual field; GVF goldmann visual field; OCT optical coherence tomography; IOP intraocular pressure; BRVO Branch retinal vein occlusion; CRVO central retinal vein occlusion; CRAO central retinal artery occlusion; BRAO branch retinal artery occlusion; RT retinal tear; SB scleral buckle; PPV pars plana vitrectomy; VH Vitreous hemorrhage; PRP panretinal laser photocoagulation; IVK intravitreal kenalog; VMT vitreomacular traction; MH Macular hole;  NVD neovascularization of the disc; NVE neovascularization elsewhere; AREDS age related eye disease study; ARMD age related macular degeneration; POAG primary open angle glaucoma; EBMD epithelial/anterior basement membrane dystrophy; ACIOL anterior chamber intraocular lens; IOL intraocular lens; PCIOL posterior chamber intraocular lens; Phaco/IOL phacoemulsification with intraocular lens placement; [place] photorefractive keratectomy; LASIK laser assisted in situ keratomileusis; HTN hypertension; DM diabetes mellitus; COPD chronic obstructive pulmonary disease

## 2020-12-28 NOTE — Assessment & Plan Note (Signed)
OD with history of recurrences of CNVM and stable acuity now at 9-week interval after extension to 9 weeks from 8.  Repeat injection today and consider extension to 10-week follow-up interval

## 2020-12-28 NOTE — Assessment & Plan Note (Signed)
OS, no signs of recurrent CNVM

## 2020-12-29 ENCOUNTER — Encounter: Payer: Self-pay | Admitting: Orthopedic Surgery

## 2020-12-29 ENCOUNTER — Non-Acute Institutional Stay: Payer: Medicare Other | Admitting: Orthopedic Surgery

## 2020-12-29 DIAGNOSIS — R269 Unspecified abnormalities of gait and mobility: Secondary | ICD-10-CM

## 2020-12-29 DIAGNOSIS — I872 Venous insufficiency (chronic) (peripheral): Secondary | ICD-10-CM | POA: Diagnosis not present

## 2020-12-29 DIAGNOSIS — I10 Essential (primary) hypertension: Secondary | ICD-10-CM | POA: Diagnosis not present

## 2020-12-29 DIAGNOSIS — I480 Paroxysmal atrial fibrillation: Secondary | ICD-10-CM

## 2020-12-29 DIAGNOSIS — Z950 Presence of cardiac pacemaker: Secondary | ICD-10-CM | POA: Diagnosis not present

## 2020-12-29 DIAGNOSIS — E039 Hypothyroidism, unspecified: Secondary | ICD-10-CM

## 2020-12-29 DIAGNOSIS — M159 Polyosteoarthritis, unspecified: Secondary | ICD-10-CM

## 2020-12-29 DIAGNOSIS — N1832 Chronic kidney disease, stage 3b: Secondary | ICD-10-CM

## 2020-12-29 NOTE — Progress Notes (Signed)
Location:   [facility] Room Number: 025 Place of Service:  ALF [phone]) Provider:  Windell Moulding, NP  Mast, Man X, NP  Patient Care Team: Mast, Man X, NP as PCP - General (Internal Medicine) Debara Pickett Nadean Corwin, MD as PCP - Cardiology (Cardiology)  Extended Emergency Contact Information Primary Emergency Contact: Anastasia Fiedler, [place] Phone: [phone] Relation: Son Secondary Emergency Contact: [place] Phone: [phone] Relation: Friend  Code Status:  DNR Goals of care: Advanced Directive information Advanced Directives 12/29/2020  Does Patient Have a Medical Advance Directive? Yes  Type of Advance Directive [place]  Does patient want to make changes to medical advance directive? No - Patient declined  Copy of [place] in Chart? Yes - validated most recent copy scanned in chart (See row information)  Pre-existing out of facility DNR order (yellow form or pink MOST form) -     Chief Complaint  Patient presents with   Acute Visit    Lower leg edema   Health Maintenance    Zoster,2nd COVID booster, Flu    HPI:  Pt is a 87 y.o. female seen today for an acute visit for bilateral lower leg edema.   Nursing reports increased bilateral ankle edema within the past day. 10/17 she was seen for BLE edema. Furosemide increased to 20 mg daily. She denies weight gain, cough, chest pain or palpitations.Admits to shortness of breath with exertion, unchanged since swelling began. Potassium 3.9 12/21/2020. Remains on regular diet.   Afib- HR controlled with metoprolol, not on anticoagulation at this time HTN- BUN/creat 35/1.33 12/21/2020, controlled with lisinopril and metoprolol Pacemaker- past history of AVB and Mobitz II CKD- see above Hypothyroidism- TSH 2.16 04/2020, remains on levothyroxine Gait abnormality- using walker all the time, reports sitting more,  considering using w/c, no recent falls OA multiple joints- denies increased pain, remains on scheduled tylenol  Recent weights:  11/01- 142.8 lbs  10/01- 143.6 lbs  09/01- 139.8 lbs  Past Medical History:  Diagnosis Date   Breast cancer ([place])    Cardiac pacemaker in situ 04/01/2004   Original implant 04/01/04 Dr. Doreatha Lew Indications syncope with Mobitz 2 heart block  RV lead Guidant 4470 52 cm bipolar lead, SN [id number] atrial lead Guidant bipolar serial #[id number].  Medtronic EnPulse Model number [id number], SN PNB [id number]    Carotid bruit 10/25/2010   right   Diverticulosis    H/O syncope    Hearing loss in right ear 05/26/2011   Hiatal hernia    HTN (hypertension), benign    Hyperlipidemia    Melanoma of back ([place]) 2009   Mobitz II    Osteoporosis    Past Surgical History:  Procedure Laterality Date   BREAST BIOPSY  1960   Left   BREAST LUMPECTOMY  1960   left 2017   BREAST LUMPECTOMY WITH RADIOACTIVE SEED LOCALIZATION Left 09/10/2015   Procedure: BREAST LUMPECTOMY WITH RADIOACTIVE SEED LOCALIZATION;  Surgeon: Excell Seltzer, MD;  Location: [facility];  Service: General;  Laterality: Left;   CATARACT EXTRACTION Bilateral 2002   Dr. Katy Fitch   CATARACT EXTRACTION, BILATERAL  2001   INSERT / REPLACE / REMOVE PACEMAKER     LOM HEMOTYMPANUM THROAT & SINUS BX  1977   PACEMAKER GENERATOR CHANGE N/A 08/16/2012   Procedure: PACEMAKER GENERATOR CHANGE;  Surgeon: Deboraha Sprang, MD;  Location[facility] C[facility] LAB;  Service: Cardiovascular;  Laterality: N/A;   PACEMAKER INSERTION  2006   Medtronic DDDR [id number], SERIAL #  [id number]   surgery of ear      Allergies  Allergen Reactions   Adhesive [Tape] Itching and Rash    Please use "paper" tape   Codeine Nausea Only   Triamterene-Hctz Other (See Comments)    fatigue   Vasotec Diarrhea    Allergies as of 12/29/2020       Reactions   Adhesive [tape] Itching, Rash   Please use "paper" tape   Codeine Nausea Only   Triamterene-hctz Other (See  Comments)   fatigue   Vasotec Diarrhea        Medication List        Accurate as of December 29, 2020  3:58 PM. If you have any questions, ask your nurse or doctor.          STOP taking these medications    diclofenac Sodium 1 % Gel Commonly known as: VOLTAREN Stopped by: Yvonna Alanis, NP       TAKE these medications    acetaminophen 500 MG tablet Commonly known as: TYLENOL Take 500 mg by mouth every 6 (six) hours as needed. What changed: Another medication with the same name was removed. Continue taking this medication, and follow the directions you see here. Changed by: Yvonna Alanis, NP   beta carotene w/minerals tablet Take 1 tablet by mouth daily.   furosemide 20 MG tablet Commonly known as: LASIX Take 0.5 tablets (10 mg total) by mouth every other day.   levothyroxine 50 MCG tablet Commonly known as: SYNTHROID TAKE 1 TABLET ONCE DAILY.   lisinopril 20 MG tablet Commonly known as: ZESTRIL Take 1 tablet (20 mg total) by mouth daily.   loperamide 2 MG tablet Commonly known as: IMODIUM A-D Take 2 mg by mouth daily as needed for diarrhea or loose stools.   metoprolol succinate 25 MG 24 hr tablet Commonly known as: TOPROL-XL TAKE 1 TABLET ONCE DAILY.   potassium chloride SA 20 MEQ tablet Commonly known as: KLOR-CON Take 0.5 tablets (10 mEq total) by mouth every other day.   Vitamin D-3 25 MCG (1000 UT) Caps Take 1,000 Units by mouth daily.        Review of Systems  Constitutional:  Negative for activity change, appetite change, chills, fatigue and fever.  HENT:  Negative for congestion and trouble swallowing.   Eyes:  Positive for visual disturbance.  Respiratory:  Negative for cough, shortness of breath and wheezing.   Cardiovascular:  Positive for leg swelling. Negative for chest pain.  Gastrointestinal:  Negative for abdominal distention, abdominal pain, blood in stool, constipation, diarrhea, nausea and vomiting.  Genitourinary:  Negative for  dysuria, frequency and hematuria.  Musculoskeletal:  Positive for arthralgias and gait problem.  Skin: Negative.   Neurological:  Positive for weakness. Negative for dizziness and headaches.  Psychiatric/Behavioral:  Negative for dysphoric mood and sleep disturbance. The patient is not nervous/anxious.    Immunization History  Administered Date(s) Administered   Fluad Quad(high Dose 65+) 12/10/2018   Influenza Split 12/10/2012   Influenza, High Dose Seasonal PF 12/10/2019   Influenza-Unspecified 12/11/2013, 11/28/2014, 12/08/2015, 12/04/2016, 11/27/2017, 12/10/2019   Moderna SARS-COV2 Booster Vaccination 01/06/2020   Moderna Sars-Covid-2 Vaccination 03/03/2019, 03/31/2019   PFIZER(Purple Top)SARS-COV-2 Vaccination 07/27/2020   Pneumococcal Conjugate-13 01/06/2014, 01/08/2014   Pneumococcal Polysaccharide-23 04/20/2009   Tdap 07/03/2000, 05/02/2011   Zoster, Live 10/20/2009   Pertinent  Health Maintenance Due  Topic Date Due  INFLUENZA VACCINE  09/27/2020   DEXA SCAN  Addressed   Fall Risk 12/11/2019 01/30/2020 05/20/2020 06/17/2020 08/05/2020  Falls in the past year? 1 1 0 0 0  Was there an injury with Fall? 0 1 - 0 0  Fall Risk Category Calculator 1 2 - 0 0  Fall Risk Category Low Moderate - Low Low  Patient Fall Risk Level - - - Low fall risk Low fall risk  Patient at Risk for Falls Due to - - - - -   Functional Status Survey:    Vitals:   12/29/20 1545  BP: 140/73  Pulse: 60  Resp: 18  Temp: (!) 97.1 F (36.2 C)  SpO2: 97%  Weight: 142 lb 12.8 oz (64.8 kg)  Height: 5\' 3"  (1.6 m)   Body mass index is 25.3 kg/m. Physical Exam Vitals reviewed.  Constitutional:      General: She is not in acute distress. HENT:     Head: Normocephalic.  Eyes:     General:        Right eye: No discharge.        Left eye: No discharge.     Comments: Ectropion to right and left lower eyelids  Neck:     Vascular: No carotid bruit.  Cardiovascular:     Rate and Rhythm: Normal rate.  Rhythm irregular.     Pulses: Normal pulses.     Heart sounds: Murmur heard.  Pulmonary:     Effort: Pulmonary effort is normal. No respiratory distress.     Breath sounds: Normal breath sounds. No wheezing or rales.  Abdominal:     General: Bowel sounds are normal. There is no distension.     Palpations: Abdomen is soft.     Tenderness: There is no abdominal tenderness.  Musculoskeletal:     Cervical back: Normal range of motion.     Right lower leg: Edema present.     Left lower leg: Edema present.     Comments: 1+ pitting  Lymphadenopathy:     Cervical: No cervical adenopathy.  Skin:    General: Skin is warm and dry.     Capillary Refill: Capillary refill takes less than 2 seconds.     Comments: Left breast with orange appearance. BLE with brownish skin discoloration.   Neurological:     General: No focal deficit present.     Mental Status: She is alert and oriented to person, place, and time.     Motor: Weakness present.     Gait: Gait abnormal.  Psychiatric:        Mood and Affect: Mood normal.        Behavior: Behavior normal.    Labs reviewed: Recent Labs    04/29/20 0730 06/10/20 0835 09/06/20 0934  NA 141 144 141  K 3.9 4.4 4.1  CL 105 107 105  CO2 27 23 27   GLUCOSE 89 85 77  BUN 42* 32* 34*  CREATININE 1.46* 1.27* 1.23*  CALCIUM 8.9 9.0 9.0   Recent Labs    04/29/20 0730 06/10/20 0835 09/06/20 0934  AST 11 13 14   ALT 8 12 13   BILITOT 0.7 0.6 0.5  PROT 5.8* 6.0* 6.0*   No results for input(s): WBC, NEUTROABS, HGB, HCT, MCV, PLT in the last 8760 hours. Lab Results  Component Value Date   TSH 2.16 04/29/2020   No results found for: HGBA1C Lab Results  Component Value Date   CHOL 143 07/22/2019   HDL 62 07/22/2019  LDLCALC 64 07/22/2019   TRIG 91 07/22/2019   CHOLHDL 2.3 07/22/2019    Significant Diagnostic Results in last 30 days:  CUP PACEART REMOTE DEVICE CHECK  Result Date: 12/09/2020 Scheduled remote reviewed. Normal device  function.  13 AF events, all brief, CVR Known PAF. No OAC due to fall risk and low AF burden per prior report. Burden 0.6%. Next remote 91 days. LR  Intravitreal Injection, Pharmacologic Agent - OD - Right Eye  Result Date: 12/28/2020 Time Out 12/28/2020. 10:57 AM. Confirmed correct patient, procedure, site, and patient consented. Anesthesia Topical anesthesia was used. Anesthetic medications included [place] 3.5%. Procedure Preparation included 10% betadine to eyelids, 5% betadine to ocular surface, Ofloxacin . A 30 gauge needle was used. Injection: 2 mg aflibercept 2 MG/0.05ML   Route: Intravitreal, Site: Right Eye   NDC: [id number], Lot: [id number], Waste: 0 mL Post-op Post injection exam found visual acuity of at least counting fingers. The patient tolerated the procedure well. There were no complications. The patient received written and verbal post procedure care education. Post injection medications included ocuflox.   OCT, Retina - OU - Both Eyes  Result Date: 12/28/2020 Right Eye Quality was good. Scan locations included subfoveal. Central Foveal Thickness: 276. Progression has been stable. Findings include abnormal foveal contour, retinal drusen , subretinal scarring, subretinal hyper-reflective material, no IRF. Left Eye Quality was good. Scan locations included subfoveal. Central Foveal Thickness: 286. Progression has been stable. Findings include abnormal foveal contour, central retinal atrophy, outer retinal atrophy. Notes Visual acuity preserved OD with less subretinal fluid today at 9-week follow-up.  We will repeat injection today and maintain 9-week interval follow-up exams    Assessment/Plan 1. Edema of both lower extremities due to peripheral venous insufficiency - 1+ pitting edema- second time in one month - exam unremarkable, no rales - will increase lasix to 40 mg po x 2 days - cont KCL  2. PAF (paroxysmal atrial fibrillation) (HCC) - rate controlled with metoprolol - off  blood thinners due to advanced age  89. Essential hypertension - controlled with lisinopril and metoprolol  4. Pacemaker - AVB Mobitz II in past - s/p pacemaker  5. Stage 3b chronic kidney disease ([place]) - BUN/creat 35/1.33 12/21/2020  6. Acquired hypothyroidism - TSH normal - cont levothyroxine  7. Gait abnormality - ambulating with walker - starting to sit more - considering wheelchair  8. Generalized osteoarthritis of multiple sites - denies increased pain  - cont tylenol    Family/ staff Communication: plan discussed with patient and nurse  Labs/tests ordered:  none

## 2021-01-03 ENCOUNTER — Non-Acute Institutional Stay: Payer: Medicare Other | Admitting: Nurse Practitioner

## 2021-01-03 DIAGNOSIS — W19XXXA Unspecified fall, initial encounter: Secondary | ICD-10-CM | POA: Diagnosis not present

## 2021-01-03 DIAGNOSIS — M1A9XX1 Chronic gout, unspecified, with tophus (tophi): Secondary | ICD-10-CM

## 2021-01-03 DIAGNOSIS — M159 Polyosteoarthritis, unspecified: Secondary | ICD-10-CM | POA: Diagnosis not present

## 2021-01-03 DIAGNOSIS — I480 Paroxysmal atrial fibrillation: Secondary | ICD-10-CM

## 2021-01-03 DIAGNOSIS — R269 Unspecified abnormalities of gait and mobility: Secondary | ICD-10-CM | POA: Diagnosis not present

## 2021-01-03 DIAGNOSIS — I872 Venous insufficiency (chronic) (peripheral): Secondary | ICD-10-CM

## 2021-01-03 DIAGNOSIS — I10 Essential (primary) hypertension: Secondary | ICD-10-CM

## 2021-01-03 DIAGNOSIS — N1832 Chronic kidney disease, stage 3b: Secondary | ICD-10-CM

## 2021-01-03 DIAGNOSIS — E039 Hypothyroidism, unspecified: Secondary | ICD-10-CM

## 2021-01-03 NOTE — Assessment & Plan Note (Signed)
Bun/creat 35/1.3 12/21/20

## 2021-01-03 NOTE — Progress Notes (Signed)
Location:   [facility] Room Number: 916 Place of Service:  ALF (13) Provider: Lennie Odor Esthefany Herrig NP  Jamion Carter X, NP  Patient Care Team: Yareli Carthen X, NP as PCP - General (Internal Medicine) Debara Pickett Nadean Corwin, MD as PCP - Cardiology (Cardiology)  Extended Emergency Contact Information Primary Emergency Contact: Anastasia Fiedler, [place] Phone: [phone] Relation: Son Secondary Emergency Contact: [place] Phone: [phone] Relation: Friend  Code Status: DNR Goals of care: Advanced Directive information Advanced Directives 12/29/2020  Does Patient Have a Medical Advance Directive? Yes  Type of Advance Directive [place]  Does patient want to make changes to medical advance directive? No - Patient declined  Copy of [place] in Chart? Yes - validated most recent copy scanned in chart (See row information)  Pre-existing out of facility DNR order (yellow form or pink MOST form) -     Chief Complaint  Patient presents with   Acute Visit    Fall     HPI:  Pt is a 87 y.o. female seen today for an acute visit for fall 01/02/21 when the patient stood up and selide self to the floor, no apparent injury, denied dizziness, chest pain, pressure, palpitation, or focal weakness associated with her fall.   Edema BLE, R1+>L trace,  increased Furosemide to 40mg  qd 11/3-11/5, resumed 20mg  qd 01/02/21. Denied increased SOB, cough, or palpitation.              OA, multiple sites, takes Tylenol.               HTN, on Lisinopril,  Metoprolol. Bun/creat 35/1.3 12/21/20             Hypothyroidism, stable, on Levothyroxine 13mcg qd. TSH 2.16 04/29/20             CKD Bun/creat 35/1.3 12/21/20             Gout asymptomatic, uric acid 7.2 06/10/20             Gait abnormality, may need w/c due to increased frailty.              AVB Mobitz 2, s/p pacemaker.              PAF continue Metoprolol, avoid  anticoagulation      Past Medical History:  Diagnosis Date   Breast cancer [facility])    Cardiac pacemaker in situ 04/01/2004   Original implant 04/01/04 Dr. Doreatha Lew Indications syncope with Mobitz 2 heart block  RV lead Guidant 4470 52 cm bipolar lead, SN [id number] atrial lead Guidant bipolar serial #[id number].  Medtronic EnPulse Model number [id number], SN PNB [id number]    Carotid bruit 10/25/2010   right   Diverticulosis    H/O syncope    Hearing loss in right ear 05/26/2011   Hiatal hernia    HTN (hypertension), benign    Hyperlipidemia    Melanoma of back ([place]) 2009   Mobitz II    Osteoporosis    Past Surgical History:  Procedure Laterality Date   BREAST BIOPSY  1960   Left   BREAST LUMPECTOMY  1960   left 2017   BREAST LUMPECTOMY WITH RADIOACTIVE SEED LOCALIZATION Left 09/10/2015   Procedure: BREAST LUMPECTOMY WITH RADIOACTIVE SEED LOCALIZATION;  Surgeon: Excell Seltzer, MD;  Location: [facility];  Service: General;  Laterality: Left;   CATARACT EXTRACTION Bilateral 2002  Dr. Katy Fitch   CATARACT EXTRACTION, BILATERAL  2001   INSERT / REPLACE / REMOVE PACEMAKER     LOM HEMOTYMPANUM THROAT & SINUS BX  1977   PACEMAKER GENERATOR CHANGE N/A 08/16/2012   Procedure: PACEMAKER GENERATOR CHANGE;  Surgeon: Deboraha Sprang, MD;  Location: [facility] CATH LAB;  Service: Cardiovascular;  Laterality: N/A;   PACEMAKER INSERTION  2006   Medtronic DDDR [id number], SERIAL #  [id number]   surgery of ear      Allergies  Allergen Reactions   Adhesive [Tape] Itching and Rash    Please use "paper" tape   Codeine Nausea Only   Triamterene-Hctz Other (See Comments)    fatigue   Vasotec Diarrhea    Allergies as of 01/03/2021       Reactions   Adhesive [tape] Itching, Rash   Please use "paper" tape   Codeine Nausea Only   Triamterene-hctz Other (See Comments)   fatigue   Vasotec Diarrhea        Medication List        Accurate as of January 03, 2021 11:59 PM. If you have any questions, ask your nurse  or doctor.          acetaminophen 500 MG tablet Commonly known as: TYLENOL Take 500 mg by mouth every 6 (six) hours as needed.   beta carotene w/minerals tablet Take 1 tablet by mouth daily.   furosemide 20 MG tablet Commonly known as: LASIX Take 0.5 tablets (10 mg total) by mouth every other day.   levothyroxine 50 MCG tablet Commonly known as: SYNTHROID TAKE 1 TABLET ONCE DAILY.   lisinopril 20 MG tablet Commonly known as: ZESTRIL Take 1 tablet (20 mg total) by mouth daily.   loperamide 2 MG tablet Commonly known as: IMODIUM A-D Take 2 mg by mouth daily as needed for diarrhea or loose stools.   metoprolol succinate 25 MG 24 hr tablet Commonly known as: TOPROL-XL TAKE 1 TABLET ONCE DAILY.   potassium chloride SA 20 MEQ tablet Commonly known as: KLOR-CON Take 0.5 tablets (10 mEq total) by mouth every other day.   Vitamin D-3 25 MCG (1000 UT) Caps Take 1,000 Units by mouth daily.        Review of Systems  Constitutional:  Negative for fatigue and fever.  HENT:  Positive for hearing loss. Negative for congestion and voice change.        Hearing aids.   Eyes:  Negative for visual disturbance.       S/p inj, macular degeneration  Respiratory:  Positive for cough and shortness of breath.        DOE, chronic hacking cough.   Cardiovascular:  Positive for leg swelling.  Gastrointestinal:  Negative for abdominal pain and constipation.  Genitourinary:  Positive for frequency.       Every 2-3 hours at night  Musculoskeletal:  Positive for arthralgias and gait problem.  Skin:  Negative for color change.  Neurological:  Negative for speech difficulty, weakness and light-headedness.       Tingling numbness in hands.   Psychiatric/Behavioral:  Negative for behavioral problems and sleep disturbance. The patient is not nervous/anxious.    Immunization History  Administered Date(s) Administered   Fluad Quad(high Dose 65+) 12/10/2018   Influenza Split 12/10/2012    Influenza, High Dose Seasonal PF 12/10/2019   Influenza-Unspecified 12/11/2013, 11/28/2014, 12/08/2015, 12/04/2016, 11/27/2017, 12/10/2019   Moderna SARS-COV2 Booster Vaccination 01/06/2020   Moderna Sars-Covid-2 Vaccination 03/03/2019, 03/31/2019   PFIZER(Purple Top)SARS-COV-2 Vaccination 07/27/2020  Pneumococcal Conjugate-13 01/06/2014, 01/08/2014   Pneumococcal Polysaccharide-23 04/20/2009   Tdap 07/03/2000, 05/02/2011   Zoster, Live 10/20/2009   Pertinent  Health Maintenance Due  Topic Date Due   INFLUENZA VACCINE  09/27/2020   DEXA SCAN  Addressed   Fall Risk 12/11/2019 01/30/2020 05/20/2020 06/17/2020 08/05/2020  Falls in the past year? 1 1 0 0 0  Was there an injury with Fall? 0 1 - 0 0  Fall Risk Category Calculator 1 2 - 0 0  Fall Risk Category Low Moderate - Low Low  Patient Fall Risk Level - - - Low fall risk Low fall risk  Patient at Risk for Falls Due to - - - - -   Functional Status Survey:    Vitals:   01/03/21 1145  BP: 138/78  Pulse: 64  Resp: 20  Temp: (!) 97.3 F (36.3 C)  SpO2: 96%   There is no height or weight on file to calculate BMI. Physical Exam Vitals and nursing note reviewed.  Constitutional:      Appearance: Normal appearance.  HENT:     Head: Normocephalic and atraumatic.     Mouth/Throat:     Mouth: Mucous membranes are moist.  Eyes:     Extraocular Movements: Extraocular movements intact.     Conjunctiva/sclera: Conjunctivae normal.     Pupils: Pupils are equal, round, and reactive to light.     Comments: Mild ectropion R+L lower eyelids  Cardiovascular:     Rate and Rhythm: Normal rate and regular rhythm.     Heart sounds: Murmur heard.  Pulmonary:     Effort: Pulmonary effort is normal.     Breath sounds: No rhonchi or rales.  Abdominal:     General: Bowel sounds are normal.     Palpations: Abdomen is soft.     Tenderness: There is no abdominal tenderness.  Musculoskeletal:     Cervical back: Normal range of motion and neck  supple.     Right lower leg: Edema present.     Left lower leg: Edema present.     Comments: Fullness left popliteal area. Edema 1+ RLE, trace LLE.  Dorsalis pedis pulses present.   Skin:    General: Skin is warm and dry.     Comments: Mild chronic venous insufficiency skin changes BLE. A golf ball sized, hard, irregular, orange peel skin appearance left breast.  Opted out workup and tx.   Neurological:     General: No focal deficit present.     Mental Status: She is alert and oriented to person, place, and time. Mental status is at baseline.     Motor: No weakness.     Coordination: Coordination normal.     Gait: Gait abnormal.  Psychiatric:        Mood and Affect: Mood normal.        Behavior: Behavior normal.        Thought Content: Thought content normal.        Judgment: Judgment normal.    Labs reviewed: Recent Labs    04/29/20 0730 06/10/20 0835 09/06/20 0934 12/21/20 0000  NA 141 144 141 140  K 3.9 4.4 4.1 3.9  CL 105 107 105 102  CO2 27 23 27  25*  GLUCOSE 89 85 77  --   BUN 42* 32* 34* 35*  CREATININE 1.46* 1.27* 1.23* 1.3*  CALCIUM 8.9 9.0 9.0  --    Recent Labs    04/29/20 0730 06/10/20 0835 09/06/20 0934 12/21/20 0000  AST 11 13 14  12*  ALT 8 12 13 19   ALKPHOS  --   --   --  98  BILITOT 0.7 0.6 0.5  --   PROT 5.8* 6.0* 6.0*  --   ALBUMIN  --   --   --  3.3*   Recent Labs    12/21/20 0000  WBC 8.5  NEUTROABS 5,763.00  HGB 13.0  HCT 39  PLT 192   Lab Results  Component Value Date   TSH 2.16 04/29/2020   No results found for: HGBA1C Lab Results  Component Value Date   CHOL 143 07/22/2019   HDL 62 07/22/2019   LDLCALC 64 07/22/2019   TRIG 91 07/22/2019   CHOLHDL 2.3 07/22/2019    Significant Diagnostic Results in last 30 days:  CUP PACEART REMOTE DEVICE CHECK  Result Date: 12/09/2020 Scheduled remote reviewed. Normal device function.  13 AF events, all brief, CVR Known PAF. No OAC due to fall risk and low AF burden per prior report.  Burden 0.6%. Next remote 91 days. LR  Intravitreal Injection, Pharmacologic Agent - OD - Right Eye  Result Date: 12/28/2020 Time Out 12/28/2020. 10:57 AM. Confirmed correct patient, procedure, site, and patient consented. Anesthesia Topical anesthesia was used. Anesthetic medications included Akten 3.5%. Procedure Preparation included 10% betadine to eyelids, 5% betadine to ocular surface, Ofloxacin . A 30 gauge needle was used. Injection: 2 mg aflibercept 2 MG/0.05ML   Route: Intravitreal, Site: Right Eye   NDC: [id number], Lot: [id number], Waste: 0 mL Post-op Post injection exam found visual acuity of at least counting fingers. The patient tolerated the procedure well. There were no complications. The patient received written and verbal post procedure care education. Post injection medications included ocuflox.   OCT, Retina - OU - Both Eyes  Result Date: 12/28/2020 Right Eye Quality was good. Scan locations included subfoveal. Central Foveal Thickness: 276. Progression has been stable. Findings include abnormal foveal contour, retinal drusen , subretinal scarring, subretinal hyper-reflective material, no IRF. Left Eye Quality was good. Scan locations included subfoveal. Central Foveal Thickness: 286. Progression has been stable. Findings include abnormal foveal contour, central retinal atrophy, outer retinal atrophy. Notes Visual acuity preserved OD with less subretinal fluid today at 9-week follow-up.  We will repeat injection today and maintain 9-week interval follow-up exams    Assessment/Plan: Fall Unsteady gait, increased frailty, needs w/c for mobility  Gait abnormality Pt to eval and tx, may need w/c for mobility 2/2 increased frailty.   Edema of both lower extremities due to peripheral venous insufficiency Edema BLE, R1+>L trace,  increased Furosemide to 40mg  qd 11/3-11/5, resumed 20mg  qd 01/02/21. Denied increased SOB, cough, or palpitation  Generalized osteoarthritis of multiple  sites multiple sites, takes Tylenol.   Essential hypertension Loose control bp, on Lisinopril,  Metoprolol. Bun/creat 35/1.3 12/21/20  Hypothyroidism stable, on Levothyroxine 60mcg qd. TSH 2.16 04/29/20  CKD (chronic kidney disease) stage 3, GFR 30-59 ml/min (HCC) Bun/creat 35/1.3 12/21/20  Gout asymptomatic, uric acid 7.2 06/10/20  PAF (paroxysmal atrial fibrillation) (HCC) continue Metoprolol, avoid anticoagulation    Family/ staff Communication: plan of care reviewed with the patient and charge nurse.   Labs/tests ordered:  none  Time spend 40 minutes.

## 2021-01-03 NOTE — Assessment & Plan Note (Signed)
Unsteady gait, increased frailty, needs w/c for mobility

## 2021-01-03 NOTE — Assessment & Plan Note (Signed)
stable, on Levothyroxine 70mcg qd. TSH 2.16 04/29/20

## 2021-01-03 NOTE — Assessment & Plan Note (Signed)
asymptomatic, uric acid 7.2 06/10/20

## 2021-01-03 NOTE — Assessment & Plan Note (Signed)
continue Metoprolol, avoid anticoagulation

## 2021-01-03 NOTE — Assessment & Plan Note (Signed)
Loose control bp, on Lisinopril,  Metoprolol. Bun/creat 35/1.3 12/21/20

## 2021-01-03 NOTE — Assessment & Plan Note (Signed)
Edema BLE, R1+>L trace,  increased Furosemide to 40mg  qd 11/3-11/5, resumed 20mg  qd 01/02/21. Denied increased SOB, cough, or palpitation

## 2021-01-03 NOTE — Assessment & Plan Note (Signed)
Pt to eval and tx, may need w/c for mobility 2/2 increased frailty.

## 2021-01-03 NOTE — Assessment & Plan Note (Signed)
multiple sites, takes Tylenol  

## 2021-01-04 ENCOUNTER — Encounter: Payer: Self-pay | Admitting: Nurse Practitioner

## 2021-01-07 ENCOUNTER — Encounter: Payer: Self-pay | Admitting: Nurse Practitioner

## 2021-01-07 ENCOUNTER — Non-Acute Institutional Stay: Payer: Medicare Other | Admitting: Nurse Practitioner

## 2021-01-07 DIAGNOSIS — Z950 Presence of cardiac pacemaker: Secondary | ICD-10-CM | POA: Diagnosis not present

## 2021-01-07 DIAGNOSIS — I480 Paroxysmal atrial fibrillation: Secondary | ICD-10-CM | POA: Diagnosis not present

## 2021-01-07 DIAGNOSIS — M1A9XX1 Chronic gout, unspecified, with tophus (tophi): Secondary | ICD-10-CM

## 2021-01-07 DIAGNOSIS — M159 Polyosteoarthritis, unspecified: Secondary | ICD-10-CM

## 2021-01-07 DIAGNOSIS — E039 Hypothyroidism, unspecified: Secondary | ICD-10-CM

## 2021-01-07 DIAGNOSIS — R269 Unspecified abnormalities of gait and mobility: Secondary | ICD-10-CM

## 2021-01-07 DIAGNOSIS — I872 Venous insufficiency (chronic) (peripheral): Secondary | ICD-10-CM

## 2021-01-07 DIAGNOSIS — I10 Essential (primary) hypertension: Secondary | ICD-10-CM

## 2021-01-07 DIAGNOSIS — N1832 Chronic kidney disease, stage 3b: Secondary | ICD-10-CM

## 2021-01-07 NOTE — Assessment & Plan Note (Signed)
stable, on Levothyroxine 70mcg qd. TSH 2.16 04/29/20

## 2021-01-07 NOTE — Assessment & Plan Note (Signed)
Blood pressure is controlled, on Lisinopril,  Metoprolol. Bun/creat 35/1.3 12/21/20

## 2021-01-07 NOTE — Progress Notes (Signed)
Location:   [facility] Room Number: 623 Place of Service:  ALF (13) Provider: Lennie Odor Nyle Limb NP  Anuhea Gassner X, NP  Patient Care Team: Bejamin Hackbart X, NP as PCP - General (Internal Medicine) Debara Pickett Nadean Corwin, MD as PCP - Cardiology (Cardiology)  Extended Emergency Contact Information Primary Emergency Contact: Anastasia Fiedler, [place] Phone: [phone] Relation: Son Secondary Emergency Contact: [place] Phone: [phone] Relation: Friend  Code Status:  DNR Goals of care: Advanced Directive information Advanced Directives 01/07/2021  Does Patient Have a Medical Advance Directive? Yes  Type of Advance Directive [place]  Does patient want to make changes to medical advance directive? No - Patient declined  Copy of [place] in Chart? Yes - validated most recent copy scanned in chart (See row information)  Pre-existing out of facility DNR order (yellow form or pink MOST form) -     Chief Complaint  Patient presents with   Medical Management of Chronic Issues   Quality Metric Gaps    Shingrix     HPI:  Pt is a 87 y.o. female seen today for medical management of chronic diseases.    Edema BLE, R>L takes Furosemide.             OA, multiple sites, takes Tylenol.               HTN, on Lisinopril,  Metoprolol. Bun/creat 35/1.3 12/21/20             Hypothyroidism, stable, on Levothyroxine 7mcg qd. TSH 2.16 04/29/20             CKD Bun/creat 35/1.3 12/21/20             Gout asymptomatic, uric acid 7.2 06/10/20             Gait abnormality, need w/c due to increased frailty.              AVB Mobitz 2, s/p pacemaker.              PAF continue Metoprolol, avoid anticoagulation    Past Medical History:  Diagnosis Date   Breast cancer [facility])    Cardiac pacemaker in situ 04/01/2004   Original implant 04/01/04 Dr. Doreatha Lew Indications syncope with Mobitz 2 heart block  RV lead Guidant 4470  52 cm bipolar lead, SN [id number] atrial lead Guidant bipolar serial #[id number].  Medtronic EnPulse Model number [id number], SN PNB [id number]    Carotid bruit 10/25/2010   right   Diverticulosis    H/O syncope    Hearing loss in right ear 05/26/2011   Hiatal hernia    HTN (hypertension), benign    Hyperlipidemia    Melanoma of back ([place]) 2009   Mobitz II    Osteoporosis    Past Surgical History:  Procedure Laterality Date   BREAST BIOPSY  1960   Left   BREAST LUMPECTOMY  1960   left 2017   BREAST LUMPECTOMY WITH RADIOACTIVE SEED LOCALIZATION Left 09/10/2015   Procedure: BREAST LUMPECTOMY WITH RADIOACTIVE SEED LOCALIZATION;  Surgeon: Excell Seltzer, MD;  Location: [place];  Service: General;  Laterality: Left;   CATARACT EXTRACTION Bilateral 2002   Dr. Katy Fitch   CATARACT EXTRACTION, BILATERAL  2001   INSERT / REPLACE / REMOVE PACEMAKER     LOM HEMOTYMPANUM THROAT & SINUS BX  1977   PACEMAKER GENERATOR CHANGE N/A 08/16/2012  Procedure: PACEMAKER GENERATOR CHANGE;  Surgeon: Deboraha Sprang, MD;  Location: [facility] CATH LAB;  Service: Cardiovascular;  Laterality: N/A;   PACEMAKER INSERTION  2006   Medtronic DDDR [id number], SERIAL #  [id number]   surgery of ear      Allergies  Allergen Reactions   Adhesive [Tape] Itching and Rash    Please use "paper" tape   Codeine Nausea Only   Triamterene-Hctz Other (See Comments)    fatigue   Vasotec Diarrhea    Allergies as of 01/07/2021       Reactions   Adhesive [tape] Itching, Rash   Please use "paper" tape   Codeine Nausea Only   Triamterene-hctz Other (See Comments)   fatigue   Vasotec Diarrhea        Medication List        Accurate as of January 07, 2021 11:59 PM. If you have any questions, ask your nurse or doctor.          acetaminophen 500 MG tablet Commonly known as: TYLENOL Take 500 mg by mouth every 6 (six) hours as needed.   beta carotene w/minerals tablet Take 1 tablet by mouth daily.   furosemide 20 MG  tablet Commonly known as: LASIX Take 20 mg by mouth daily. What changed: Another medication with the same name was removed. Continue taking this medication, and follow the directions you see here. Changed by: Adrianne Shackleton X Jaeleigh Monaco, NP   levothyroxine 50 MCG tablet Commonly known as: SYNTHROID TAKE 1 TABLET ONCE DAILY.   lisinopril 20 MG tablet Commonly known as: ZESTRIL Take 1 tablet (20 mg total) by mouth daily.   loperamide 2 MG tablet Commonly known as: IMODIUM A-D Take 2 mg by mouth daily as needed for diarrhea or loose stools.   metoprolol succinate 25 MG 24 hr tablet Commonly known as: TOPROL-XL TAKE 1 TABLET ONCE DAILY.   potassium chloride SA 20 MEQ tablet Commonly known as: KLOR-CON Take 0.5 tablets (10 mEq total) by mouth every other day.   Vitamin D-3 25 MCG (1000 UT) Caps Take 1,000 Units by mouth daily.        Review of Systems  Constitutional:  Negative for fatigue and fever.  HENT:  Positive for hearing loss. Negative for congestion and voice change.        Hearing aids.   Eyes:  Negative for visual disturbance.       S/p inj, macular degeneration  Respiratory:  Positive for cough and shortness of breath.        DOE, chronic hacking cough.   Cardiovascular:  Positive for leg swelling.  Gastrointestinal:  Negative for abdominal pain and constipation.  Genitourinary:  Positive for frequency.       Every 2-3 hours at night  Musculoskeletal:  Positive for arthralgias and gait problem.  Skin:  Negative for color change.  Neurological:  Negative for weakness and light-headedness.       Tingling numbness in hands.   Psychiatric/Behavioral:  Negative for behavioral problems and sleep disturbance. The patient is not nervous/anxious.    Immunization History  Administered Date(s) Administered   Fluad Quad(high Dose 65+) 12/10/2018   Influenza Split 12/10/2012   Influenza, High Dose Seasonal PF 12/10/2019   Influenza-Unspecified 12/11/2013, 11/28/2014, 12/08/2015,  12/04/2016, 11/27/2017, 12/10/2019, 12/16/2020   Moderna SARS-COV2 Booster Vaccination 01/06/2020   Moderna Sars-Covid-2 Vaccination 03/03/2019, 03/31/2019   PFIZER(Purple Top)SARS-COV-2 Vaccination 07/27/2020, 11/16/2020   Pneumococcal Conjugate-13 01/06/2014, 01/08/2014   Pneumococcal Polysaccharide-23 04/20/2009   Tdap 07/03/2000, 05/02/2011  Zoster, Live 10/20/2009   Pertinent  Health Maintenance Due  Topic Date Due   INFLUENZA VACCINE  Completed   DEXA SCAN  Addressed   Fall Risk 12/11/2019 01/30/2020 05/20/2020 06/17/2020 08/05/2020  Falls in the past year? 1 1 0 0 0  Was there an injury with Fall? 0 1 - 0 0  Fall Risk Category Calculator 1 2 - 0 0  Fall Risk Category Low Moderate - Low Low  Patient Fall Risk Level - - - Low fall risk Low fall risk  Patient at Risk for Falls Due to - - - - -   Functional Status Survey:    Vitals:   01/07/21 1458  BP: 140/72  Pulse: 65  Resp: 20  Temp: 97.6 F (36.4 C)  SpO2: 96%  Weight: 142 lb 12.8 oz (64.8 kg)  Height: 5\' 3"  (1.6 m)   Body mass index is 25.3 kg/m. Physical Exam Vitals and nursing note reviewed.  Constitutional:      Appearance: Normal appearance.  HENT:     Head: Normocephalic and atraumatic.     Mouth/Throat:     Mouth: Mucous membranes are moist.  Eyes:     Extraocular Movements: Extraocular movements intact.     Conjunctiva/sclera: Conjunctivae normal.     Pupils: Pupils are equal, round, and reactive to light.     Comments: Mild ectropion R+L lower eyelids  Cardiovascular:     Rate and Rhythm: Normal rate and regular rhythm.     Heart sounds: Murmur heard.  Pulmonary:     Effort: Pulmonary effort is normal.     Breath sounds: No rhonchi or rales.  Abdominal:     General: Bowel sounds are normal.     Palpations: Abdomen is soft.     Tenderness: There is no abdominal tenderness.  Musculoskeletal:     Cervical back: Normal range of motion and neck supple.     Right lower leg: Edema present.      Left lower leg: Edema present.     Comments: Fullness left popliteal area. Edema BLE mostly in the right ankle.   Skin:    General: Skin is warm and dry.     Comments: Mild chronic venous insufficiency skin changes BLE. A golf ball sized, hard, irregular, orange peel skin appearance left breast.  Opted out workup and tx.   Neurological:     General: No focal deficit present.     Mental Status: She is alert and oriented to person, place, and time. Mental status is at baseline.     Motor: No weakness.     Coordination: Coordination normal.     Gait: Gait abnormal.  Psychiatric:        Mood and Affect: Mood normal.        Behavior: Behavior normal.        Thought Content: Thought content normal.        Judgment: Judgment normal.    Labs reviewed: Recent Labs    04/29/20 0730 06/10/20 0835 09/06/20 0934 12/21/20 0000  NA 141 144 141 140  K 3.9 4.4 4.1 3.9  CL 105 107 105 102  CO2 27 23 27  25*  GLUCOSE 89 85 77  --   BUN 42* 32* 34* 35*  CREATININE 1.46* 1.27* 1.23* 1.3*  CALCIUM 8.9 9.0 9.0  --    Recent Labs    04/29/20 0730 06/10/20 0835 09/06/20 0934 12/21/20 0000  AST 11 13 14  12*  ALT 8 12 13  19  ALKPHOS  --   --   --  98  BILITOT 0.7 0.6 0.5  --   PROT 5.8* 6.0* 6.0*  --   ALBUMIN  --   --   --  3.3*   Recent Labs    12/21/20 0000  WBC 8.5  NEUTROABS 5,763.00  HGB 13.0  HCT 39  PLT 192   Lab Results  Component Value Date   TSH 2.16 04/29/2020   No results found for: HGBA1C Lab Results  Component Value Date   CHOL 143 07/22/2019   HDL 62 07/22/2019   LDLCALC 64 07/22/2019   TRIG 91 07/22/2019   CHOLHDL 2.3 07/22/2019    Significant Diagnostic Results in last 30 days:  Intravitreal Injection, Pharmacologic Agent - OD - Right Eye  Result Date: 12/28/2020 Time Out 12/28/2020. 10:57 AM. Confirmed correct patient, procedure, site, and patient consented. Anesthesia Topical anesthesia was used. Anesthetic medications included Akten 3.5%. Procedure  Preparation included 10% betadine to eyelids, 5% betadine to ocular surface, Ofloxacin . A 30 gauge needle was used. Injection: 2 mg aflibercept 2 MG/0.05ML   Route: Intravitreal, Site: Right Eye   NDC: [id number], Lot: [id number], Waste: 0 mL Post-op Post injection exam found visual acuity of at least counting fingers. The patient tolerated the procedure well. There were no complications. The patient received written and verbal post procedure care education. Post injection medications included ocuflox.   OCT, Retina - OU - Both Eyes  Result Date: 12/28/2020 Right Eye Quality was good. Scan locations included subfoveal. Central Foveal Thickness: 276. Progression has been stable. Findings include abnormal foveal contour, retinal drusen , subretinal scarring, subretinal hyper-reflective material, no IRF. Left Eye Quality was good. Scan locations included subfoveal. Central Foveal Thickness: 286. Progression has been stable. Findings include abnormal foveal contour, central retinal atrophy, outer retinal atrophy. Notes Visual acuity preserved OD with less subretinal fluid today at 9-week follow-up.  We will repeat injection today and maintain 9-week interval follow-up exams    Assessment/Plan  Gait abnormality need w/c due to increased frailty.   Pacemaker  AVB Mobitz 2, s/p pacemaker.   PAF (paroxysmal atrial fibrillation) (HCC) Heart rate is in control,  continue Metoprolol, avoid anticoagulation    Edema of both lower extremities due to peripheral venous insufficiency BLE, R>L takes Furosemide.   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   Essential hypertension Blood pressure is controlled, on Lisinopril,  Metoprolol. Bun/creat 35/1.3 12/21/20  Hypothyroidism  stable, on Levothyroxine 27mcg qd. TSH 2.16 04/29/20  CKD (chronic kidney disease) stage 3, GFR 30-59 ml/min (HCC) Bun/creat 35/1.3 12/21/20  Gout asymptomatic, uric acid 7.2  06/10/20   Family/ staff Communication: plan of care reviewed with the patient and charge nurse.   Labs/tests ordered:  none  Time spend 40 minutes.

## 2021-01-07 NOTE — Assessment & Plan Note (Signed)
asymptomatic, uric acid 7.2 06/10/20

## 2021-01-07 NOTE — Assessment & Plan Note (Signed)
need w/c due to increased frailty.

## 2021-01-07 NOTE — Assessment & Plan Note (Signed)
Heart rate is in control, continue Metoprolol, avoid anticoagulation   

## 2021-01-07 NOTE — Assessment & Plan Note (Signed)
Bun/creat 35/1.3 12/21/20

## 2021-01-07 NOTE — Assessment & Plan Note (Signed)
multiple sites, takes Tylenol  

## 2021-01-07 NOTE — Assessment & Plan Note (Signed)
AVB Mobitz 2, s/p pacemaker.

## 2021-01-07 NOTE — Assessment & Plan Note (Signed)
BLE, R>L takes Furosemide.

## 2021-01-10 ENCOUNTER — Encounter: Payer: Self-pay | Admitting: Nurse Practitioner

## 2021-01-24 ENCOUNTER — Inpatient Hospital Stay (HOSPITAL_COMMUNITY)
Admission: EM | Admit: 2021-01-24 | Discharge: 2021-01-27 | DRG: 951 | Disposition: A | Discharge status: Skilled Nursing Facility | Payer: Medicare Other | Attending: Family Medicine | Admitting: Family Medicine

## 2021-01-24 ENCOUNTER — Emergency Department (HOSPITAL_COMMUNITY): Payer: Medicare Other

## 2021-01-24 ENCOUNTER — Other Ambulatory Visit: Payer: Self-pay

## 2021-01-24 ENCOUNTER — Non-Acute Institutional Stay: Payer: Medicare Other | Admitting: Nurse Practitioner

## 2021-01-24 ENCOUNTER — Encounter: Payer: Self-pay | Admitting: Nurse Practitioner

## 2021-01-24 ENCOUNTER — Encounter (HOSPITAL_COMMUNITY): Payer: Self-pay

## 2021-01-24 DIAGNOSIS — E785 Hyperlipidemia, unspecified: Secondary | ICD-10-CM | POA: Diagnosis present

## 2021-01-24 DIAGNOSIS — N179 Acute kidney failure, unspecified: Secondary | ICD-10-CM | POA: Insufficient documentation

## 2021-01-24 DIAGNOSIS — R0602 Shortness of breath: Secondary | ICD-10-CM | POA: Diagnosis not present

## 2021-01-24 DIAGNOSIS — M159 Polyosteoarthritis, unspecified: Secondary | ICD-10-CM

## 2021-01-24 DIAGNOSIS — Z8673 Personal history of transient ischemic attack (TIA), and cerebral infarction without residual deficits: Secondary | ICD-10-CM

## 2021-01-24 DIAGNOSIS — I10 Essential (primary) hypertension: Secondary | ICD-10-CM

## 2021-01-24 DIAGNOSIS — N1832 Chronic kidney disease, stage 3b: Secondary | ICD-10-CM | POA: Diagnosis present

## 2021-01-24 DIAGNOSIS — E039 Hypothyroidism, unspecified: Secondary | ICD-10-CM

## 2021-01-24 DIAGNOSIS — K7200 Acute and subacute hepatic failure without coma: Secondary | ICD-10-CM | POA: Diagnosis present

## 2021-01-24 DIAGNOSIS — I441 Atrioventricular block, second degree: Secondary | ICD-10-CM | POA: Diagnosis present

## 2021-01-24 DIAGNOSIS — Z85820 Personal history of malignant melanoma of skin: Secondary | ICD-10-CM

## 2021-01-24 DIAGNOSIS — Z950 Presence of cardiac pacemaker: Secondary | ICD-10-CM

## 2021-01-24 DIAGNOSIS — Z79899 Other long term (current) drug therapy: Secondary | ICD-10-CM

## 2021-01-24 DIAGNOSIS — M1A9XX1 Chronic gout, unspecified, with tophus (tophi): Secondary | ICD-10-CM

## 2021-01-24 DIAGNOSIS — Z66 Do not resuscitate: Secondary | ICD-10-CM | POA: Diagnosis present

## 2021-01-24 DIAGNOSIS — Z823 Family history of stroke: Secondary | ICD-10-CM

## 2021-01-24 DIAGNOSIS — R269 Unspecified abnormalities of gait and mobility: Secondary | ICD-10-CM

## 2021-01-24 DIAGNOSIS — I872 Venous insufficiency (chronic) (peripheral): Secondary | ICD-10-CM | POA: Diagnosis not present

## 2021-01-24 DIAGNOSIS — Z20822 Contact with and (suspected) exposure to covid-19: Secondary | ICD-10-CM | POA: Diagnosis present

## 2021-01-24 DIAGNOSIS — Z79890 Hormone replacement therapy (postmenopausal): Secondary | ICD-10-CM

## 2021-01-24 DIAGNOSIS — Z87891 Personal history of nicotine dependence: Secondary | ICD-10-CM

## 2021-01-24 DIAGNOSIS — Z853 Personal history of malignant neoplasm of breast: Secondary | ICD-10-CM

## 2021-01-24 DIAGNOSIS — E875 Hyperkalemia: Secondary | ICD-10-CM | POA: Diagnosis present

## 2021-01-24 DIAGNOSIS — R778 Other specified abnormalities of plasma proteins: Secondary | ICD-10-CM

## 2021-01-24 DIAGNOSIS — R52 Pain, unspecified: Secondary | ICD-10-CM

## 2021-01-24 DIAGNOSIS — D72829 Elevated white blood cell count, unspecified: Secondary | ICD-10-CM

## 2021-01-24 DIAGNOSIS — Z91048 Other nonmedicinal substance allergy status: Secondary | ICD-10-CM

## 2021-01-24 DIAGNOSIS — Z515 Encounter for palliative care: Principal | ICD-10-CM

## 2021-01-24 DIAGNOSIS — H9191 Unspecified hearing loss, right ear: Secondary | ICD-10-CM | POA: Diagnosis present

## 2021-01-24 DIAGNOSIS — A419 Sepsis, unspecified organism: Secondary | ICD-10-CM | POA: Diagnosis present

## 2021-01-24 DIAGNOSIS — I5031 Acute diastolic (congestive) heart failure: Secondary | ICD-10-CM | POA: Diagnosis present

## 2021-01-24 DIAGNOSIS — J9601 Acute respiratory failure with hypoxia: Secondary | ICD-10-CM | POA: Diagnosis present

## 2021-01-24 DIAGNOSIS — E8770 Fluid overload, unspecified: Secondary | ICD-10-CM | POA: Diagnosis present

## 2021-01-24 DIAGNOSIS — M810 Age-related osteoporosis without current pathological fracture: Secondary | ICD-10-CM | POA: Diagnosis present

## 2021-01-24 DIAGNOSIS — R188 Other ascites: Secondary | ICD-10-CM | POA: Diagnosis present

## 2021-01-24 DIAGNOSIS — E872 Acidosis, unspecified: Secondary | ICD-10-CM | POA: Diagnosis present

## 2021-01-24 DIAGNOSIS — J189 Pneumonia, unspecified organism: Secondary | ICD-10-CM | POA: Diagnosis present

## 2021-01-24 DIAGNOSIS — I480 Paroxysmal atrial fibrillation: Secondary | ICD-10-CM

## 2021-01-24 DIAGNOSIS — Z885 Allergy status to narcotic agent status: Secondary | ICD-10-CM

## 2021-01-24 DIAGNOSIS — Z888 Allergy status to other drugs, medicaments and biological substances status: Secondary | ICD-10-CM

## 2021-01-24 DIAGNOSIS — I130 Hypertensive heart and chronic kidney disease with heart failure and stage 1 through stage 4 chronic kidney disease, or unspecified chronic kidney disease: Secondary | ICD-10-CM | POA: Diagnosis present

## 2021-01-24 DIAGNOSIS — M7989 Other specified soft tissue disorders: Secondary | ICD-10-CM | POA: Diagnosis present

## 2021-01-24 DIAGNOSIS — Z841 Family history of disorders of kidney and ureter: Secondary | ICD-10-CM

## 2021-01-24 LAB — COMPREHENSIVE METABOLIC PANEL
ALT: 229 U/L — ABNORMAL HIGH (ref 0–44)
AST: 182 U/L — ABNORMAL HIGH (ref 15–41)
Albumin: 3.3 g/dL — ABNORMAL LOW (ref 3.5–5.0)
Alkaline Phosphatase: 147 U/L — ABNORMAL HIGH (ref 38–126)
Anion gap: 19 — ABNORMAL HIGH (ref 5–15)
BUN: 99 mg/dL — ABNORMAL HIGH (ref 8–23)
CO2: 17 mmol/L — ABNORMAL LOW (ref 22–32)
Calcium: 8.9 mg/dL (ref 8.9–10.3)
Chloride: 100 mmol/L (ref 98–111)
Creatinine, Ser: 3.34 mg/dL — ABNORMAL HIGH (ref 0.44–1.00)
GFR, Estimated: 12 mL/min — ABNORMAL LOW (ref >60)
Glucose, Bld: 101 mg/dL — ABNORMAL HIGH (ref 70–99)
Potassium: 5.8 mmol/L — ABNORMAL HIGH (ref 3.5–5.1)
Sodium: 136 mmol/L (ref 135–145)
Total Bilirubin: 2.5 mg/dL — ABNORMAL HIGH (ref 0.3–1.2)
Total Protein: 6.8 g/dL (ref 6.5–8.1)

## 2021-01-24 LAB — CBC WITH DIFFERENTIAL/PLATELET
Abs Immature Granulocytes: 0.44 10*3/uL — ABNORMAL HIGH (ref 0.00–0.07)
Basophils Absolute: 0.1 10*3/uL (ref 0.0–0.1)
Basophils Relative: 0 %
Eosinophils Absolute: 0 10*3/uL (ref 0.0–0.5)
Eosinophils Relative: 0 %
HCT: 45.4 % (ref 36.0–46.0)
Hemoglobin: 14.9 g/dL (ref 12.0–15.0)
Immature Granulocytes: 2 %
Lymphocytes Relative: 5 %
Lymphs Abs: 1.3 10*3/uL (ref 0.7–4.0)
MCH: 28.9 pg (ref 26.0–34.0)
MCHC: 32.8 g/dL (ref 30.0–36.0)
MCV: 88 fL (ref 80.0–100.0)
Monocytes Absolute: 1.9 10*3/uL — ABNORMAL HIGH (ref 0.1–1.0)
Monocytes Relative: 7 %
Neutro Abs: 24.3 10*3/uL — ABNORMAL HIGH (ref 1.7–7.7)
Neutrophils Relative %: 86 %
Platelets: 200 10*3/uL (ref 150–400)
RBC: 5.16 MIL/uL — ABNORMAL HIGH (ref 3.87–5.11)
RDW: 15.2 % (ref 11.5–15.5)
WBC: 28 10*3/uL — ABNORMAL HIGH (ref 4.0–10.5)
nRBC: 0.6 % — ABNORMAL HIGH (ref 0.0–0.2)

## 2021-01-24 LAB — RESP PANEL BY RT-PCR (FLU A&B, COVID) ARPGX2
Influenza A by PCR: NEGATIVE
Influenza B by PCR: NEGATIVE
SARS Coronavirus 2 by RT PCR: NEGATIVE

## 2021-01-24 LAB — BRAIN NATRIURETIC PEPTIDE: B Natriuretic Peptide: 631.8 pg/mL — ABNORMAL HIGH (ref 0.0–100.0)

## 2021-01-24 LAB — TSH: TSH: 2.764 u[IU]/mL (ref 0.350–4.500)

## 2021-01-24 LAB — TROPONIN I (HIGH SENSITIVITY)
Troponin I (High Sensitivity): 229 ng/L (ref <18)
Troponin I (High Sensitivity): 257 ng/L (ref <18)

## 2021-01-24 LAB — LIPASE, BLOOD: Lipase: 42 U/L (ref 11–51)

## 2021-01-24 MED ORDER — SODIUM ZIRCONIUM CYCLOSILICATE 10 G PO PACK
10.0000 g | PACK | Freq: Once | ORAL | Status: AC
  Administered 2021-01-25: 10 g via ORAL

## 2021-01-24 MED ORDER — INSULIN ASPART 100 UNIT/ML IV SOLN
5.0000 [IU] | Freq: Once | INTRAVENOUS | Status: AC
  Administered 2021-01-25: 5 [IU] via INTRAVENOUS
  Filled 2021-01-24: qty 0.05

## 2021-01-24 MED ORDER — DEXTROSE 10 % IV SOLN
Freq: Once | INTRAVENOUS | Status: AC
  Administered 2021-01-25: 500 mL via INTRAVENOUS

## 2021-01-24 MED ORDER — DEXTROSE 50 % IV SOLN
1.0000 | Freq: Once | INTRAVENOUS | Status: AC
  Administered 2021-01-25: 50 mL via INTRAVENOUS

## 2021-01-24 MED ORDER — SODIUM CHLORIDE 0.9 % IV SOLN
500.0000 mg | Freq: Once | INTRAVENOUS | Status: AC
  Administered 2021-01-25: 500 mg via INTRAVENOUS
  Filled 2021-01-24: qty 500

## 2021-01-24 MED ORDER — SODIUM CHLORIDE 0.9 % IV SOLN
1.0000 g | Freq: Once | INTRAVENOUS | Status: AC
  Administered 2021-01-25: 1 g via INTRAVENOUS

## 2021-01-24 NOTE — ED Notes (Signed)
Family of pt states pt was sent here for a paracentesis. Per the medic report, pt was sent for general weakness, SOB, decreased appetite. MD trifan made aware and that pt family would like to speak to him

## 2021-01-24 NOTE — Assessment & Plan Note (Addendum)
SOB, cough, pain in the left lower chest/lung with deep breathing or cough, increased generalized weakness, decreased appetite and oral intake. She is afebrile, no O2 desaturation. My examination revealed rales/wheezes left mid, lower lung fields. Will DuoNeb q8hrs x72hours, CBC/diff, CMP/eGFR, CXR to evaluate further.  Wbc 27.9, Bun 91, creat 2.79, recommend ED for further evaluation and treatment.  HPOA/patient declined ED, desires to be treated at [facility] with Hospice care. Will start Rocephin 1gm stat, then in am. Levaquin 567m qd x 7days.

## 2021-01-24 NOTE — Assessment & Plan Note (Signed)
multiple sites, takes Tylenol  

## 2021-01-24 NOTE — Assessment & Plan Note (Signed)
Edema BLE, not apparent, hold Furosemide x72hours in setting of dry mouth, decreased appetite/oral intakes.

## 2021-01-24 NOTE — Assessment & Plan Note (Addendum)
Bun/creat 91/2.79, eGFR 14, Furosemide on hold, recommend ED for further evaluation and treatment. Admit to SNF [facility], refer to Hospice service. D5 1/2 NS 75cc/hr x 1013m repeat CMP/eGFR 01/27/21

## 2021-01-24 NOTE — ED Notes (Signed)
Pt given blanket, pillow, lights dimmed

## 2021-01-24 NOTE — Assessment & Plan Note (Signed)
Bun/creat 35/1.3 12/21/20

## 2021-01-24 NOTE — Progress Notes (Addendum)
Location:   [facility] Room Number: 790 Place of Service:  ALF (13) Provider: Lennie Odor Coralee Edberg NP  Orlo Brickle X, NP  Patient Care Team: Tajuan Dufault X, NP as PCP - General (Internal Medicine) Debara Pickett Nadean Corwin, MD as PCP - Cardiology (Cardiology)  Extended Emergency Contact Information Primary Emergency Contact: Anastasia Fiedler, [place] Phone: [phone] Relation: Son Secondary Emergency Contact: [place] Phone: [phone] Relation: Friend  Code Status: DNR Goals of care: Advanced Directive information Advanced Directives 01/07/2021  Does Patient Have a Medical Advance Directive? Yes  Type of Advance Directive [place]  Does patient want to make changes to medical advance directive? No - Patient declined  Copy of [place] in Chart? Yes - validated most recent copy scanned in chart (See row information)  Pre-existing out of facility DNR order (yellow form or pink MOST form) -     Chief Complaint  Patient presents with   Acute Visit    SOB, generalized weakness, decreased appetite and oral intake.      HPI:  Pt is a 87 y.o. female seen today for an acute visit for SOB, cough, pain in the left lower chest/lung with deep breathing or cough, increased generalized weakness, decreased appetite and oral intake. She is afebrile, no O2 desaturation.    Edema BLE, not apparent, takes Furosemide.             OA, multiple sites, takes Tylenol.               HTN, on Lisinopril,  Metoprolol. Bun/creat 35/1.3 12/21/20             Hypothyroidism, stable, on Levothyroxine 13mcg qd. TSH 2.16 04/29/20             CKD Bun/creat 35/1.3 12/21/20             Gout asymptomatic, uric acid 7.2 06/10/20             Gait abnormality, need w/c due to increased frailty.              AVB Mobitz 2, s/p pacemaker.              PAF continue Metoprolol, avoid anticoagulation    Past Medical History:   Diagnosis Date   Breast cancer [facility])    Cardiac pacemaker in situ 04/01/2004   Original implant 04/01/04 Dr. Doreatha Lew Indications syncope with Mobitz 2 heart block  RV lead Guidant 4470 52 cm bipolar lead, SN [id number] atrial lead Guidant bipolar serial #[id number].  Medtronic EnPulse Model number [id number], SN PNB [id number]    Carotid bruit 10/25/2010   right   Diverticulosis    H/O syncope    Hearing loss in right ear 05/26/2011   Hiatal hernia    HTN (hypertension), benign    Hyperlipidemia    Melanoma of back ([place]) 2009   Mobitz II    Osteoporosis    Past Surgical History:  Procedure Laterality Date   BREAST BIOPSY  1960   Left   BREAST LUMPECTOMY  1960   left 2017   BREAST LUMPECTOMY WITH RADIOACTIVE SEED LOCALIZATION Left 09/10/2015   Procedure: BREAST LUMPECTOMY WITH RADIOACTIVE SEED LOCALIZATION;  Surgeon: Excell Seltzer, MD;  Location: [place];  Service: General;  Laterality: Left;   CATARACT EXTRACTION Bilateral 2002   Dr. Katy Fitch   CATARACT EXTRACTION, BILATERAL  2001   INSERT /  REPLACE / REMOVE PACEMAKER     LOM HEMOTYMPANUM THROAT & SINUS BX  1977   PACEMAKER GENERATOR CHANGE N/A 08/16/2012   Procedure: PACEMAKER GENERATOR CHANGE;  Surgeon: Deboraha Sprang, MD;  Location: [facility] CATH LAB;  Service: Cardiovascular;  Laterality: N/A;   PACEMAKER INSERTION  2006   Medtronic DDDR [id number], SERIAL #  [id number]   surgery of ear      Allergies  Allergen Reactions   Adhesive [Tape] Itching and Rash    Please use "paper" tape   Codeine Nausea Only   Triamterene-Hctz Other (See Comments)    fatigue   Vasotec Diarrhea    Allergies as of 01/24/2021       Reactions   Adhesive [tape] Itching, Rash   Please use "paper" tape   Codeine Nausea Only   Triamterene-hctz Other (See Comments)   fatigue   Vasotec Diarrhea        Medication List        Accurate as of January 24, 2021  4:01 PM. If you have any questions, ask your nurse or doctor.          acetaminophen 500 MG  tablet Commonly known as: TYLENOL Take 500 mg by mouth every 6 (six) hours as needed.   beta carotene w/minerals tablet Take 1 tablet by mouth daily.   furosemide 20 MG tablet Commonly known as: LASIX Take 20 mg by mouth daily.   levothyroxine 50 MCG tablet Commonly known as: SYNTHROID TAKE 1 TABLET ONCE DAILY.   lisinopril 20 MG tablet Commonly known as: ZESTRIL Take 1 tablet (20 mg total) by mouth daily.   loperamide 2 MG tablet Commonly known as: IMODIUM A-D Take 2 mg by mouth daily as needed for diarrhea or loose stools.   metoprolol succinate 25 MG 24 hr tablet Commonly known as: TOPROL-XL TAKE 1 TABLET ONCE DAILY.   potassium chloride SA 20 MEQ tablet Commonly known as: KLOR-CON Take 0.5 tablets (10 mEq total) by mouth every other day.   Vitamin D-3 25 MCG (1000 UT) Caps Take 1,000 Units by mouth daily.        Review of Systems  Constitutional:  Positive for appetite change and fatigue. Negative for fever.  HENT:  Positive for hearing loss. Negative for congestion, sore throat, trouble swallowing and voice change.        Hearing aids.   Eyes:  Negative for visual disturbance.       S/p inj, macular degeneration  Respiratory:  Positive for cough, shortness of breath and wheezing.        DOE, chronic hacking cough. Left lower chest/lung pain with deep breathing or coughing.   Cardiovascular:  Negative for leg swelling.  Gastrointestinal:  Negative for abdominal pain and constipation.  Genitourinary:  Positive for frequency.       Every 2-3 hours at night  Musculoskeletal:  Positive for arthralgias and gait problem.  Skin:  Negative for color change.  Neurological:  Negative for weakness and light-headedness.       Tingling numbness in hands.   Psychiatric/Behavioral:  Negative for behavioral problems and sleep disturbance. The patient is not nervous/anxious.    Immunization History  Administered Date(s) Administered   Fluad Quad(high Dose 65+) 12/10/2018    Influenza Split 12/10/2012   Influenza, High Dose Seasonal PF 12/10/2019   Influenza-Unspecified 12/11/2013, 11/28/2014, 12/08/2015, 12/04/2016, 11/27/2017, 12/10/2019, 12/16/2020   Moderna SARS-COV2 Booster Vaccination 01/06/2020   Moderna Sars-Covid-2 Vaccination 03/03/2019, 03/31/2019   PFIZER(Purple Top)SARS-COV-2 Vaccination 07/27/2020, 11/16/2020  Pneumococcal Conjugate-13 01/06/2014, 01/08/2014   Pneumococcal Polysaccharide-23 04/20/2009   Tdap 07/03/2000, 05/02/2011   Zoster, Live 10/20/2009   Pertinent  Health Maintenance Due  Topic Date Due   INFLUENZA VACCINE  Completed   DEXA SCAN  Addressed   Fall Risk 12/11/2019 01/30/2020 05/20/2020 06/17/2020 08/05/2020  Falls in the past year? 1 1 0 0 0  Was there an injury with Fall? 0 1 - 0 0  Fall Risk Category Calculator 1 2 - 0 0  Fall Risk Category Low Moderate - Low Low  Patient Fall Risk Level - - - Low fall risk Low fall risk  Patient at Risk for Falls Due to - - - - -   Functional Status Survey:    Vitals:   01/24/21 1022  BP: (!) 162/98  Pulse: 60  Resp: 18  Temp: (!) 97.1 F (36.2 C)  SpO2: 95%   There is no height or weight on file to calculate BMI. Physical Exam Vitals and nursing note reviewed.  Constitutional:      Comments: fatigue  HENT:     Head: Normocephalic and atraumatic.     Mouth/Throat:     Mouth: Mucous membranes are dry.  Eyes:     Extraocular Movements: Extraocular movements intact.     Conjunctiva/sclera: Conjunctivae normal.     Pupils: Pupils are equal, round, and reactive to light.     Comments: Mild ectropion R+L lower eyelids  Cardiovascular:     Rate and Rhythm: Normal rate and regular rhythm.     Heart sounds: Murmur heard.  Pulmonary:     Breath sounds: Wheezing, rhonchi and rales present.     Comments: SOB is apparent. Left mid to lower lung rales and wheezes.  Chest:     Chest wall: No tenderness.  Abdominal:     General: Bowel sounds are normal.     Palpations:  Abdomen is soft.     Tenderness: There is no abdominal tenderness.  Musculoskeletal:     Cervical back: Normal range of motion and neck supple.     Right lower leg: Edema present.     Left lower leg: Edema present.     Comments: Fullness left popliteal area. Edema BLE mostly in the right ankle.   Skin:    General: Skin is warm and dry.     Findings: Bruising present.     Comments: Mild chronic venous insufficiency skin changes BLE. A golf ball sized, hard, irregular, orange peel skin appearance left breast.  Opted out workup and tx. A bruise dorsum right hand from previous fall.   Neurological:     General: No focal deficit present.     Mental Status: She is alert and oriented to person, place, and time. Mental status is at baseline.     Motor: No weakness.     Coordination: Coordination normal.     Gait: Gait abnormal.  Psychiatric:        Mood and Affect: Mood normal.        Behavior: Behavior normal.        Thought Content: Thought content normal.        Judgment: Judgment normal.    Labs reviewed: Recent Labs    04/29/20 0730 06/10/20 0835 09/06/20 0934 12/21/20 0000  NA 141 144 141 140  K 3.9 4.4 4.1 3.9  CL 105 107 105 102  CO2 27 23 27  25*  GLUCOSE 89 85 77  --   BUN 42* 32* 34* 35*  CREATININE 1.46* 1.27* 1.23* 1.3*  CALCIUM 8.9 9.0 9.0  --    Recent Labs    04/29/20 0730 06/10/20 0835 09/06/20 0934 12/21/20 0000  AST 11 13 14  12*  ALT 8 12 13 19   ALKPHOS  --   --   --  98  BILITOT 0.7 0.6 0.5  --   PROT 5.8* 6.0* 6.0*  --   ALBUMIN  --   --   --  3.3*   Recent Labs    12/21/20 0000  WBC 8.5  NEUTROABS 5,763.00  HGB 13.0  HCT 39  PLT 192   Lab Results  Component Value Date   TSH 2.16 04/29/2020   No results found for: HGBA1C Lab Results  Component Value Date   CHOL 143 07/22/2019   HDL 62 07/22/2019   LDLCALC 64 07/22/2019   TRIG 91 07/22/2019   CHOLHDL 2.3 07/22/2019    Significant Diagnostic Results in last 30 days:  Intravitreal  Injection, Pharmacologic Agent - OD - Right Eye  Result Date: 12/28/2020 Time Out 12/28/2020. 10:57 AM. Confirmed correct patient, procedure, site, and patient consented. Anesthesia Topical anesthesia was used. Anesthetic medications included Akten 3.5%. Procedure Preparation included 10% betadine to eyelids, 5% betadine to ocular surface, Ofloxacin . A 30 gauge needle was used. Injection: 2 mg aflibercept 2 MG/0.05ML   Route: Intravitreal, Site: Right Eye   NDC: [id number], Lot: [id number], Waste: 0 mL Post-op Post injection exam found visual acuity of at least counting fingers. The patient tolerated the procedure well. There were no complications. The patient received written and verbal post procedure care education. Post injection medications included ocuflox.   OCT, Retina - OU - Both Eyes  Result Date: 12/28/2020 Right Eye Quality was good. Scan locations included subfoveal. Central Foveal Thickness: 276. Progression has been stable. Findings include abnormal foveal contour, retinal drusen , subretinal scarring, subretinal hyper-reflective material, no IRF. Left Eye Quality was good. Scan locations included subfoveal. Central Foveal Thickness: 286. Progression has been stable. Findings include abnormal foveal contour, central retinal atrophy, outer retinal atrophy. Notes Visual acuity preserved OD with less subretinal fluid today at 9-week follow-up.  We will repeat injection today and maintain 9-week interval follow-up exams    Assessment/Plan: SOB (shortness of breath) SOB, cough, pain in the left lower chest/lung with deep breathing or cough, increased generalized weakness, decreased appetite and oral intake. She is afebrile, no O2 desaturation. My examination revealed rales/wheezes left mid, lower lung fields. Will DuoNeb q8hrs x72hours, CBC/diff, CMP/eGFR, CXR to evaluate further.  Wbc 27.9, Bun 91, creat 2.79, recommend ED for further evaluation and treatment.  HPOA/patient declined ED,  desires to be [facility] with Hospice care. Will start Rocephin 1gm stat, then in am. Levaquin 561m qd x 7days.   Edema of both lower extremities due to peripheral venous insufficiency Edema BLE, not apparent, hold Furosemide x72hours in setting of dry mouth, decreased appetite/oral intakes.   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   Essential hypertension Mildly elevated today,  on Lisinopril,  Metoprolol. Bun/creat 35/1.3 12/21/20. Observe.   Hypothyroidism stable, on Levothyroxine 522m qd. TSH 2.16 04/29/20  CKD (chronic kidney disease) stage 3, GFR 30-59 ml/min (HCC) Bun/creat 35/1.3 12/21/20  Gait abnormality  need w/c due to increased frailty.   Gout No flare up  Pacemaker AVB Mobitz 2, s/p pacemaker.   PAF (paroxysmal atrial fibrillation) (HCC) Heart rate is in control, continue Metoprolol, avoid anticoagulation    Leukocytosis Wbc 27.9,  Bun/creat 91/2.79 in setting of SOB, generalized weaknes, pending CXR. Recommend ED for further evaluation and treatment.  Will start Rocephin 1gm stat, then in am. Levaquin 531m qd x 7days in SNF [facility], repeat CBC/diff 01/27/21   AKI (acute kidney injury) ([place] Bun/creat 91/2.79, eGFR 14, Furosemide on hold, recommend ED for further evaluation and treatment. [facility]. D5 1/2 NS 75cc/hr x 10048mrepeat CMP/eGFR 01/27/21    Family/ staff Communication: plan of care reviewed with the patient and charge nurse.   Labs/tests ordered:  CBC/diff, CMP/eGFR, CXR  Time spend 40 minutes.

## 2021-01-24 NOTE — ED Provider Notes (Signed)
[facility] DEPT Provider Note   CSN: [id number] Arrival date & time: 01/24/21  1731     History Chief Complaint  Patient presents with   Weakness    [name] [name] is a 87 y.o. female.  [facility]  Several days ago patient had leg swelling was treated with lots of Lasix.  They sent her in today because she was short of breath and having abdominal distention.  Abdominal is been distended for 4 days.  Patient with oxygen sat on room air in the upper 80s started on 2 L at 1 point may have been on 3 L but currently on 2 L and satting in the upper 90s.  But she is at rest.  Patient has no abdominal tenderness denies any chest pain.  Says she does have shortness of breath and her abdomen is distention.  Currently here today no lower extremity swelling.  Patient is a DNR.      Past Medical History:  Diagnosis Date   Breast cancer [facility])    Cardiac pacemaker in situ 04/01/2004   Original implant 04/01/04 Dr. Doreatha Lew Indications syncope with Mobitz 2 heart block  RV lead Guidant 4470 52 cm bipolar lead, SN [id number] atrial lead Guidant bipolar serial #[id number].  Medtronic EnPulse Model number [id number], SN PNB [id number]    Carotid bruit 10/25/2010   right   Diverticulosis    H/O syncope    Hearing loss in right ear 05/26/2011   Hiatal hernia    HTN (hypertension), benign    Hyperlipidemia    Melanoma of back [facility]) 2009   Mobitz II    Osteoporosis     Patient Active Problem List   Diagnosis Date Noted   ARF (acute renal failure) ([place]) 01/25/2021   SOB (shortness of breath) 01/24/2021   Leukocytosis 01/24/2021   AKI (acute kidney injury) ([place]) 01/24/2021   Fall 08/23/2020   Gait abnormality 08/23/2020   Left breast mass 05/20/2020   Posterior vitreous detachment of both eyes 04/26/2020   Gout 12/16/2019   Fracture dislocation of right wrist 12/11/2019   Advanced nonexudative age-related macular degeneration of left eye with subfoveal  involvement 12/02/2019   Edema of both lower extremities due to peripheral venous insufficiency 10/30/2019   Blepharitis of lower eyelids of both eyes 07/10/2019   Leg skin lesion, right 07/10/2019   Exudative age-related macular degeneration of right eye with active choroidal neovascularization ([place]) 06/12/2019   Exudative age-related macular degeneration of left eye with inactive choroidal neovascularization ([place]) 06/12/2019   Early stage nonexudative age-related macular degeneration of right eye 06/12/2019   Generalized osteoarthritis of multiple sites 04/24/2019   CKD (chronic kidney disease) stage 3, GFR 30-59 ml/min (HCC) 04/24/2019   Neuropathy 04/24/2019   Urinary frequency 04/24/2019   Baker's cyst, left 04/24/2019   PAF (paroxysmal atrial fibrillation) ([place]) 01/09/2018   Mixed hyperlipidemia 01/09/2018   Macular degeneration 07/02/2014   Hypothyroidism 01/07/2014   Benign paroxysmal positional vertigo 03/28/2013   Atrioventricular block, complete ([place]) 07/23/2012   Hearing loss 05/26/2011   Essential hypertension 10/25/2010   Osteoporosis 10/25/2010   Melanoma of back ([place]) 10/25/2010   Carotid bruit 10/25/2010   Hyperlipidemia    Hiatal hernia    Diverticulosis    Pacemaker 04/01/2004    Past Surgical History:  Procedure Laterality Date   BREAST BIOPSY  1960   Left   BREAST LUMPECTOMY  1960   left 2017   BREAST LUMPECTOMY WITH RADIOACTIVE SEED LOCALIZATION Left  09/10/2015   Procedure: BREAST LUMPECTOMY WITH RADIOACTIVE SEED LOCALIZATION;  Surgeon: Excell Seltzer, MD;  Location: [facility];  Service: General;  Laterality: Left;   CATARACT EXTRACTION Bilateral 2002   Dr. Katy Fitch   CATARACT EXTRACTION, BILATERAL  2001   INSERT / REPLACE / REMOVE PACEMAKER     LOM HEMOTYMPANUM THROAT & SINUS BX  1977   PACEMAKER GENERATOR CHANGE N/A 08/16/2012   Procedure: PACEMAKER GENERATOR CHANGE;  Surgeon: Deboraha Sprang, MD;  Location: [facility] CATH LAB;  Service: Cardiovascular;   Laterality: N/A;   PACEMAKER INSERTION  2006   Medtronic DDDR [id number], SERIAL #  [id number]   surgery of ear       OB History   No obstetric history on file.     Family History  Problem Relation Age of Onset   Stroke Mother 70   Kidney disease Father 31   Cirrhosis Son     Social History   Tobacco Use   Smoking status: Former    Packs/day: 0.33    Years: 10.00    Pack years: 3.30    Types: Cigarettes    Quit date: 02/27/1946    Years since quitting: 74.9   Smokeless tobacco: Never  Vaping Use   Vaping Use: Never used  Substance Use Topics   Alcohol use: Yes    Alcohol/week: 7.0 standard drinks    Types: 7 Glasses of wine per week    Comment: wine   Drug use: No    Home Medications Prior to Admission medications   Medication Sig Start Date End Date Taking? Authorizing Provider  acetaminophen (TYLENOL) 500 MG tablet Take 500 mg by mouth every 6 (six) hours as needed for mild pain.   Yes [provider]  beta carotene w/minerals (OCUVITE) tablet Take 1 tablet by mouth daily.   Yes [provider]  Cholecalciferol (VITAMIN D-3) 1000 units CAPS Take 1,000 Units by mouth daily.   Yes [provider]  furosemide (LASIX) 20 MG tablet Take 20 mg by mouth daily.   Yes [provider]  ipratropium-albuterol (DUONEB) 0.5-2.5 (3) MG/3ML SOLN Take 3 mLs by nebulization every 8 (eight) hours as needed.   Yes [provider]  levothyroxine (SYNTHROID) 50 MCG tablet TAKE 1 TABLET ONCE DAILY. Patient taking differently: Take 50 mcg by mouth daily before breakfast. 06/10/20  Yes Mast, Man X, NP  lisinopril (ZESTRIL) 20 MG tablet Take 1 tablet (20 mg total) by mouth daily. 05/20/20  Yes Mast, Man X, NP  loperamide (IMODIUM A-D) 2 MG tablet Take 2 mg by mouth daily as needed for diarrhea or loose stools.   Yes [provider]  metoprolol succinate (TOPROL-XL) 25 MG 24 hr tablet TAKE 1 TABLET ONCE DAILY. Patient taking differently: 25 mg  daily. 05/03/20  Yes Hilty, Nadean Corwin, MD  potassium chloride SA (KLOR-CON) 20 MEQ tablet Take 0.5 tablets (10 mEq total) by mouth every other day. 08/05/20  Yes Mast, Man X, NP    Allergies    Adhesive [tape], Codeine, Triamterene-hctz, and Vasotec  Review of Systems   Review of Systems  Constitutional:  Negative for chills and fever.  HENT:  Negative for ear pain and sore throat.   Eyes:  Negative for pain and visual disturbance.  Respiratory:  Positive for shortness of breath. Negative for cough.   Cardiovascular:  Negative for chest pain, palpitations and leg swelling.  Gastrointestinal:  Positive for abdominal distention. Negative for abdominal pain and vomiting.  Genitourinary:  Negative  for dysuria and hematuria.  Musculoskeletal:  Negative for arthralgias and back pain.  Skin:  Negative for color change and rash.  Neurological:  Negative for seizures and syncope.  All other systems reviewed and are negative.  Physical Exam Updated Vital Signs BP 135/63   Pulse 61   Temp (!) 97.5 F (36.4 C) (Oral)   Resp (!) 30   Ht 1.6 m (5\' 3" )   Wt 59 kg   SpO2 97%   BMI 23.03 kg/m   Physical Exam Vitals and nursing note reviewed.  Constitutional:      General: She is not in acute distress.    Appearance: Normal appearance. She is well-developed.  HENT:     Head: Normocephalic and atraumatic.  Eyes:     Extraocular Movements: Extraocular movements intact.     Conjunctiva/sclera: Conjunctivae normal.     Pupils: Pupils are equal, round, and reactive to light.  Cardiovascular:     Rate and Rhythm: Normal rate and regular rhythm.     Heart sounds: No murmur heard. Pulmonary:     Effort: Pulmonary effort is normal. No respiratory distress.     Breath sounds: Rales present.  Abdominal:     General: There is distension.     Palpations: Abdomen is soft.     Tenderness: There is no abdominal tenderness. There is no guarding.  Musculoskeletal:        General: No swelling.      Cervical back: Normal range of motion and neck supple.     Right lower leg: No edema.     Left lower leg: No edema.  Skin:    General: Skin is warm and dry.     Capillary Refill: Capillary refill takes less than 2 seconds.  Neurological:     General: No focal deficit present.     Mental Status: She is alert and oriented to person, place, and time.     Comments: Except for her hard of hearing which is baseline  Psychiatric:        Mood and Affect: Mood normal.    ED Results / Procedures / Treatments   Labs (all labs ordered are listed, but only abnormal results are displayed) Labs Reviewed  CBC WITH DIFFERENTIAL/PLATELET - Abnormal; Notable for the following components:      Result Value   WBC 28.0 (*)    RBC 5.16 (*)    nRBC 0.6 (*)    Neutro Abs 24.3 (*)    Monocytes Absolute 1.9 (*)    Abs Immature Granulocytes 0.44 (*)    All other components within normal limits  COMPREHENSIVE METABOLIC PANEL - Abnormal; Notable for the following components:   Potassium 5.8 (*)    CO2 17 (*)    Glucose, Bld 101 (*)    BUN 99 (*)    Creatinine, Ser 3.34 (*)    Albumin 3.3 (*)    AST 182 (*)    ALT 229 (*)    Alkaline Phosphatase 147 (*)    Total Bilirubin 2.5 (*)    GFR, Estimated 12 (*)    Anion gap 19 (*)    All other components within normal limits  BRAIN NATRIURETIC PEPTIDE - Abnormal; Notable for the following components:   B Natriuretic Peptide 631.8 (*)    All other components within normal limits  TROPONIN I (HIGH SENSITIVITY) - Abnormal; Notable for the following components:   Troponin I (High Sensitivity) 229 (*)    All other components within normal  limits  TROPONIN I (HIGH SENSITIVITY) - Abnormal; Notable for the following components:   Troponin I (High Sensitivity) 257 (*)    All other components within normal limits  RESP PANEL BY RT-PCR (FLU A&B, COVID) ARPGX2  CULTURE, BLOOD (ROUTINE X 2)  CULTURE, BLOOD (ROUTINE X 2)  TSH  LIPASE, BLOOD  URINALYSIS, ROUTINE W  REFLEX MICROSCOPIC  LACTIC ACID, PLASMA    EKG EKG Interpretation  Date/Time:  Monday January 24 2021 19:17:54 EST Ventricular Rate:  60 PR Interval:    QRS Duration: 145 QT Interval:  508 QTC Calculation: 508 R Axis:   -62 Text Interpretation: Atrial sensed and ventricular paced Nonspecific IVCD with LAD LVH with secondary repolarization abnormality Inferior infarct, old Anterior Q waves, possibly due to LVH Patient with history of pacemaker.  May be atrial sensed markings.  Possibly could be ventricular paced as well based on the heart rate of 60 Confirmed by Fredia Sorrow [phone]) on 01/24/2021 9:14:10 PM  Radiology DG Chest Port 1 View  Result Date: 01/24/2021 CLINICAL DATA:  Shortness of breath, weakness. EXAM: PORTABLE CHEST 1 VIEW COMPARISON:  Chest x-ray dated 07/21/2008. FINDINGS: Ill-defined airspace opacities at the bilateral lung bases. Additional interstitial prominence within the lower lung zones bilaterally, RIGHT greater than LEFT. Probable small bilateral pleural effusions. No pneumothorax is seen. Borderline cardiomegaly. LEFT chest wall pacemaker/ICD apparatus in place. Osseous structures about the chest are unremarkable. IMPRESSION: 1. Ill-defined airspace opacities at the bilateral lung bases, compatible with multifocal pneumonia and/or asymmetric pulmonary edema. 2. Probable small bilateral pleural effusions. 3. Borderline cardiomegaly. Electronically Signed   By: Franki Cabot M.D.   On: 01/24/2021 21:50    Procedures Procedures   Medications Ordered in ED Medications  cefTRIAXone (ROCEPHIN) 1 g in sodium chloride 0.9 % 100 mL IVPB (1 g Intravenous New Bag/Given 01/25/21 0024)  azithromycin (ZITHROMAX) 500 mg in sodium chloride 0.9 % 250 mL IVPB (has no administration in time range)  insulin aspart (novoLOG) injection 5 Units (5 Units Intravenous Given 01/25/21 0026)    And  dextrose 50 % solution 50 mL (50 mLs Intravenous Given 01/25/21 0025)  dextrose 10 %  infusion (500 mLs Intravenous New Bag/Given 01/25/21 0033)  sodium zirconium cyclosilicate (LOKELMA) packet 10 g (10 g Oral Given 01/25/21 0033)    ED Course  I have reviewed the triage vital signs and the nursing notes.  Pertinent labs & imaging results that were available during my care of the patient were reviewed by me and considered in my medical decision making (see chart for details).    MDM Rules/Calculators/A&P                         CRITICAL CARE Performed by: Fredia Sorrow Total critical care time: 45 minutes Critical care time was exclusive of separately billable procedures and treating other patients. Critical care was necessary to treat or prevent imminent or life-threatening deterioration. Critical care was time spent personally by me on the following activities: development of treatment plan with patient and/or surrogate as well as nursing, discussions with consultants, evaluation of patient's response to treatment, examination of patient, obtaining history from patient or surrogate, ordering and performing treatments and interventions, ordering and review of laboratory studies, ordering and review of radiographic studies, pulse oximetry and re-evaluation of patient's condition.  Patient with oxygen requirement.  May have community-acquired pneumonia bilaterally.  Also patient with acute kidney injury and hyperkalemia with a potassium of 5.8.  No evidence  of any EKG changes however patient is atrial sensed ventricular paced rhythm from a pacemaker.  So could be hard to tell.  Patient's troponin are elevated.  Second troponin was 257 so greater than 10 from the first first troponin was 229.  Patient without any chest pain.  Hemoglobin good at 14.9.  TSH is normal.  CT chest abdomen and pelvis is pending for clarification chest x-ray raise concern for ill-defined airspace opacities at bilateral lung bases.  Compatible with a multifocal pneumonia.  However patient's COVID and  influenza testing is negative.  But patient was treated with Rocephin and Zithromax.  And reason for the CT belly is the marked abnormal LFTs as well as the abdominal distention.  However abdomen is totally nontender.  For the elevated potassium patient is receiving insulin and dextrose.  And received Lokelma.  Discussed with hospitalist who will admit.  Patient is a DNR.   Final Clinical Impression(s) / ED Diagnoses Final diagnoses:  Elevated troponin  AKI (acute kidney injury) ([place])  Community acquired pneumonia, unspecified laterality  Hyperkalemia    Rx / DC Orders ED Discharge Orders     None        Fredia Sorrow, MD 01/25/21 [phone]

## 2021-01-24 NOTE — Assessment & Plan Note (Signed)
Mildly elevated today,  on Lisinopril,  Metoprolol. Bun/creat 35/1.3 12/21/20. Observe.

## 2021-01-24 NOTE — ED Notes (Signed)
Critical troponin 257 given to MD [facility]

## 2021-01-24 NOTE — Assessment & Plan Note (Addendum)
Wbc 27.9, Bun/creat 91/2.79 in setting of SOB, generalized weaknes, pending CXR. Recommend ED for further evaluation and treatment.  Will start Rocephin 1gm stat, then in am. Levaquin 500mg  qd x 7days in SNF [facility], repeat CBC/diff 01/27/21

## 2021-01-24 NOTE — Assessment & Plan Note (Signed)
stable, on Levothyroxine 70mcg qd. TSH 2.16 04/29/20

## 2021-01-24 NOTE — Assessment & Plan Note (Signed)
AVB Mobitz 2, s/p pacemaker.

## 2021-01-24 NOTE — Assessment & Plan Note (Signed)
Heart rate is in control, continue Metoprolol, avoid anticoagulation

## 2021-01-24 NOTE — Assessment & Plan Note (Signed)
need w/c due to increased frailty.

## 2021-01-24 NOTE — Assessment & Plan Note (Signed)
No flareup .

## 2021-01-24 NOTE — ED Notes (Signed)
Blood sent with save tube labels

## 2021-01-24 NOTE — ED Triage Notes (Signed)
Pt BIBA for general weakness, SOB and decreased appetite x 3 days.

## 2021-01-25 ENCOUNTER — Emergency Department (HOSPITAL_COMMUNITY): Payer: Medicare Other

## 2021-01-25 DIAGNOSIS — A419 Sepsis, unspecified organism: Secondary | ICD-10-CM | POA: Diagnosis present

## 2021-01-25 DIAGNOSIS — Z841 Family history of disorders of kidney and ureter: Secondary | ICD-10-CM | POA: Diagnosis not present

## 2021-01-25 DIAGNOSIS — N1832 Chronic kidney disease, stage 3b: Secondary | ICD-10-CM | POA: Diagnosis present

## 2021-01-25 DIAGNOSIS — H9191 Unspecified hearing loss, right ear: Secondary | ICD-10-CM | POA: Diagnosis present

## 2021-01-25 DIAGNOSIS — I441 Atrioventricular block, second degree: Secondary | ICD-10-CM | POA: Diagnosis present

## 2021-01-25 DIAGNOSIS — E785 Hyperlipidemia, unspecified: Secondary | ICD-10-CM | POA: Diagnosis present

## 2021-01-25 DIAGNOSIS — N179 Acute kidney failure, unspecified: Secondary | ICD-10-CM | POA: Diagnosis present

## 2021-01-25 DIAGNOSIS — I5031 Acute diastolic (congestive) heart failure: Secondary | ICD-10-CM | POA: Diagnosis present

## 2021-01-25 DIAGNOSIS — N170 Acute kidney failure with tubular necrosis: Secondary | ICD-10-CM | POA: Diagnosis not present

## 2021-01-25 DIAGNOSIS — J189 Pneumonia, unspecified organism: Secondary | ICD-10-CM

## 2021-01-25 DIAGNOSIS — R0902 Hypoxemia: Secondary | ICD-10-CM | POA: Diagnosis not present

## 2021-01-25 DIAGNOSIS — J9601 Acute respiratory failure with hypoxia: Secondary | ICD-10-CM | POA: Diagnosis present

## 2021-01-25 DIAGNOSIS — E875 Hyperkalemia: Secondary | ICD-10-CM | POA: Diagnosis present

## 2021-01-25 DIAGNOSIS — Z66 Do not resuscitate: Secondary | ICD-10-CM | POA: Diagnosis present

## 2021-01-25 DIAGNOSIS — R778 Other specified abnormalities of plasma proteins: Secondary | ICD-10-CM | POA: Diagnosis not present

## 2021-01-25 DIAGNOSIS — I10 Essential (primary) hypertension: Secondary | ICD-10-CM | POA: Diagnosis not present

## 2021-01-25 DIAGNOSIS — Z853 Personal history of malignant neoplasm of breast: Secondary | ICD-10-CM | POA: Diagnosis not present

## 2021-01-25 DIAGNOSIS — Z85820 Personal history of malignant melanoma of skin: Secondary | ICD-10-CM | POA: Diagnosis not present

## 2021-01-25 DIAGNOSIS — Z20822 Contact with and (suspected) exposure to covid-19: Secondary | ICD-10-CM | POA: Diagnosis present

## 2021-01-25 DIAGNOSIS — I509 Heart failure, unspecified: Secondary | ICD-10-CM | POA: Diagnosis not present

## 2021-01-25 DIAGNOSIS — K7200 Acute and subacute hepatic failure without coma: Secondary | ICD-10-CM | POA: Diagnosis present

## 2021-01-25 DIAGNOSIS — E039 Hypothyroidism, unspecified: Secondary | ICD-10-CM | POA: Diagnosis present

## 2021-01-25 DIAGNOSIS — Z823 Family history of stroke: Secondary | ICD-10-CM | POA: Diagnosis not present

## 2021-01-25 DIAGNOSIS — E872 Acidosis, unspecified: Secondary | ICD-10-CM | POA: Diagnosis present

## 2021-01-25 DIAGNOSIS — I480 Paroxysmal atrial fibrillation: Secondary | ICD-10-CM | POA: Diagnosis present

## 2021-01-25 DIAGNOSIS — R188 Other ascites: Secondary | ICD-10-CM | POA: Diagnosis present

## 2021-01-25 DIAGNOSIS — Z515 Encounter for palliative care: Secondary | ICD-10-CM | POA: Diagnosis not present

## 2021-01-25 DIAGNOSIS — M810 Age-related osteoporosis without current pathological fracture: Secondary | ICD-10-CM | POA: Diagnosis present

## 2021-01-25 DIAGNOSIS — I130 Hypertensive heart and chronic kidney disease with heart failure and stage 1 through stage 4 chronic kidney disease, or unspecified chronic kidney disease: Secondary | ICD-10-CM | POA: Diagnosis present

## 2021-01-25 LAB — LACTIC ACID, PLASMA: Lactic Acid, Venous: 2.9 mmol/L (ref 0.5–1.9)

## 2021-01-25 LAB — PROCALCITONIN: Procalcitonin: 5.98 ng/mL

## 2021-01-25 LAB — CBG MONITORING, ED: Glucose-Capillary: 205 mg/dL — ABNORMAL HIGH (ref 70–99)

## 2021-01-25 MED ORDER — ALBUTEROL SULFATE (2.5 MG/3ML) 0.083% IN NEBU: 2.5000 mg | INHALATION_SOLUTION | RESPIRATORY_TRACT | Status: DC | PRN

## 2021-01-25 MED ORDER — ENOXAPARIN SODIUM 30 MG/0.3ML IJ SOSY: 30.0000 mg | PREFILLED_SYRINGE | INTRAMUSCULAR | Status: DC

## 2021-01-25 MED ORDER — SODIUM CHLORIDE 0.9 % IV SOLN
2.0000 g | INTRAVENOUS | Status: DC
  Administered 2021-01-25: 2 g via INTRAVENOUS
  Filled 2021-01-25 (×2): qty 20

## 2021-01-25 MED ORDER — SODIUM CHLORIDE 0.9% FLUSH
3.0000 mL | Freq: Two times a day (BID) | INTRAVENOUS | Status: DC
  Administered 2021-01-25 – 2021-01-26 (×3): 3 mL via INTRAVENOUS

## 2021-01-25 MED ORDER — SODIUM CHLORIDE 0.9 % IV SOLN: 250.0000 mL | INTRAVENOUS | Status: DC | PRN

## 2021-01-25 MED ORDER — SODIUM CHLORIDE 0.9 % IV SOLN
500.0000 mg | INTRAVENOUS | Status: DC
  Administered 2021-01-25: 500 mg via INTRAVENOUS
  Filled 2021-01-25 (×2): qty 500

## 2021-01-25 MED ORDER — SODIUM CHLORIDE 0.9% FLUSH: 3.0000 mL | INTRAVENOUS | Status: DC | PRN

## 2021-01-25 MED ORDER — LEVOTHYROXINE SODIUM 50 MCG PO TABS
50.0000 ug | ORAL_TABLET | Freq: Every day | ORAL | Status: DC
  Administered 2021-01-25 – 2021-01-26 (×2): 50 ug via ORAL
  Filled 2021-01-25: qty 1

## 2021-01-25 MED ORDER — SODIUM CHLORIDE 0.9 % IV SOLN
INTRAVENOUS | Status: AC
  Filled 2021-01-25: qty 20

## 2021-01-25 MED ORDER — METOPROLOL SUCCINATE ER 25 MG PO TB24
25.0000 mg | ORAL_TABLET | Freq: Every day | ORAL | Status: DC
  Administered 2021-01-25 – 2021-01-26 (×2): 25 mg via ORAL
  Filled 2021-01-25: qty 1

## 2021-01-25 MED ORDER — MORPHINE SULFATE (PF) 2 MG/ML IV SOLN: 1.0000 mg | INTRAVENOUS | Status: DC | PRN

## 2021-01-25 MED ORDER — SODIUM ZIRCONIUM CYCLOSILICATE 10 G PO PACK
PACK | ORAL | Status: AC
  Filled 2021-01-25: qty 1

## 2021-01-25 MED ORDER — DEXTROSE 50 % IV SOLN
INTRAVENOUS | Status: AC
  Filled 2021-01-25: qty 50

## 2021-01-25 MED ORDER — IPRATROPIUM-ALBUTEROL 0.5-2.5 (3) MG/3ML IN SOLN: 3.0000 mL | RESPIRATORY_TRACT | Status: DC | PRN

## 2021-01-25 NOTE — Evaluation (Addendum)
Physical Therapy Evaluation Patient Details Name: [name] [name] MRN: [id number] DOB: September 22, 1916 Today's Date: 01/25/2021  History of Present Illness  41 female admitted with acute respiratory failure 2* Pna vs CHF. Hx of osteoporosis, pacemaker, CVA, Mobitz II, breast ca, CKD, hypothyroidism  Clinical Impression  On eval, pt required Mod A for mobility. She was able to pivot bed<>bsc during session. She fatigues quickly/easily with activity. Dyspnea 3/4. Remained on [place] O2 during session. Spoke with sons in hallway who declined PT f/u after discharge. They mentioned that palliative care will be involved. Will plan to follow and work with pt during hospital stay as tolerated (as long as that is in line with pt and family wishes). They are hopeful pt will be able to return to her ALF.      Recommendations for follow up therapy are one component of a multi-disciplinary discharge planning process, led by the attending physician.  Recommendations may be updated based on patient status, additional functional criteria and insurance authorization.  Follow Up Recommendations No PT follow up (family does not want any PT f/u)    Assistance Recommended at Discharge Frequent or constant Supervision/Assistance  Functional Status Assessment Patient has had a recent decline in their functional status and/or demonstrates limited ability to make significant improvements in function in a reasonable and predictable amount of time  Equipment Recommendations  None recommended by PT    Recommendations for Other Services       Precautions / Restrictions Precautions Precautions: Fall Restrictions Weight Bearing Restrictions: No      Mobility  Bed Mobility Overal bed mobility: Needs Assistance Bed Mobility: Sit to Supine       Sit to supine: HOB elevated;Mod assist   General bed mobility comments: Assist for trunk and LEs. Increased time.    Transfers Overall transfer level: Needs  assistance Equipment used: Rolling walker (2 wheels);None Transfers: Sit to/from Stand;Bed to chair/wheelchair/BSC   Stand pivot transfers: Mod assist   Squat pivot transfers: Mod assist     General transfer comment: Squat pivot x 1 with Mod A. Stand pivot x 1 with RW with Mod A. Pt fatigues very easily. Shuffling steps. Remained on [place] O2-dyspnea with activity    Ambulation/Gait               General Gait Details: NT-unable to 2* weakness, fatigue, dyspnea  Stairs            Wheelchair Mobility    Modified Rankin (Stroke Patients Only)       Balance Overall balance assessment: Needs assistance         Standing balance support: Bilateral upper extremity supported Standing balance-Leahy Scale: Poor                               Pertinent Vitals/Pain Pain Assessment: Faces Faces Pain Scale: No hurt    Home Living Family/patient expects to be discharged to:: Assisted living                 Home Equipment: Rollator (4 wheels)      Prior Function Prior Level of Function : Needs assist             Mobility Comments: using rollator for ambulation ADLs Comments: assist for bathing, dressing     Hand Dominance        Extremity/Trunk Assessment   Upper Extremity Assessment Upper Extremity Assessment: Generalized weakness    Lower Extremity  Assessment Lower Extremity Assessment: Generalized weakness    Cervical / Trunk Assessment Cervical / Trunk Assessment: Kyphotic  Communication   Communication: HOH  Cognition Arousal/Alertness: Awake/alert Behavior During Therapy: WFL for tasks assessed/performed Overall Cognitive Status: Within Functional Limits for tasks assessed                                          General Comments      Exercises     Assessment/Plan    PT Assessment Patient needs continued PT services  PT Problem List Decreased strength;Decreased mobility;Decreased activity  tolerance;Decreased balance;Decreased knowledge of use of DME       PT Treatment Interventions DME instruction;Gait training;Therapeutic activities;Therapeutic exercise;Patient/family education;Balance training;Functional mobility training    PT Goals (Current goals can be found in the Care Plan section)  Acute Rehab PT Goals Patient Stated Goal: none stated. family would like pt to return to [facility] ALF PT Goal Formulation: With patient Time For Goal Achievement: 02/08/21 Potential to Achieve Goals: Fair    Frequency Min 3X/week   Barriers to discharge        Co-evaluation               AM-PAC PT "6 Clicks" Mobility  Outcome Measure Help needed turning from your back to your side while in a flat bed without using bedrails?: A Lot Help needed moving from lying on your back to sitting on the side of a flat bed without using bedrails?: A Lot Help needed moving to and from a bed to a chair (including a wheelchair)?: A Lot Help needed standing up from a chair using your arms (e.g., wheelchair or bedside chair)?: A Lot Help needed to walk in hospital room?: Total Help needed climbing 3-5 steps with a railing? : Total 6 Click Score: 10    End of Session Equipment Utilized During Treatment: Oxygen;Gait belt Activity Tolerance: Patient limited by fatigue Patient left: in bed;with call bell/phone within reach   PT Visit Diagnosis: Muscle weakness (generalized) (M62.81);Difficulty in walking, not elsewhere classified (R26.2)    Time: 8270-7867 PT Time Calculation (min) (ACUTE ONLY): 14 min   Charges:   PT Evaluation $PT Eval Moderate Complexity: [place], PT Acute Rehabilitation  Office: [phone] Pager: [phone]

## 2021-01-25 NOTE — Progress Notes (Signed)
Pt with no urinary output this shift and no voiding since admission at 3 am this morning. Dr. Tana Coast was updated earlier this afternoon and per orders I/O cath x 1 done since Pt has had no urinary output with results of 125 cc amber urine returned. Pt tolerated procedure well

## 2021-01-25 NOTE — NC FL2 (Signed)
[place] LEVEL OF CARE SCREENING TOOL     IDENTIFICATION  Patient Name: [name] [name] Birthdate: 1916/12/17 Sex: female Admission Date (Current Location): 01/24/2021  [facility] and [place] Number:  Herbalist and Address:  [facility],  [place], [place]      Provider Number: [id number]  Attending Physician Name and Address:  Mendel Corning, MD  Relative Name and Phone Number:  Twania Bujak son [phone]    Current Level of Care: Hospital Recommended Level of Care: [place] Prior Approval Number:    Date Approved/Denied:   PASRR Number:    Discharge Plan: Other (Comment) (ALF-[facility])    Current Diagnoses: Patient Active Problem List   Diagnosis Date Noted   ARF (acute renal failure) ([place]) 01/25/2021   SOB (shortness of breath) 01/24/2021   Leukocytosis 01/24/2021   AKI (acute kidney injury) ([place]) 01/24/2021   Fall 08/23/2020   Gait abnormality 08/23/2020   Left breast mass 05/20/2020   Posterior vitreous detachment of both eyes 04/26/2020   Gout 12/16/2019   Fracture dislocation of right wrist 12/11/2019   Advanced nonexudative age-related macular degeneration of left eye with subfoveal involvement 12/02/2019   Edema of both lower extremities due to peripheral venous insufficiency 10/30/2019   Blepharitis of lower eyelids of both eyes 07/10/2019   Leg skin lesion, right 07/10/2019   Exudative age-related macular degeneration of right eye with active choroidal neovascularization ([place]) 06/12/2019   Exudative age-related macular degeneration of left eye with inactive choroidal neovascularization ([place]) 06/12/2019   Early stage nonexudative age-related macular degeneration of right eye 06/12/2019   Generalized osteoarthritis of multiple sites 04/24/2019   CKD (chronic kidney disease) stage 3, GFR 30-59 ml/min ([place]) 04/24/2019   Neuropathy 04/24/2019   Urinary frequency 04/24/2019    Baker's cyst, left 04/24/2019   PAF (paroxysmal atrial fibrillation) ([place]) 01/09/2018   Mixed hyperlipidemia 01/09/2018   Macular degeneration 07/02/2014   Hypothyroidism 01/07/2014   Benign paroxysmal positional vertigo 03/28/2013   Atrioventricular block, complete ([place]) 07/23/2012   Hearing loss 05/26/2011   Essential hypertension 10/25/2010   Osteoporosis 10/25/2010   Melanoma of back ([place]) 10/25/2010   Carotid bruit 10/25/2010   Hyperlipidemia    Hiatal hernia    Diverticulosis    Pacemaker 04/01/2004    Orientation RESPIRATION BLADDER Height & Weight     Self, Time, Situation, Place  Normal Continent Weight: 61.5 kg Height:  5\' 3"  (160 cm)  BEHAVIORAL SYMPTOMS/MOOD NEUROLOGICAL BOWEL NUTRITION STATUS      Continent Diet (Heart Healthy)  AMBULATORY STATUS COMMUNICATION OF NEEDS Skin   Limited Assist Verbally Normal                       Personal Care Assistance Level of Assistance  Bathing, Feeding, Dressing     Dressing Assistance: Limited assistance     Functional Limitations Info  Sight, Hearing, Speech Sight Info: Adequate Hearing Info: Adequate Speech Info: Adequate    SPECIAL CARE FACTORS FREQUENCY  PT (By licensed PT)                    Contractures Contractures Info: Not present    Additional Factors Info  Code Status, Allergies   Allergies Info:  (Adhesive (Tape), Codeine, Triamterene-hctz, Vasotec)           Current Medications (01/25/2021):  This is the current hospital active medication list Current Facility-Administered Medications  Medication Dose Route Frequency Provider Last Rate Last Admin   0.9 %  sodium chloride infusion  250 mL Intravenous PRN Rai, Ripudeep K, MD       azithromycin (ZITHROMAX) 500 mg in sodium chloride 0.9 % 250 mL IVPB  500 mg Intravenous Q24H Rai, Ripudeep K, MD       cefTRIAXone (ROCEPHIN) 2 g in sodium chloride 0.9 % 100 mL IVPB  2 g Intravenous Q24H Rai, Ripudeep K, MD       ipratropium-albuterol  (DUONEB) 0.5-2.5 (3) MG/3ML nebulizer solution 3 mL  3 mL Nebulization Q2H PRN Rai, Ripudeep K, MD       levothyroxine (SYNTHROID) tablet 50 mcg  50 mcg Oral QAC breakfast Rai, Ripudeep K, MD   50 mcg at 01/25/21 1002   metoprolol succinate (TOPROL-XL) 24 hr tablet 25 mg  25 mg Oral Daily Rai, Ripudeep K, MD   25 mg at 01/25/21 1002   morphine 2 MG/ML injection 1 mg  1 mg Intravenous Q3H PRN Rai, Ripudeep K, MD       sodium chloride flush (NS) 0.9 % injection 3 mL  3 mL Intravenous Q12H Rai, Ripudeep K, MD   3 mL at 01/25/21 1010   sodium chloride flush (NS) 0.9 % injection 3 mL  3 mL Intravenous PRN Rai, Ripudeep K, MD         Discharge Medications: Please see discharge summary for a list of discharge medications.  Relevant Imaging Results:  Relevant Lab Results:   Additional Information SS#[id number]  Trayveon Beckford, Juliann Pulse, RN

## 2021-01-25 NOTE — TOC Initial Note (Addendum)
Transition of Care [facility] [place]) - Initial/Assessment Note    Patient Details  Name: [name] [name] MRN: [id number] Date of Birth: 1916/06/15  Transition of Care [facility]) CM/SW Contact:    Dessa Phi, RN Phone Number: 01/25/2021, 10:53 AM  Clinical Narrative: Spoke to ALF-[facility]-ALF rep Ivy-d/c plans to return. Left vm w/Son's on contact list for call back. Noted Palliative cons. PT cons.  3:31p- faxed fl2 to [place] response if received.               Expected Discharge Plan: Assisted Living Barriers to Discharge: Continued Medical Work up   Patient Goals and CMS Choice Patient states their goals for this hospitalization and ongoing recovery are:: return back to ALF-[facility] CMS Medicare.gov Compare Post Acute Care list provided to:: Patient Choice offered to / list presented to : Patient  Expected Discharge Plan and Services Expected Discharge Plan: Assisted Living   Discharge Planning Services: CM Consult Post Acute Care Choice: Resumption of Svcs/PTA Provider Living arrangements for the past 2 months: [place]                                      Prior Living Arrangements/Services Living arrangements for the past 2 months: [place] Lives with:: Self Patient language and need for interpreter reviewed:: Yes Do you feel safe going back to the place where you live?: Yes      Need for Family Participation in Patient Care: No (Comment) Care giver support system in place?: Yes (comment) Current home services: DME (rw) Criminal Activity/Legal Involvement Pertinent to Current Situation/Hospitalization: No - Comment as needed  Activities of Daily Living Home Assistive Devices/Equipment: Blood pressure cuff, Grab bars around toilet, Grab bars in shower, Hand-held shower hose, Nebulizer, Hospital bed, Walker (specify type) ADL Screening (condition at time of admission) Patient's cognitive ability  adequate to safely complete daily activities?: Yes Is the patient deaf or have difficulty hearing?: Yes Does the patient have difficulty seeing, even when wearing glasses/contacts?: Yes Does the patient have difficulty concentrating, remembering, or making decisions?: Yes Patient able to express need for assistance with ADLs?: Yes Does the patient have difficulty dressing or bathing?: Yes (secondary to worsening weakness and shortness of breath) Independently performs ADLs?: No (secondary to worsening weakness and shortness of breath) Communication: Independent Dressing (OT): Needs assistance Is this a change from baseline?: Change from baseline, expected to last >3 days Grooming: Needs assistance Is this a change from baseline?: Change from baseline, expected to last >3 days Feeding: Needs assistance Is this a change from baseline?: Change from baseline, expected to last >3 days Bathing: Needs assistance Is this a change from baseline?: Change from baseline, expected to last >3 days Toileting: Needs assistance Is this a change from baseline?: Change from baseline, expected to last >3days In/Out Bed: Needs assistance Is this a change from baseline?: Change from baseline, expected to last >3 days Walks in Home: Needs assistance Is this a change from baseline?: Change from baseline, expected to last >3 days Does the patient have difficulty walking or climbing stairs?: Yes (secondary to worsening weakness and shortness of breath) Weakness of Legs: Both Weakness of Arms/Hands: None  Permission Sought/Granted Permission sought to share information with : Case Manager Permission granted to share information with : Yes, Verbal Permission Granted  Share Information with NAME: Case Manager     Permission granted to  share info w Relationship: [facility] rep Karlene Einstein     Emotional Assessment Appearance:: Appears stated age Attitude/Demeanor/Rapport: Gracious Affect (typically  observed): Accepting Orientation: : Oriented to Self, Oriented to Place, Oriented to  Time, Oriented to Situation Alcohol / Substance Use: Alcohol Use (documented-drinks wine) Psych Involvement: No (comment)  Admission diagnosis:  Hyperkalemia [E87.5] ARF (acute renal failure) (HCC) [N17.9] Pain [R52] Elevated troponin [R77.8] AKI (acute kidney injury) ([place]) [N17.9] Community acquired pneumonia, unspecified laterality [J18.9] Patient Active Problem List   Diagnosis Date Noted   ARF (acute renal failure) ([place]) 01/25/2021   SOB (shortness of breath) 01/24/2021   Leukocytosis 01/24/2021   AKI (acute kidney injury) ([place]) 01/24/2021   Fall 08/23/2020   Gait abnormality 08/23/2020   Left breast mass 05/20/2020   Posterior vitreous detachment of both eyes 04/26/2020   Gout 12/16/2019   Fracture dislocation of right wrist 12/11/2019   Advanced nonexudative age-related macular degeneration of left eye with subfoveal involvement 12/02/2019   Edema of both lower extremities due to peripheral venous insufficiency 10/30/2019   Blepharitis of lower eyelids of both eyes 07/10/2019   Leg skin lesion, right 07/10/2019   Exudative age-related macular degeneration of right eye with active choroidal neovascularization ([place]) 06/12/2019   Exudative age-related macular degeneration of left eye with inactive choroidal neovascularization ([place]) 06/12/2019   Early stage nonexudative age-related macular degeneration of right eye 06/12/2019   Generalized osteoarthritis of multiple sites 04/24/2019   CKD (chronic kidney disease) stage 3, GFR 30-59 ml/min (HCC) 04/24/2019   Neuropathy 04/24/2019   Urinary frequency 04/24/2019   Baker's cyst, left 04/24/2019   PAF (paroxysmal atrial fibrillation) ([place]) 01/09/2018   Mixed hyperlipidemia 01/09/2018   Macular degeneration 07/02/2014   Hypothyroidism 01/07/2014   Benign paroxysmal positional vertigo 03/28/2013   Atrioventricular block, complete ([place]) 07/23/2012    Hearing loss 05/26/2011   Essential hypertension 10/25/2010   Osteoporosis 10/25/2010   Melanoma of back ([place]) 10/25/2010   Carotid bruit 10/25/2010   Hyperlipidemia    Hiatal hernia    Diverticulosis    Pacemaker 04/01/2004   PCP:  Mast, Man X, NP Pharmacy:   [place], [place] [address] Phone: [phone] Fax: [place], [place] [address] [place] [address] Phone: [phone] Fax: [phone]     Social Determinants of Health (SDOH) Interventions    Readmission Risk Interventions No flowsheet data found.

## 2021-01-25 NOTE — H&P (Signed)
History and Physical        Hospital Admission Note Date: 01/25/2021  Patient name: [name] [name] Medical record number: [id number] Date of birth: [birth year] Age: 87 y.o. Gender: female  PCP: Mast, Man X, NP    Patient coming from: [facility]  I have reviewed all records in the [facility].    Chief Complaint:  Shortness of breath, abdominal distention   HPI: Patient is 87 year old female with history of hypertension, osteoporosis, pacemaker, breast CVA, CKD stage IIIb, hypothyroidism presented from SNF due to shortness of breath, abdominal distention, abnormal labs.  History was obtained from the patient, transfer records and from patient's son (HPOA).  Patient was being treated with Lasix for abdominal distention for 4 days and leg swelling.  Patient was found to be short of breath, coughing, abdominal distention.  She was found to be hypoxic with O2 sats in upper 80s, was placed on 2 L O2 via [place]. No chest pain, abdominal pain, nausea or vomiting.  No fevers reported No lower extremity swelling noted on exam   ED work-up/course:  In ED, temp noted to be 97.4, respiratory 30, heart rate 64, BP 128/69 O2 sat 91%,  on 3 L CMET showed sodium 136, potassium 5.8, BUN 89, creatinine 3.34, CO2 17, anion gap 19 Alk phos 147, AST 182, ALT 229, total bilirubin 2.5 BNP 631.8, troponin 257 Lactic acid 2.9 WBCs 28.0, hemoglobin 14.9, platelets 200 COVID-19, flu A,B negative  Labs at facility on 11/28: WBCs 27.9, hemoglobin 14.8 CMET: Alk phos 160, AST 360, ALT 271, total bilirubin 1.9.  Creatinine 2.79, bicarb 15, potassium 5.4, sodium 136  CXR: Ill-defined airspace opacities at the bilateral lung bases compatible with multifocal pneumonia and/or asymmetric pulmonary edema, small bilateral pleural effusions, borderline cardiomegaly  EKG: QTC 508, rate 60, atrial  sensed, ventricular paced, LVH   Review of Systems: Positives marked in 'bold' Constitutional: Denies fever, chills, diaphoresis, poor appetite and fatigue.  HEENT: Denies photophobia, eye pain, redness, hearing loss, ear pain, congestion, sore throat, rhinorrhea, sneezing, mouth sores, trouble swallowing, neck pain, neck stiffness and tinnitus.   Respiratory: Please see HPI Cardiovascular: Denies chest pain, palpitations and +leg swelling at SNF.  Gastrointestinal: Denies nausea, vomiting, abdominal pain, diarrhea, constipation, blood in stool and abdominal distention.  Genitourinary: Denies dysuria, urgency, frequency, hematuria, flank pain Musculoskeletal: Denies myalgias, back pain, joint swelling, arthralgias  Skin: Denies pallor, rash and wound.  Neurological: + Generalized weakness Hematological: Denies easy bruising, personal or family bleeding history  Psychiatric/Behavioral: No acute changes  Past Medical History: Past Medical History:  Diagnosis Date   Breast cancer ([place])    Cardiac pacemaker in situ 04/01/2004   Original implant 04/01/04 Dr. Doreatha Lew Indications syncope with Mobitz 2 heart block  RV lead Guidant 4470 52 cm bipolar lead, SN [id number] atrial lead Guidant bipolar serial #[id number].  Medtronic EnPulse Model number [id number], SN PNB [id number]    Carotid bruit 10/25/2010   right   Diverticulosis    H/O syncope    Hearing loss in right ear 05/26/2011   Hiatal hernia    HTN (hypertension), benign    Hyperlipidemia    Melanoma of back (  [place]) 2009   Mobitz II    Osteoporosis     Past Surgical History: Past Surgical History:  Procedure Laterality Date   BREAST BIOPSY  1960   Left   BREAST LUMPECTOMY  1960   left 2017   BREAST LUMPECTOMY WITH RADIOACTIVE SEED LOCALIZATION Left 09/10/2015   Procedure: BREAST LUMPECTOMY WITH RADIOACTIVE SEED LOCALIZATION;  Surgeon: Excell Seltzer, MD;  Location: [facility];  Service: General;  Laterality: Left;   CATARACT EXTRACTION  Bilateral 2002   Dr. Katy Fitch   CATARACT EXTRACTION, BILATERAL  2001   INSERT / REPLACE / REMOVE PACEMAKER     LOM HEMOTYMPANUM THROAT & SINUS BX  1977   PACEMAKER GENERATOR CHANGE N/A 08/16/2012   Procedure: PACEMAKER GENERATOR CHANGE;  Surgeon: Deboraha Sprang, MD;  Location: [facility] CATH LAB;  Service: Cardiovascular;  Laterality: N/A;   PACEMAKER INSERTION  2006   Medtronic DDDR [id number], SERIAL #  [id number]   surgery of ear      Medications: Prior to Admission medications   Medication Sig Start Date End Date Taking? Authorizing Provider  acetaminophen (TYLENOL) 500 MG tablet Take 500 mg by mouth every 6 (six) hours as needed for mild pain.   Yes [provider]  beta carotene w/minerals (OCUVITE) tablet Take 1 tablet by mouth daily.   Yes [provider]  Cholecalciferol (VITAMIN D-3) 1000 units CAPS Take 1,000 Units by mouth daily.   Yes [provider]  furosemide (LASIX) 20 MG tablet Take 20 mg by mouth daily.   Yes [provider]  ipratropium-albuterol (DUONEB) 0.5-2.5 (3) MG/3ML SOLN Take 3 mLs by nebulization every 8 (eight) hours as needed.   Yes [provider]  levothyroxine (SYNTHROID) 50 MCG tablet TAKE 1 TABLET ONCE DAILY. Patient taking differently: Take 50 mcg by mouth daily before breakfast. 06/10/20  Yes Mast, Man X, NP  lisinopril (ZESTRIL) 20 MG tablet Take 1 tablet (20 mg total) by mouth daily. 05/20/20  Yes Mast, Man X, NP  loperamide (IMODIUM A-D) 2 MG tablet Take 2 mg by mouth daily as needed for diarrhea or loose stools.   Yes [provider]  metoprolol succinate (TOPROL-XL) 25 MG 24 hr tablet TAKE 1 TABLET ONCE DAILY. Patient taking differently: 25 mg daily. 05/03/20  Yes Hilty, Nadean Corwin, MD  potassium chloride SA (KLOR-CON) 20 MEQ tablet Take 0.5 tablets (10 mEq total) by mouth every other day. 08/05/20  Yes Mast, Man X, NP    Allergies:   Allergies  Allergen Reactions   Adhesive [Tape] Itching and Rash    Please  use "paper" tape  NOT DOCUMENTED ON MAR   Codeine Nausea Only   Triamterene-Hctz Other (See Comments)    fatigue   Vasotec Diarrhea    Social History:  reports that she quit smoking about 74 years ago. Her smoking use included cigarettes. She has a 3.30 pack-year smoking history. She has never used smokeless tobacco. She reports current alcohol use of about 7.0 standard drinks per week. She reports that she does not use drugs.  Family History: Family History  Problem Relation Age of Onset   Stroke Mother 29   Kidney disease Father 57   Cirrhosis Son     Physical Exam: Blood pressure 128/69, pulse 64, temperature (!) 97.4 F (36.3 C), temperature source Oral, resp. rate (!) 21, height _0  (1.6 m), weight 61.5 kg, SpO2 97 %. General: Alert, awake, oriented x3, in no acute distress, hearing deficit. Eyes: pink conjunctiva,anicteric sclera, pupils  equal and reactive to light and accomodation, HEENT: normocephalic, atraumatic, oropharynx clear Neck: supple, no masses or lymphadenopathy, no goiter, no bruits, no JVD CVS: Regular rate and rhythm, bibasilar crackles resp : Clear to auscultation bilaterally, no wheezing, rales or rhonchi. GI : Soft, nontender, distended positive bowel sounds. No hepatomegaly. No hernia.  Musculoskeletal: No clubbing or cyanosis, positive pedal pulses. No contracture. ROM intact  Neuro: Grossly intact, no focal neurological deficits, strength 5/5 upper and lower extremities bilaterally Psych: alert and oriented x 3, normal mood and affect Skin: no rashes or lesions, warm and dry   LABS on Admission: I have personally reviewed all the labs and imagings below    Basic Metabolic Panel: Recent Labs  Lab 01/24/21 2225  NA 136  K 5.8*  CL 100  CO2 17*  GLUCOSE 101*  BUN 99*  CREATININE 3.34*  CALCIUM 8.9   Liver Function Tests: Recent Labs  Lab 01/24/21 2225  AST 182*  ALT 229*  ALKPHOS 147*  BILITOT 2.5*  PROT 6.8  ALBUMIN 3.3*    Recent Labs  Lab 01/24/21 2225  LIPASE 42   No results for input(s): AMMONIA in the last 168 hours. CBC: Recent Labs  Lab 01/24/21 1808  WBC 28.0*  NEUTROABS 24.3*  HGB 14.9  HCT 45.4  MCV 88.0  PLT 200   Cardiac Enzymes: No results for input(s): CKTOTAL, CKMB, CKMBINDEX, TROPONINI in the last 168 hours. BNP: Invalid input(s): POCBNP CBG: Recent Labs  Lab 01/25/21 0103  GLUCAP 205*    Radiological Exams on Admission:  DG Chest Port 1 View  Result Date: 01/24/2021 CLINICAL DATA:  Shortness of breath, weakness. EXAM: PORTABLE CHEST 1 VIEW COMPARISON:  Chest x-ray dated 07/21/2008. FINDINGS: Ill-defined airspace opacities at the bilateral lung bases. Additional interstitial prominence within the lower lung zones bilaterally, RIGHT greater than LEFT. Probable small bilateral pleural effusions. No pneumothorax is seen. Borderline cardiomegaly. LEFT chest wall pacemaker/ICD apparatus in place. Osseous structures about the chest are unremarkable. IMPRESSION: 1. Ill-defined airspace opacities at the bilateral lung bases, compatible with multifocal pneumonia and/or asymmetric pulmonary edema. 2. Probable small bilateral pleural effusions. 3. Borderline cardiomegaly. Electronically Signed   By: Franki Cabot M.D.   On: 01/24/2021 21:50   CT CHEST ABDOMEN PELVIS WO CONTRAST  Result Date: 01/25/2021 CLINICAL DATA:  Abdominal distension EXAM: CT CHEST, ABDOMEN AND PELVIS WITHOUT CONTRAST TECHNIQUE: Multidetector CT imaging of the chest, abdomen and pelvis was performed following the standard protocol without IV contrast. COMPARISON:  08/08/2006 FINDINGS: CT CHEST FINDINGS Cardiovascular: Calcific aortic atherosclerosis. Left chest wall pacemaker. No pericardial effusion. Mediastinum/Nodes: Multilobular mass of the left breast measuring 3.7 x 4.2 cm. There is no mediastinal or axillary lymphadenopathy. Lungs/Pleura: Bibasilar interstitial opacity with small right pleural effusion.  Musculoskeletal: No acute osseous abnormality. CT ABDOMEN PELVIS FINDINGS Hepatobiliary: No focal liver abnormality is seen. No gallstones, gallbladder wall thickening, or biliary dilatation. Pancreas: Unremarkable. No pancreatic ductal dilatation or surrounding inflammatory changes. Spleen: Normal in size without focal abnormality. Adrenals/Urinary Tract: Normal adrenal glands. Bilateral renal atrophy with multiple cysts. Stomach/Bowel: There is rectosigmoid diverticulosis without inflammation. No small bowel dilatation. Appendix is normal. Nondistended stomach with small hiatal hernia. Vascular/Lymphatic: Aortic atherosclerosis. No enlarged abdominal or pelvic lymph nodes. Reproductive: Uterus is normal.  No adnexal mass. Other: No abdominal wall hernia or abnormality. No abdominopelvic ascites. Musculoskeletal: No acute or significant osseous findings. IMPRESSION: 1. No acute abnormality of the chest, abdomen or pelvis. 2. Multilobular mass of the left breast, concerning  for breast carcinoma. Correlation with mammography and ultrasound are recommended as an outpatient. 3. Bibasilar interstitial opacity with small right pleural effusion, likely pulmonary edema. Aortic Atherosclerosis (ICD10-I70.0). Electronically Signed   By: Ulyses Jarred M.D.   On: 01/25/2021 00:42      EKG: Independently reviewed.  EKG: QTC 508, rate 60, atrial sensed, ventricular paced, LVH   Assessment/Plan Principal Problem: Acute respiratory failure with hypoxia, multifocal pneumonia versus acute CHF/pulmonary edema, fluid overload -Patient presented with BNP of 631.8, shortness of breath, cough, hypoxia, currently on 3 L O2, chest x-ray with multifocal pneumonia and/or asymmetric pulmonary edema - CT chest abdomen pelvis showed bibasilar interstitial opacity with small right pleural effusion likely pulmonary edema, multilobular mass of the left breast concerning for breast CA -Patient was placed on IV Zithromax, Rocephin,  will continue for now -Goals of care addressed with patient's son, HPOA Mr. Sharay Bellissimo, overall poor prognosis with advanced age, multiple acute medical issues, including renal failure, fluid overload/multifocal pneumonia, elevated LFTs, troponin/BNP.  He requested palliation and comfort care status, no aggressive care and return to SNF in a.m. -Palliative consult placed.   -For now only continue antibiotics, nebs, O2 and comfort medications, discontinued further labs, fluids or imaging  Acute kidney injury on CKD stage IIIb, hyperkalemia, anion gap metabolic acidosis, lactic acidosis -Patient was receiving extra Lasix at the facility for lower extremity swelling, abdominal distention, lisinopril 20 mg daily, potassium 10 mg daily -Patient has received insulin, D50, Lokelma in ED -Comfort care status as per goals of care above  Acute transaminitis, abdominal distention/ascites -LFTs improving as in comparison to the labs drawn yesterday at the facility,  -Continue comfort care status, no further imaging/labs or aggressive measures including paracentesis per family  Left Breast Ca noted on CT imaging -Patient has a history of breast CVA, was mentioned as stage I breast Ca, had undergone lumpectomy by Dr. Excell Seltzer, in 2017  Hypothyroidism -Continue Synthroid as per outpatient dose  Hypertension -Continue Toprol-XL  History of Mobitz 2 AVB, status post pacemaker -Continue comfort care status  Paroxysmal atrial fibrillation -Rate controlled, pacemaker, continue metoprolol, not on any anticoagulation    DVT prophylaxis: SCDs  CODE STATUS: Per transfer records and my discussion with patient's son, HPOA, Mr Monisha Siebel, requested DNR status with comfort care measures  Consults called:   Family Communication: Admission, patients condition and plan of care including tests being ordered have been discussed with the patient and son, Mr. Tyja Gortney, HPOA who indicates understanding and  agree with the plan and Code Status  Admission status:   The medical decision making on this patient was of high complexity and the patient is at high risk for clinical deterioration, therefore this is a level 3 admission.  Severity of Illness:      The appropriate patient status for this patient is INPATIENT. Inpatient status is judged to be reasonable and necessary in order to provide the required intensity of service to ensure the patient's safety. The patient's presenting symptoms, physical exam findings, and initial radiographic and laboratory data in the context of their chronic comorbidities is felt to place them at high risk for further clinical deterioration. Furthermore, it is not anticipated that the patient will be medically stable for discharge from the hospital within 2 midnights of admission.   * I certify that at the point of admission it is my clinical judgment that the patient will require inpatient hospital care spanning beyond 2 midnights from the point of admission due to  high intensity of service, high risk for further deterioration and high frequency of surveillance required.*   Time Spent on Admission: 95mns      Valor Turberville M.D. [facility] 01/25/2021, 8:14 AM

## 2021-01-26 DIAGNOSIS — N179 Acute kidney failure, unspecified: Secondary | ICD-10-CM | POA: Diagnosis not present

## 2021-01-26 MED ORDER — OXYCODONE HCL 5 MG PO TABS: 2.5000 mg | ORAL_TABLET | Freq: Four times a day (QID) | ORAL | 0 refills | Status: AC | PRN

## 2021-01-26 NOTE — Progress Notes (Addendum)
Attempted to reach out to receiving facility again. Still no report for nurse receiving patient. [facility] is still in route to pick up patient

## 2021-01-26 NOTE — Progress Notes (Signed)
Attempted to reach receiving facility twice at 1245 & 1250, no answer. Will try again.

## 2021-01-26 NOTE — Discharge Summary (Addendum)
Physician Discharge Summary  [name] [name] JSH:[id number] DOB: May 13, 1916 DOA: 01/24/2021  PCP: Mast, Man X, NP  Admit date: 01/24/2021 Discharge date: 01/26/2021  Time spent: 40 minutes  Recommendations for Outpatient Follow-up:  Comfort measures per hospice at [facility]   Discharge Diagnoses:  Principal Problem:   ARF (acute renal failure) [facility])   Discharge Condition: stable  Diet recommendation: as tolerated  Filed Weights   01/24/21 1751 01/25/21 0309  Weight: 59 kg 61.5 kg    History of present illness:  87 yo F with hx HTN, osteoporosis, breast cancer, CKD stabge IIIb, hypothyroidism who presented from SNF with shortness of breath, abdominal distension and abnormali labs.  There was concern for pulmonary edema and/or pneumonia at presentation.  She also had evidence of multiorgan injury with AKI, elevated troponin, and liver injury.  She was treated with antibiotics.  Pearlie Nies decision was made for comfort measures based on discussion with admitting provider.  Confirmed today at discharge.   Hospital Course:  Goals of care Dr. Tana Coast discussed goc with patient/family, they decided on comfort measures.  She understands this on my review of the care plan with her today.  Also discussed with sons.  Acute Hypoxic Resp Failure 2/2 heart failure +/- pneumonia See below, comfort measures at this time  Sepsis with unclear source, suspected community acquired pneumonia Leukocytosis, hypoxia/tachypnea - with source likely pneumonia No UA.  Blood cultures currently no growth, but pending CT chest/abdomen/pelvis with bibasilar interstitial opacity with small right effusion, likely edema Ceftriaxone/azithro given for coverage Now comfort measures per discussion 11/29, confirmed today with pt/sons  Acute Heart Failure Exacerbation  Pulmonary Edema Also on ddx for presentation with swelling treated with lasix prior to admission  She had elevated BNP and CT with concern for pulm  edema Comfort measures as noted above  AKI on CKD IIIb  Hyperkalemia  Anion Gap Metabolic Acidosis  Lactic Acidosis In setting of lasix outpatient with lisinopril, potassium D/c lasix/lisinopril at discharge Also in setting of suspected infection/sepsis  Elevated Liver Enzymes Likely shock liver  Elevated Troponin ? Demand Comfort as above  Left Breast Cancer noted on CT imaging Hx breast cancer, s/p lumpectomy by Dr. Excell Seltzer in 2017 CT here with multilobular mass of L breast Plan for hospice as above  Hypothyroidism Synthroid  Hypertension Metoprolol  Mobitz 2 AVB s/p pacemaker Comfort measures  Atrial Fibrillation Continue metop  Procedures: none  Consultations: none  Discharge Exam: Vitals:   01/25/21 2040 01/26/21 0524  BP: (!) 107/56 119/77  Pulse: 68 72  Resp: 20 16  Temp: 98 F (36.7 C) 97.6 F (36.4 C)  SpO2: 97% 99%   She notes understanding of plan for hospice Sons at bedside note this was her decision Discussed discharge plan - discussed uncertainty with prognosis as well as diagnosis - they note understanding  Hasn't eaten much in past week  General: 87 yo female in no apparent distress Cardiovascular: RRR Lungs: unlabored on 2 L Abdomen: mildly distended Neurological: Alert and oriented 3. Moves all extremities 4. Cranial nerves II through XII grossly intact. Extremities: No clubbing or cyanosis. No edema.  Discharge Instructions   Discharge Instructions     Discharge instructions   Complete by: As directed    You were seen for findings concerning for heart failure and/or Authur Cubit severe infection with evidence of multiorgan injury.    We've made Ruey Storer decision to transition you to hospice with Yahel Fuston focus on your comfort.  Please ask your PCP to  request records from this hospitalization so they know what was done and what the next steps will be.   Increase activity slowly   Complete by: As directed       Allergies as of 01/26/2021        Reactions   Adhesive [tape] Itching, Rash   Please use "paper" tape NOT DOCUMENTED ON MAR   Codeine Nausea Only   Triamterene-hctz Other (See Comments)   fatigue   Vasotec Diarrhea        Medication List     STOP taking these medications    furosemide 20 MG tablet Commonly known as: LASIX   lisinopril 20 MG tablet Commonly known as: ZESTRIL   potassium chloride SA 20 MEQ tablet Commonly known as: KLOR-CON M       TAKE these medications    acetaminophen 500 MG tablet Commonly known as: TYLENOL Take 500 mg by mouth every 6 (six) hours as needed for mild pain.   beta carotene w/minerals tablet Take 1 tablet by mouth daily.   ipratropium-albuterol 0.5-2.5 (3) MG/3ML Soln Commonly known as: DUONEB Take 3 mLs by nebulization every 8 (eight) hours as needed.   levothyroxine 50 MCG tablet Commonly known as: SYNTHROID TAKE 1 TABLET ONCE DAILY. What changed: when to take this   loperamide 2 MG tablet Commonly known as: IMODIUM Chantrell Apsey-D Take 2 mg by mouth daily as needed for diarrhea or loose stools.   metoprolol succinate 25 MG 24 hr tablet Commonly known as: TOPROL-XL TAKE 1 TABLET ONCE DAILY. What changed: how to take this   oxyCODONE 5 MG immediate release tablet Commonly known as: Roxicodone Take 0.5 tablets (2.5 mg total) by mouth every 6 (six) hours as needed for up to 3 days.   Vitamin D-3 25 MCG (1000 UT) Caps Take 1,000 Units by mouth daily.       Allergies  Allergen Reactions   Adhesive [Tape] Itching and Rash    Please use "paper" tape  NOT DOCUMENTED ON MAR   Codeine Nausea Only   Triamterene-Hctz Other (See Comments)    fatigue   Vasotec Diarrhea    Contact information for after-discharge care     Destination     [facility] .   Service: Assisted Living Contact information: [place] [phone]                      The results of significant  diagnostics from this hospitalization (including imaging, microbiology, ancillary and laboratory) are listed below for reference.    Significant Diagnostic Studies: DG Chest Port 1 View  Result Date: 01/24/2021 CLINICAL DATA:  Shortness of breath, weakness. EXAM: PORTABLE CHEST 1 VIEW COMPARISON:  Chest x-ray dated 07/21/2008. FINDINGS: Ill-defined airspace opacities at the bilateral lung bases. Additional interstitial prominence within the lower lung zones bilaterally, RIGHT greater than LEFT. Probable small bilateral pleural effusions. No pneumothorax is seen. Borderline cardiomegaly. LEFT chest wall pacemaker/ICD apparatus in place. Osseous structures about the chest are unremarkable. IMPRESSION: 1. Ill-defined airspace opacities at the bilateral lung bases, compatible with multifocal pneumonia and/or asymmetric pulmonary edema. 2. Probable small bilateral pleural effusions. 3. Borderline cardiomegaly. Electronically Signed   By: Franki Cabot M.D.   On: 01/24/2021 21:50   Intravitreal Injection, Pharmacologic Agent - OD - Right Eye  Result Date: 12/28/2020 Time Out 12/28/2020. 10:57 AM. Confirmed correct patient, procedure, site, and patient consented. Anesthesia Topical anesthesia was used. Anesthetic medications included Akten  3.5%. Procedure Preparation included 10% betadine to eyelids, 5% betadine to ocular surface, Ofloxacin . Mayara Paulson 30 gauge needle was used. Injection: 2 mg aflibercept 2 MG/0.05ML   Route: Intravitreal, Site: Right Eye   NDC: [id number], Lot: [id number], Waste: 0 mL Post-op Post injection exam found visual acuity of at least counting fingers. The patient tolerated the procedure well. There were no complications. The patient received written and verbal post procedure care education. Post injection medications included ocuflox.   OCT, Retina - OU - Both Eyes  Result Date: 12/28/2020 Right Eye Quality was good. Scan locations included subfoveal. Central Foveal Thickness: 276.  Progression has been stable. Findings include abnormal foveal contour, retinal drusen , subretinal scarring, subretinal hyper-reflective material, no IRF. Left Eye Quality was good. Scan locations included subfoveal. Central Foveal Thickness: 286. Progression has been stable. Findings include abnormal foveal contour, central retinal atrophy, outer retinal atrophy. Notes Visual acuity preserved OD with less subretinal fluid today at 9-week follow-up.  We will repeat injection today and maintain 9-week interval follow-up exams   CT CHEST ABDOMEN PELVIS WO CONTRAST  Result Date: 01/25/2021 CLINICAL DATA:  Abdominal distension EXAM: CT CHEST, ABDOMEN AND PELVIS WITHOUT CONTRAST TECHNIQUE: Multidetector CT imaging of the chest, abdomen and pelvis was performed following the standard protocol without IV contrast. COMPARISON:  08/08/2006 FINDINGS: CT CHEST FINDINGS Cardiovascular: Calcific aortic atherosclerosis. Left chest wall pacemaker. No pericardial effusion. Mediastinum/Nodes: Multilobular mass of the left breast measuring 3.7 x 4.2 cm. There is no mediastinal or axillary lymphadenopathy. Lungs/Pleura: Bibasilar interstitial opacity with small right pleural effusion. Musculoskeletal: No acute osseous abnormality. CT ABDOMEN PELVIS FINDINGS Hepatobiliary: No focal liver abnormality is seen. No gallstones, gallbladder wall thickening, or biliary dilatation. Pancreas: Unremarkable. No pancreatic ductal dilatation or surrounding inflammatory changes. Spleen: Normal in size without focal abnormality. Adrenals/Urinary Tract: Normal adrenal glands. Bilateral renal atrophy with multiple cysts. Stomach/Bowel: There is rectosigmoid diverticulosis without inflammation. No small bowel dilatation. Appendix is normal. Nondistended stomach with small hiatal hernia. Vascular/Lymphatic: Aortic atherosclerosis. No enlarged abdominal or pelvic lymph nodes. Reproductive: Uterus is normal.  No adnexal mass. Other: No abdominal wall  hernia or abnormality. No abdominopelvic ascites. Musculoskeletal: No acute or significant osseous findings. IMPRESSION: 1. No acute abnormality of the chest, abdomen or pelvis. 2. Multilobular mass of the left breast, concerning for breast carcinoma. Correlation with mammography and ultrasound are recommended as an outpatient. 3. Bibasilar interstitial opacity with small right pleural effusion, likely pulmonary edema. Aortic Atherosclerosis (ICD10-I70.0). Electronically Signed   By: Ulyses Jarred M.D.   On: 01/25/2021 00:42    Microbiology: Recent Results (from the past 240 hour(s))  Resp Panel by RT-PCR (Flu Deryck Hippler&B, Covid) Nasopharyngeal Swab     Status: None   Collection Time: 01/24/21  7:05 PM   Specimen: Nasopharyngeal Swab; Nasopharyngeal(NP) swabs in vial transport medium  Result Value Ref Range Status   SARS Coronavirus 2 by RT PCR NEGATIVE NEGATIVE Final    Comment: (NOTE) SARS-CoV-2 target nucleic acids are NOT DETECTED.  The SARS-CoV-2 RNA is generally detectable in upper respiratory specimens during the acute phase of infection. The lowest concentration of SARS-CoV-2 viral copies this assay can detect is 138 copies/mL. Agostino Gorin negative result does not preclude SARS-Cov-2 infection and should not be used as the sole basis for treatment or other patient management decisions. Burnett Spray negative result may occur with  improper specimen collection/handling, submission of specimen other than nasopharyngeal swab, presence of viral mutation(s) within the areas targeted by this assay, and inadequate number of  viral copies(<138 copies/mL). Dorance Spink negative result must be combined with clinical observations, patient history, and epidemiological information. The expected result is Negative.  Fact Sheet for Patients:  EntrepreneurPulse.com.au  Fact Sheet for Healthcare Providers:  IncredibleEmployment.be  This test is no t yet approved or cleared by the [place] FDA and   has been authorized for detection and/or diagnosis of SARS-CoV-2 by FDA under an Emergency Use Authorization (EUA). This EUA will remain  in effect (meaning this test can be used) for the duration of the COVID-19 declaration under Section 564(b)(1) of the Act, 21 U.S.C.section 360bbb-3(b)(1), unless the authorization is terminated  or revoked sooner.       Influenza Havish Petties by PCR NEGATIVE NEGATIVE Final   Influenza B by PCR NEGATIVE NEGATIVE Final    Comment: (NOTE) The Xpert Xpress SARS-CoV-2/FLU/RSV plus assay is intended as an aid in the diagnosis of influenza from Nasopharyngeal swab specimens and should not be used as Decklin Weddington sole basis for treatment. Nasal washings and aspirates are unacceptable for Xpert Xpress SARS-CoV-2/FLU/RSV testing.  Fact Sheet for Patients: EntrepreneurPulse.com.au  Fact Sheet for Healthcare Providers: IncredibleEmployment.be  This test is not yet approved or cleared by the [place] FDA and has been authorized for detection and/or diagnosis of SARS-CoV-2 by FDA under an Emergency Use Authorization (EUA). This EUA will remain in effect (meaning this test can be used) for the duration of the COVID-19 declaration under Section 564(b)(1) of the Act, 21 U.S.C. section 360bbb-3(b)(1), unless the authorization is terminated or revoked.  Performed at [facility], [place] [address] [place], [place] [address]   Culture, blood (Routine X 2) w Reflex to ID Panel     Status: None (Preliminary result)   Collection Time: 01/25/21 12:37 AM   Specimen: BLOOD  Result Value Ref Range Status   Specimen Description   Final    BLOOD LEFT ANTECUBITAL Performed at [facility] [address], [place], [place] [address]    Special Requests   Final    BOTTLES DRAWN AEROBIC AND ANAEROBIC Blood Culture adequate volume Performed at [facility] [address], [place], [place]  [address]    Culture   Final    NO GROWTH 1 DAY Performed at [facility], [place] [address], [place], [place] [address]    Report Status PENDING  Incomplete  Culture, blood (Routine X 2) w Reflex to ID Panel     Status: None (Preliminary result)   Collection Time: 01/25/21  2:20 AM   Specimen: BLOOD RIGHT FOREARM  Result Value Ref Range Status   Specimen Description   Final    BLOOD RIGHT FOREARM Performed at [facility], [place] [address], [place], [place] [address]    Special Requests   Final    BOTTLES DRAWN AEROBIC AND ANAEROBIC Blood Culture adequate volume Performed at [facility] [address] [place], [place] [address]    Culture   Final    NO GROWTH 1 DAY Performed at [facility], [place] [address], [place], [place] [address]    Report Status PENDING  Incomplete     Labs: Basic Metabolic Panel: Recent Labs  Lab 01/24/21 2225  NA 136  K 5.8*  CL 100  CO2 17*  GLUCOSE 101*  BUN 99*  CREATININE 3.34*  CALCIUM 8.9   Liver Function Tests: Recent Labs  Lab 01/24/21 2225  AST 182*  ALT 229*  ALKPHOS 147*  BILITOT 2.5*  PROT 6.8  ALBUMIN 3.3*   Recent Labs  Lab 01/24/21 2225  LIPASE 42   No results for input(s): AMMONIA in the last 168 hours. CBC: Recent Labs  Lab 01/24/21 1808  WBC 28.0*  NEUTROABS 24.3*  HGB 14.9  HCT 45.4  MCV 88.0  PLT 200   Cardiac Enzymes: No results for input(s): CKTOTAL, CKMB, CKMBINDEX, TROPONINI in the last 168 hours. BNP: BNP (last 3 results) Recent Labs    01/24/21 1945  BNP 631.8*    ProBNP (last 3 results) No results for input(s): PROBNP in the last 8760 hours.  CBG: Recent Labs  Lab 01/25/21 0103  GLUCAP 205*       Signed:  Fayrene Helper MD.  [facility] 01/26/2021, 10:44 AM

## 2021-01-26 NOTE — Progress Notes (Signed)
[facility]  Manufacturing engineer [facility]) Hospital Liaison Note  Spoke with Mr. Bradway to initiate education related to hospice philosophy, services, and team approach to care. Patient/family verbalized understanding of information given. Per discussion, the plan is for patient to discharge home via [facility] today.    Please send signed and completed DNR home with patient/family. Please provide prescriptions at discharge as needed to ensure ongoing symptom management.     Please call with any questions/concerns.    Thank you for the opportunity to participate in this patient's care.  Zollie Pee [facility] Liaison  [phone]

## 2021-01-26 NOTE — Progress Notes (Signed)
Called [phone] [facility]) to give report-No answer, left voice mail.

## 2021-01-26 NOTE — TOC Transition Note (Signed)
Transition of Care [facility]) - CM/SW Discharge Note   Patient Details  Name: [name] [name] MRN: [id number] Date of Birth: Oct 05, 1916  Transition of Care [facility]) CM/SW Contact:  Dessa Phi, RN Phone Number: 01/26/2021, 11:39 AM   Clinical Narrative: d/c back to [place] w/hospice services-[facility] referral informed via voicemail w/contact person Zenia Resides to talk to. D/c summary sent.going to rm [phone],nsg call report [phone].[facility] called for transport. No further CM needs.     Final next level of care: Assisted Living Barriers to Discharge: No Barriers Identified   Patient Goals and CMS Choice Patient states their goals for this hospitalization and ongoing recovery are:: return back to ALF-[facility] CMS Medicare.gov Compare Post Acute Care list provided to:: Patient Choice offered to / list presented to : Patient  Discharge Placement              Patient chooses bed at: Other - please specify in the comment section below: ([facility]) Patient to be transferred to facility by: [place] Name of family member notified: Zenia Resides son [phone] Patient and family notified of of transfer: 01/26/21  Discharge Plan and Services   Discharge Planning Services: CM Consult Post Acute Care Choice: Resumption of Svcs/PTA Provider                               Social Determinants of Health (SDOH) Interventions     Readmission Risk Interventions No flowsheet data found.

## 2021-01-26 NOTE — TOC Progression Note (Signed)
Transition of Care [facility]) - Progression Note    Patient Details  Name: [name] [name] MRN: [id number] Date of Birth: [birth year]  Transition of Care [facility]) CM/SW Contact  Abriel Geesey, Juliann Pulse, RN Phone Number: 01/26/2021, 10:37 AM  Clinical Narrative:  Awaiting d/c summary to send to ALF w/hospice services(facility will arrange). [facility] for transport.     Expected Discharge Plan: Assisted Living Barriers to Discharge: Continued Medical Work up  Expected Discharge Plan and Services Expected Discharge Plan: Assisted Living   Discharge Planning Services: CM Consult Post Acute Care Choice: Resumption of Svcs/PTA Provider Living arrangements for the past 2 months: [place] Expected Discharge Date: 01/26/21                                     Social Determinants of Health (SDOH) Interventions    Readmission Risk Interventions No flowsheet data found.

## 2021-01-27 ENCOUNTER — Encounter: Payer: Self-pay | Admitting: Nurse Practitioner

## 2021-01-27 ENCOUNTER — Non-Acute Institutional Stay (SKILLED_NURSING_FACILITY): Payer: Medicare Other | Admitting: Nurse Practitioner

## 2021-01-27 DIAGNOSIS — J189 Pneumonia, unspecified organism: Secondary | ICD-10-CM | POA: Diagnosis not present

## 2021-01-27 DIAGNOSIS — I10 Essential (primary) hypertension: Secondary | ICD-10-CM | POA: Diagnosis not present

## 2021-01-27 DIAGNOSIS — N179 Acute kidney failure, unspecified: Secondary | ICD-10-CM

## 2021-01-27 DIAGNOSIS — N6320 Unspecified lump in the left breast, unspecified quadrant: Secondary | ICD-10-CM

## 2021-01-27 DIAGNOSIS — R0902 Hypoxemia: Secondary | ICD-10-CM | POA: Diagnosis not present

## 2021-01-27 DIAGNOSIS — I480 Paroxysmal atrial fibrillation: Secondary | ICD-10-CM

## 2021-01-27 DIAGNOSIS — D72829 Elevated white blood cell count, unspecified: Secondary | ICD-10-CM

## 2021-01-27 DIAGNOSIS — R748 Abnormal levels of other serum enzymes: Secondary | ICD-10-CM | POA: Insufficient documentation

## 2021-01-27 DIAGNOSIS — I509 Heart failure, unspecified: Secondary | ICD-10-CM | POA: Diagnosis not present

## 2021-01-27 DIAGNOSIS — E039 Hypothyroidism, unspecified: Secondary | ICD-10-CM

## 2021-01-27 DIAGNOSIS — Z950 Presence of cardiac pacemaker: Secondary | ICD-10-CM

## 2021-01-27 NOTE — Assessment & Plan Note (Signed)
Heart rate is in control, continue Metoprolol, avoid anticoagulation

## 2021-01-27 NOTE — Assessment & Plan Note (Signed)
Hospitalized 01/24/21-01/26/21, PNA-CT chest/abdomen/pelvis with bibasilar interstitial opacity with small right effusion-treated with antibiotics.

## 2021-01-27 NOTE — Assessment & Plan Note (Addendum)
Hypoxic respiratory failure, due to CHF/PNA, on O2, prn Oxycodone available. Morphine, Lorazepam prn available to her. Dc Vit D, Ocuvite. Hospice service for comfort measures.

## 2021-01-27 NOTE — Assessment & Plan Note (Signed)
Bun/creat K 5.8, Bun 99, creat 3.34 01/24/21

## 2021-01-27 NOTE — Assessment & Plan Note (Signed)
Elevate liver enzymes AST 182, ALT 229 total bilirubin 2.5 01/24/21

## 2021-01-27 NOTE — Assessment & Plan Note (Signed)
Sepsis

## 2021-01-27 NOTE — Assessment & Plan Note (Signed)
eft breast multilobular mass on CT. Hx of breast cancer, s/p lumpectomy 2017 Dr. Excell Seltzer

## 2021-01-27 NOTE — Assessment & Plan Note (Signed)
CHF/Edema BLE-not apparent, off Furosemide. Elevated BNP 632 01/24/21 CT with pulmonary edema.

## 2021-01-27 NOTE — Assessment & Plan Note (Signed)
AVB Mobitz 2, s/p pacemaker.

## 2021-01-27 NOTE — Assessment & Plan Note (Signed)
Blood pressure is controlled, on Metoprolol. Bun/creat 35/1.3 12/21/20

## 2021-01-27 NOTE — Progress Notes (Signed)
Location:   [facility] Room Number: 811 Place of Service:  ALF (13) Provider: Lennie Odor Alissandra Geoffroy NP  Moishy Laday X, NP  Patient Care Team: Ruie Sendejo X, NP as PCP - General (Internal Medicine) Debara Pickett Nadean Corwin, MD as PCP - Cardiology (Cardiology)  Extended Emergency Contact Information Primary Emergency Contact: Anastasia Fiedler, [place] Phone: [phone] Relation: Son Secondary Emergency Contact: [place] Phone: [phone] Relation: Son  Code Status: DNR Goals of care: Advanced Directive information Advanced Directives 01/24/2021  Does Patient Have a Medical Advance Directive? Yes  Type of Advance Directive Out of facility DNR (pink MOST or yellow form)  Does patient want to make changes to medical advance directive? No - Patient declined  Copy of [place] in Chart? -  Pre-existing out of facility DNR order (yellow form or pink MOST form) -     Chief Complaint  Patient presents with   Acute Visit    Medication review following hospitalization    HPI:  Pt is a 87 y.o. female seen today for an acute visit for medication review following hospitalization.   Hospitalized 01/24/21-01/26/21, PNA-CT chest/abdomen/pelvis with bibasilar interstitial opacity with small right effusion-treated with antibiotics. AKI,  Sepsis-wbc 28 01/24/21, enrolled in Hospice for comfort measures.   Hypoxic respiratory failure, due to CHF/PNA, on O2, prn Oxycodone available.   CHF/Edema BLE-not apparent, off Furosemide. Elevated BNP 632 01/24/21 CT with pulmonary edema.   Elevate liver enzymes AST 182, ALT 229 total bilirubin 2.5 01/24/21             OA, multiple sites, takes Tylenol.               HTN, on Metoprolol. Bun/creat 35/1.3 12/21/20             Hypothyroidism, stable, on Levothyroxine 95mcg qd. TSH 2.764 01/23/21             CKD Bun/creat K 5.8, Bun 99, creat 3.34 01/24/21             Gout asymptomatic, uric  acid 7.2 06/10/20             Gait abnormality, need w/c due to increased frailty.              AVB Mobitz 2, s/p pacemaker.              PAF continue Metoprolol, avoid anticoagulation   Breast mass, left breast multilobular mass on CT. Hx of breast cancer, s/p lumpectomy 2017 Dr. Excell Seltzer  Past Medical History:  Diagnosis Date   Breast cancer [facility])    Cardiac pacemaker in situ 04/01/2004   Original implant 04/01/04 Dr. Doreatha Lew Indications syncope with Mobitz 2 heart block  RV lead Guidant 4470 52 cm bipolar lead, SN [id number] atrial lead Guidant bipolar serial #[id number].  Medtronic EnPulse Model number [id number], SN PNB [id number]    Carotid bruit 10/25/2010   right   Diverticulosis    H/O syncope    Hearing loss in right ear 05/26/2011   Hiatal hernia    HTN (hypertension), benign    Hyperlipidemia    Melanoma of back ([place]) 2009   Mobitz II    Osteoporosis    Past Surgical History:  Procedure Laterality Date   BREAST BIOPSY  1960   Left   BREAST LUMPECTOMY  1960   left 2017   BREAST LUMPECTOMY WITH RADIOACTIVE SEED LOCALIZATION Left 09/10/2015  Procedure: BREAST LUMPECTOMY WITH RADIOACTIVE SEED LOCALIZATION;  Surgeon: Excell Seltzer, MD;  Location: [facility];  Service: General;  Laterality: Left;   CATARACT EXTRACTION Bilateral 2002   Dr. Katy Fitch   CATARACT EXTRACTION, BILATERAL  2001   INSERT / REPLACE / REMOVE PACEMAKER     LOM HEMOTYMPANUM THROAT & SINUS BX  1977   PACEMAKER GENERATOR CHANGE N/A 08/16/2012   Procedure: PACEMAKER GENERATOR CHANGE;  Surgeon: Deboraha Sprang, MD;  Location: [facility] CATH LAB;  Service: Cardiovascular;  Laterality: N/A;   PACEMAKER INSERTION  2006   Medtronic DDDR [id number], SERIAL #  [id number]   surgery of ear      Allergies  Allergen Reactions   Adhesive [Tape] Itching and Rash    Please use "paper" tape  NOT DOCUMENTED ON MAR   Codeine Nausea Only   Triamterene-Hctz Other (See Comments)    fatigue   Vasotec Diarrhea    Allergies as of 01/27/2021        Reactions   Adhesive [tape] Itching, Rash   Please use "paper" tape NOT DOCUMENTED ON MAR   Codeine Nausea Only   Triamterene-hctz Other (See Comments)   fatigue   Vasotec Diarrhea        Medication List        Accurate as of January 27, 2021 11:59 PM. If you have any questions, ask your nurse or doctor.          acetaminophen 500 MG tablet Commonly known as: TYLENOL Take 500 mg by mouth every 6 (six) hours as needed for mild pain.   beta carotene w/minerals tablet Take 1 tablet by mouth daily.   ipratropium-albuterol 0.5-2.5 (3) MG/3ML Soln Commonly known as: DUONEB Take 3 mLs by nebulization every 8 (eight) hours as needed.   levothyroxine 50 MCG tablet Commonly known as: SYNTHROID TAKE 1 TABLET ONCE DAILY. What changed: when to take this   loperamide 2 MG tablet Commonly known as: IMODIUM A-D Take 2 mg by mouth daily as needed for diarrhea or loose stools.   metoprolol succinate 25 MG 24 hr tablet Commonly known as: TOPROL-XL TAKE 1 TABLET ONCE DAILY. What changed: how to take this   oxyCODONE 5 MG immediate release tablet Commonly known as: Roxicodone Take 0.5 tablets (2.5 mg total) by mouth every 6 (six) hours as needed for up to 3 days.   Vitamin D-3 25 MCG (1000 UT) Caps Take 1,000 Units by mouth daily.        Review of Systems  Constitutional:  Positive for appetite change and fatigue. Negative for fever.  HENT:  Positive for hearing loss. Negative for congestion, sore throat, trouble swallowing and voice change.        Hearing aids.   Eyes:  Negative for visual disturbance.       S/p inj, macular degeneration  Respiratory:  Positive for cough, shortness of breath and wheezing.        DOE, chronic hacking cough. Left lower chest/lung pain with deep breathing or coughing.   Cardiovascular:  Negative for leg swelling.  Gastrointestinal:  Negative for abdominal pain and constipation.  Genitourinary:  Positive for frequency.       Every  2-3 hours at night  Musculoskeletal:  Positive for arthralgias and gait problem.  Skin:  Negative for color change.  Neurological:  Negative for weakness and light-headedness.       Tingling numbness in hands.   Psychiatric/Behavioral:  Negative for behavioral problems and sleep disturbance. The patient is not  nervous/anxious.    Immunization History  Administered Date(s) Administered   Fluad Quad(high Dose 65+) 12/10/2018   Influenza Split 12/10/2012   Influenza, High Dose Seasonal PF 12/10/2019   Influenza-Unspecified 12/11/2013, 11/28/2014, 12/08/2015, 12/04/2016, 11/27/2017, 12/10/2019, 12/16/2020   Moderna SARS-COV2 Booster Vaccination 01/06/2020   Moderna Sars-Covid-2 Vaccination 03/03/2019, 03/31/2019   PFIZER(Purple Top)SARS-COV-2 Vaccination 07/27/2020, 11/16/2020   Pneumococcal Conjugate-13 01/06/2014, 01/08/2014   Pneumococcal Polysaccharide-23 04/20/2009   Tdap 07/03/2000, 05/02/2011   Zoster, Live 10/20/2009   Pertinent  Health Maintenance Due  Topic Date Due   INFLUENZA VACCINE  Completed   DEXA SCAN  Addressed   Fall Risk 01/25/2021 01/25/2021 01/25/2021 01/26/2021 01/27/2021  Falls in the past year? - - - - -  Was there an injury with Fall? - - - - -  Fall Risk Category Calculator - - - - -  Fall Risk Category - - - - -  Patient Fall Risk Level High fall risk High fall risk High fall risk High fall risk High fall risk  Patient at Risk for Falls Due to - - - - -   Functional Status Survey:    Vitals:   01/27/21 1557  BP: 118/60  Pulse: 80  Resp: 20  Temp: (!) 96.8 F (36 C)  SpO2: 96%   There is no height or weight on file to calculate BMI. Physical Exam Vitals and nursing note reviewed.  Constitutional:      Comments: fatigue  HENT:     Head: Normocephalic and atraumatic.     Mouth/Throat:     Mouth: Mucous membranes are dry.  Eyes:     Extraocular Movements: Extraocular movements intact.     Conjunctiva/sclera: Conjunctivae normal.     Pupils:  Pupils are equal, round, and reactive to light.     Comments: Mild ectropion R+L lower eyelids  Cardiovascular:     Rate and Rhythm: Normal rate and regular rhythm.     Heart sounds: Murmur heard.  Pulmonary:     Breath sounds: Wheezing, rhonchi and rales present.     Comments: SOB is apparent. Left mid to lower lung rales and wheezes.  Chest:     Chest wall: No tenderness.  Abdominal:     General: Bowel sounds are normal.     Palpations: Abdomen is soft.     Tenderness: There is no abdominal tenderness.  Musculoskeletal:     Cervical back: Normal range of motion and neck supple.     Right lower leg: Edema present.     Left lower leg: Edema present.     Comments: Fullness left popliteal area. Edema BLE mostly in the right ankle.   Skin:    General: Skin is warm and dry.     Findings: Bruising present.     Comments: Mild chronic venous insufficiency skin changes BLE. A golf ball sized, hard, irregular, orange peel skin appearance left breast.  Opted out workup and tx. A bruise dorsum right hand from previous fall.   Neurological:     General: No focal deficit present.     Mental Status: She is alert and oriented to person, place, and time. Mental status is at baseline.     Motor: No weakness.     Coordination: Coordination normal.     Gait: Gait abnormal.  Psychiatric:        Mood and Affect: Mood normal.        Behavior: Behavior normal.        Thought Content: Thought  content normal.        Judgment: Judgment normal.    Labs reviewed: Recent Labs    06/10/20 0835 09/06/20 0934 12/21/20 0000 01/24/21 2225  NA 144 141 140 136  K 4.4 4.1 3.9 5.8*  CL 107 105 102 100  CO2 23 27 25* 17*  GLUCOSE 85 77  --  101*  BUN 32* 34* 35* 99*  CREATININE 1.27* 1.23* 1.3* 3.34*  CALCIUM 9.0 9.0  --  8.9   Recent Labs    06/10/20 0835 09/06/20 0934 12/21/20 0000 01/24/21 2225  AST 13 14 12* 182*  ALT 12 13 19  229*  ALKPHOS  --   --  98 147*  BILITOT 0.6 0.5  --  2.5*   PROT 6.0* 6.0*  --  6.8  ALBUMIN  --   --  3.3* 3.3*   Recent Labs    12/21/20 0000 01/24/21 1808  WBC 8.5 28.0*  NEUTROABS 5,763.00 24.3*  HGB 13.0 14.9  HCT 39 45.4  MCV  --  88.0  PLT 192 200   Lab Results  Component Value Date   TSH 2.764 01/24/2021   No results found for: HGBA1C Lab Results  Component Value Date   CHOL 143 07/22/2019   HDL 62 07/22/2019   LDLCALC 64 07/22/2019   TRIG 91 07/22/2019   CHOLHDL 2.3 07/22/2019    Significant Diagnostic Results in last 30 days:  DG Chest Port 1 View  Result Date: 01/24/2021 CLINICAL DATA:  Shortness of breath, weakness. EXAM: PORTABLE CHEST 1 VIEW COMPARISON:  Chest x-ray dated 07/21/2008. FINDINGS: Ill-defined airspace opacities at the bilateral lung bases. Additional interstitial prominence within the lower lung zones bilaterally, RIGHT greater than LEFT. Probable small bilateral pleural effusions. No pneumothorax is seen. Borderline cardiomegaly. LEFT chest wall pacemaker/ICD apparatus in place. Osseous structures about the chest are unremarkable. IMPRESSION: 1. Ill-defined airspace opacities at the bilateral lung bases, compatible with multifocal pneumonia and/or asymmetric pulmonary edema. 2. Probable small bilateral pleural effusions. 3. Borderline cardiomegaly. Electronically Signed   By: Franki Cabot M.D.   On: 01/24/2021 21:50   CT CHEST ABDOMEN PELVIS WO CONTRAST  Result Date: 01/25/2021 CLINICAL DATA:  Abdominal distension EXAM: CT CHEST, ABDOMEN AND PELVIS WITHOUT CONTRAST TECHNIQUE: Multidetector CT imaging of the chest, abdomen and pelvis was performed following the standard protocol without IV contrast. COMPARISON:  08/08/2006 FINDINGS: CT CHEST FINDINGS Cardiovascular: Calcific aortic atherosclerosis. Left chest wall pacemaker. No pericardial effusion. Mediastinum/Nodes: Multilobular mass of the left breast measuring 3.7 x 4.2 cm. There is no mediastinal or axillary lymphadenopathy. Lungs/Pleura: Bibasilar  interstitial opacity with small right pleural effusion. Musculoskeletal: No acute osseous abnormality. CT ABDOMEN PELVIS FINDINGS Hepatobiliary: No focal liver abnormality is seen. No gallstones, gallbladder wall thickening, or biliary dilatation. Pancreas: Unremarkable. No pancreatic ductal dilatation or surrounding inflammatory changes. Spleen: Normal in size without focal abnormality. Adrenals/Urinary Tract: Normal adrenal glands. Bilateral renal atrophy with multiple cysts. Stomach/Bowel: There is rectosigmoid diverticulosis without inflammation. No small bowel dilatation. Appendix is normal. Nondistended stomach with small hiatal hernia. Vascular/Lymphatic: Aortic atherosclerosis. No enlarged abdominal or pelvic lymph nodes. Reproductive: Uterus is normal.  No adnexal mass. Other: No abdominal wall hernia or abnormality. No abdominopelvic ascites. Musculoskeletal: No acute or significant osseous findings. IMPRESSION: 1. No acute abnormality of the chest, abdomen or pelvis. 2. Multilobular mass of the left breast, concerning for breast carcinoma. Correlation with mammography and ultrasound are recommended as an outpatient. 3. Bibasilar interstitial opacity with small right pleural effusion, likely  pulmonary edema. Aortic Atherosclerosis (ICD10-I70.0). Electronically Signed   By: Ulyses Jarred M.D.   On: 01/25/2021 00:42    Assessment/Plan: Pneumonia involving left lung Hospitalized 01/24/21-01/26/21, PNA-CT chest/abdomen/pelvis with bibasilar interstitial opacity with small right effusion-treated with antibiotics.  Hypoxia Hypoxic respiratory failure, due to CHF/PNA, on O2, prn Oxycodone available. Morphine, Lorazepam prn available to her. Dc Vit D, Ocuvite. Hospice service for comfort measures.   Congestive heart failure (CHF) (HCC) CHF/Edema BLE-not apparent, off Furosemide. Elevated BNP 632 01/24/21 CT with pulmonary edema.   Elevated liver enzymes Elevate liver enzymes AST 182, ALT 229 total  bilirubin 2.5 01/24/21  Essential hypertension Blood pressure is controlled, on Metoprolol. Bun/creat 35/1.3 12/21/20  Hypothyroidism  stable, on Levothyroxine 53mcg qd. TSH 2.764 01/23/21  AKI (acute kidney injury) ([place]) Bun/creat K 5.8, Bun 99, creat 3.34 01/24/21  Pacemaker  AVB Mobitz 2, s/p pacemaker.   PAF (paroxysmal atrial fibrillation) (HCC) Heart rate is in control, continue Metoprolol, avoid anticoagulation   Left breast mass eft breast multilobular mass on CT. Hx of breast cancer, s/p lumpectomy 2017 Dr. Excell Seltzer  Leukocytosis Sepsis.     Family/ staff Communication: plan of care reviewed with the patient and charge nurse.   Labs/tests ordered:  none   Time spend 40 minutes.

## 2021-01-27 NOTE — Assessment & Plan Note (Signed)
stable, on Levothyroxine 61mcg qd. TSH 2.764 01/23/21

## 2021-01-30 LAB — CULTURE, BLOOD (ROUTINE X 2)
Culture: NO GROWTH
Culture: NO GROWTH
Special Requests: ADEQUATE
Special Requests: ADEQUATE

## 2021-02-27 DEATH — deceased

## 2021-03-08 ENCOUNTER — Encounter (INDEPENDENT_AMBULATORY_CARE_PROVIDER_SITE_OTHER): Payer: Medicare Other | Admitting: Ophthalmology
